# Patient Record
Sex: Female | Born: 1985 | Race: White | Hispanic: No | Marital: Single | Smoking: Current every day smoker
Health system: Southern US, Community
[De-identification: ages and names within clinical notes are randomized; demographics above are authoritative.]

## PROBLEM LIST (undated history)

## (undated) DIAGNOSIS — R569 Unspecified convulsions: Secondary | ICD-10-CM

## (undated) DIAGNOSIS — F1410 Cocaine abuse, uncomplicated: Secondary | ICD-10-CM

## (undated) DIAGNOSIS — I10 Essential (primary) hypertension: Secondary | ICD-10-CM

## (undated) DIAGNOSIS — B182 Chronic viral hepatitis C: Secondary | ICD-10-CM

## (undated) DIAGNOSIS — R011 Cardiac murmur, unspecified: Secondary | ICD-10-CM

## (undated) HISTORY — PX: NO PAST SURGERIES: SHX2092

---
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
## 2003-01-02 ENCOUNTER — Other Ambulatory Visit: Admission: RE | Admit: 2003-01-02 | Discharge: 2003-01-02 | Payer: Self-pay | Admitting: Obstetrics and Gynecology

## 2003-02-21 ENCOUNTER — Emergency Department (HOSPITAL_COMMUNITY): Admission: EM | Admit: 2003-02-21 | Discharge: 2003-02-22 | Payer: Self-pay | Admitting: Emergency Medicine

## 2003-02-21 ENCOUNTER — Encounter: Payer: Self-pay | Admitting: Emergency Medicine

## 2003-02-22 ENCOUNTER — Encounter: Payer: Self-pay | Admitting: Emergency Medicine

## 2003-04-27 ENCOUNTER — Encounter: Payer: Self-pay | Admitting: Emergency Medicine

## 2003-04-27 ENCOUNTER — Emergency Department (HOSPITAL_COMMUNITY): Admission: EM | Admit: 2003-04-27 | Discharge: 2003-04-27 | Payer: Self-pay | Admitting: Emergency Medicine

## 2003-08-14 ENCOUNTER — Other Ambulatory Visit: Admission: RE | Admit: 2003-08-14 | Discharge: 2003-08-14 | Payer: Self-pay | Admitting: Obstetrics and Gynecology

## 2003-10-16 ENCOUNTER — Inpatient Hospital Stay (HOSPITAL_COMMUNITY): Admission: AD | Admit: 2003-10-16 | Discharge: 2003-10-16 | Payer: Self-pay | Admitting: Obstetrics and Gynecology

## 2003-11-25 ENCOUNTER — Inpatient Hospital Stay (HOSPITAL_COMMUNITY): Admission: AD | Admit: 2003-11-25 | Discharge: 2003-11-25 | Payer: Self-pay | Admitting: Obstetrics and Gynecology

## 2003-12-29 ENCOUNTER — Inpatient Hospital Stay (HOSPITAL_COMMUNITY): Admission: AD | Admit: 2003-12-29 | Discharge: 2003-12-29 | Payer: Self-pay | Admitting: Obstetrics and Gynecology

## 2004-01-07 ENCOUNTER — Ambulatory Visit (HOSPITAL_COMMUNITY): Admission: RE | Admit: 2004-01-07 | Discharge: 2004-01-07 | Payer: Self-pay | Admitting: Obstetrics and Gynecology

## 2004-01-22 ENCOUNTER — Inpatient Hospital Stay (HOSPITAL_COMMUNITY): Admission: AD | Admit: 2004-01-22 | Discharge: 2004-01-22 | Payer: Self-pay | Admitting: Obstetrics and Gynecology

## 2004-02-04 ENCOUNTER — Inpatient Hospital Stay (HOSPITAL_COMMUNITY): Admission: AD | Admit: 2004-02-04 | Discharge: 2004-02-04 | Payer: Self-pay | Admitting: Obstetrics and Gynecology

## 2004-02-14 ENCOUNTER — Inpatient Hospital Stay (HOSPITAL_COMMUNITY): Admission: AD | Admit: 2004-02-14 | Discharge: 2004-02-14 | Payer: Self-pay | Admitting: Obstetrics and Gynecology

## 2004-02-17 ENCOUNTER — Inpatient Hospital Stay (HOSPITAL_COMMUNITY): Admission: AD | Admit: 2004-02-17 | Discharge: 2004-02-20 | Payer: Self-pay | Admitting: Obstetrics and Gynecology

## 2004-03-20 ENCOUNTER — Other Ambulatory Visit: Admission: RE | Admit: 2004-03-20 | Discharge: 2004-03-20 | Payer: Self-pay | Admitting: Obstetrics and Gynecology

## 2004-07-01 ENCOUNTER — Emergency Department (HOSPITAL_COMMUNITY): Admission: EM | Admit: 2004-07-01 | Discharge: 2004-07-01 | Payer: Self-pay | Admitting: Emergency Medicine

## 2004-07-04 ENCOUNTER — Emergency Department (HOSPITAL_COMMUNITY): Admission: EM | Admit: 2004-07-04 | Discharge: 2004-07-04 | Payer: Self-pay | Admitting: Emergency Medicine

## 2004-07-07 ENCOUNTER — Encounter: Payer: Self-pay | Admitting: Emergency Medicine

## 2004-07-07 ENCOUNTER — Emergency Department (HOSPITAL_COMMUNITY): Admission: EM | Admit: 2004-07-07 | Discharge: 2004-07-07 | Payer: Self-pay | Admitting: Emergency Medicine

## 2004-09-06 ENCOUNTER — Emergency Department (HOSPITAL_COMMUNITY): Admission: EM | Admit: 2004-09-06 | Discharge: 2004-09-06 | Payer: Self-pay | Admitting: Emergency Medicine

## 2004-09-08 ENCOUNTER — Emergency Department (HOSPITAL_COMMUNITY): Admission: EM | Admit: 2004-09-08 | Discharge: 2004-09-08 | Payer: Self-pay | Admitting: Emergency Medicine

## 2004-09-11 ENCOUNTER — Emergency Department (HOSPITAL_COMMUNITY): Admission: EM | Admit: 2004-09-11 | Discharge: 2004-09-11 | Payer: Self-pay | Admitting: Family Medicine

## 2005-01-05 ENCOUNTER — Emergency Department (HOSPITAL_COMMUNITY): Admission: EM | Admit: 2005-01-05 | Discharge: 2005-01-05 | Payer: Self-pay | Admitting: Emergency Medicine

## 2005-01-05 ENCOUNTER — Other Ambulatory Visit: Admission: RE | Admit: 2005-01-05 | Discharge: 2005-01-05 | Payer: Self-pay | Admitting: Obstetrics and Gynecology

## 2005-02-22 ENCOUNTER — Observation Stay (HOSPITAL_COMMUNITY): Admission: AD | Admit: 2005-02-22 | Discharge: 2005-02-23 | Payer: Self-pay | Admitting: Obstetrics and Gynecology

## 2005-04-02 ENCOUNTER — Ambulatory Visit (HOSPITAL_COMMUNITY): Admission: RE | Admit: 2005-04-02 | Discharge: 2005-04-02 | Payer: Self-pay | Admitting: Obstetrics and Gynecology

## 2005-06-20 IMAGING — US US ABDOMEN COMPLETE
1 series · 14 of 25 positions shown · non-contrast
Comparison: none

CLINICAL DATA: 18-year-old with right lower quadrant pain for one month.  Patient is 11 weeks pregnant.
 ABDOMEN ULTRASOUND:
TECHNIQUE: Complete abdominal ultrasound examination was performed including evaluation of the liver, gallbladder, bile ducts, pancreas, kidneys, spleen, IVC, and abdominal aorta.

[Series 1: us abdomen complete · 0.39mm/px · 14 of 58 slices shown]
[im 1/58]
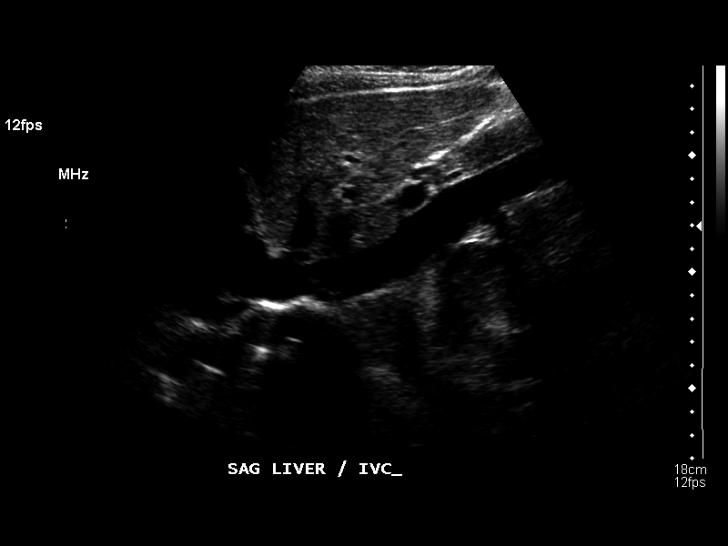
[im 5/58]
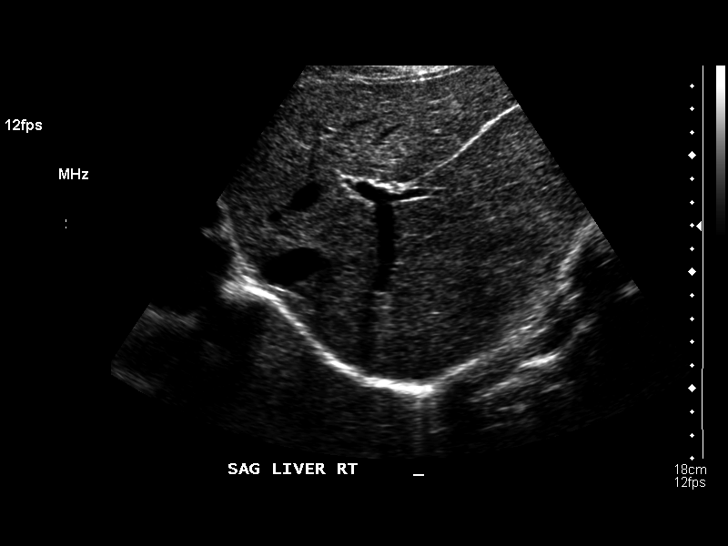
[im 10/58]
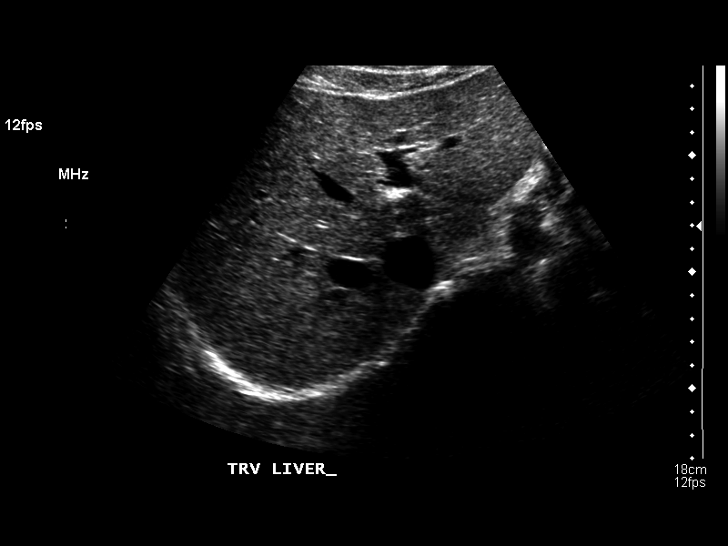
[im 15/58]
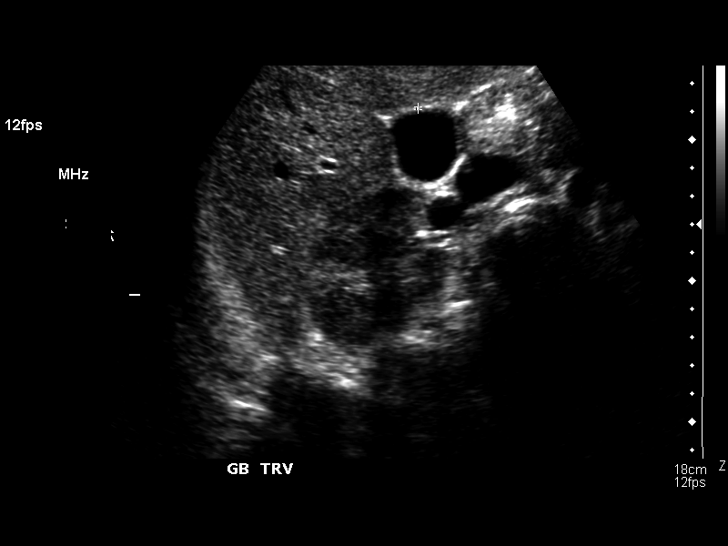
[im 20/58]
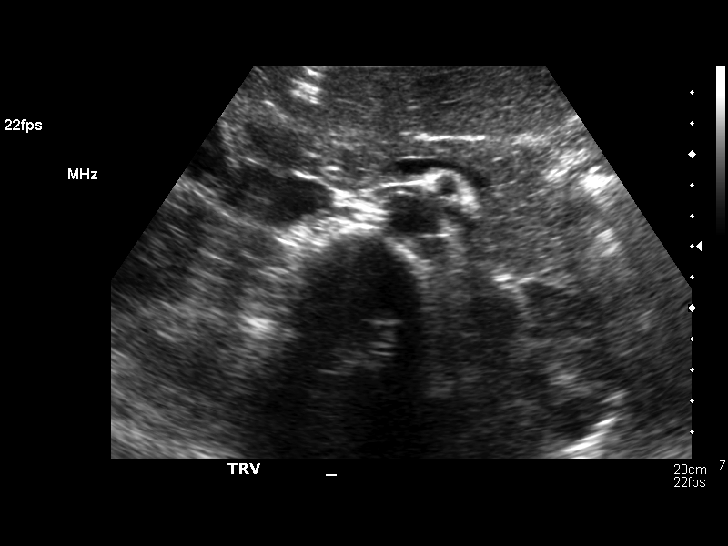
[im 22/58]
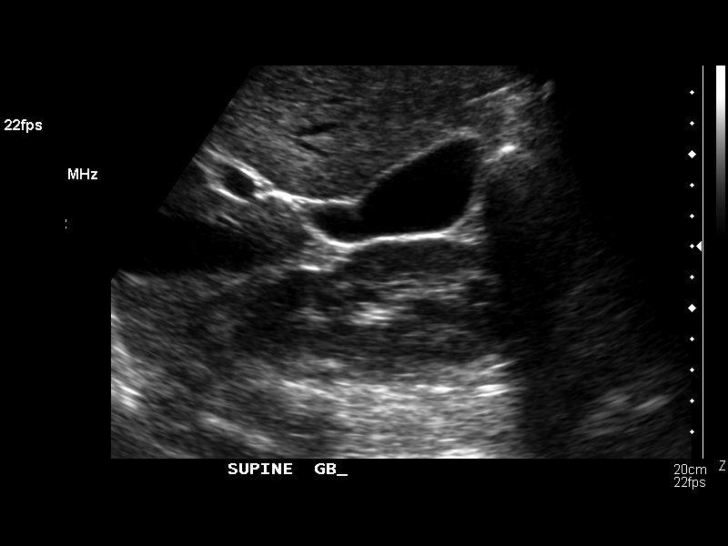
[im 27/58]
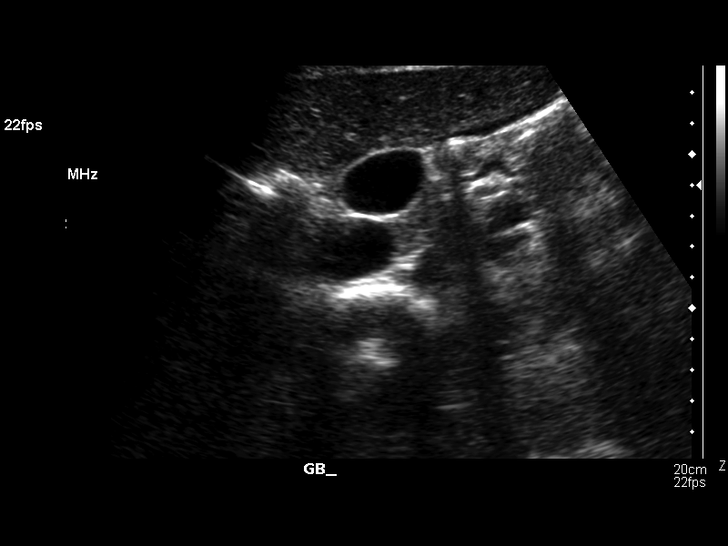
[im 31/58]
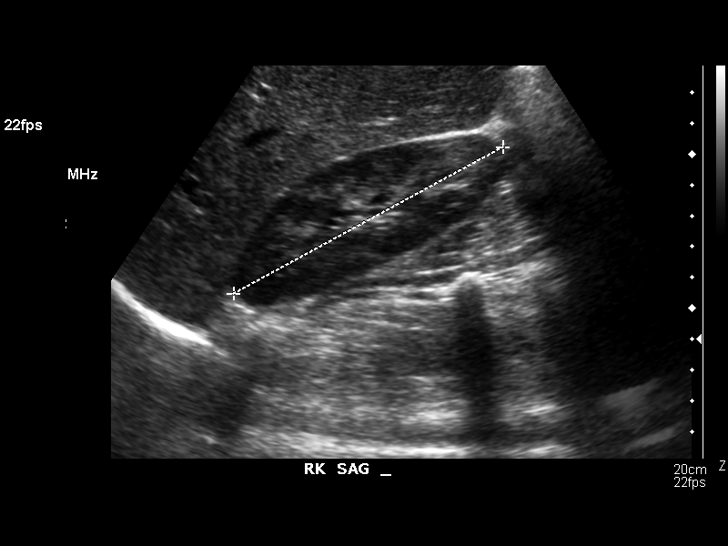
[im 36/58]
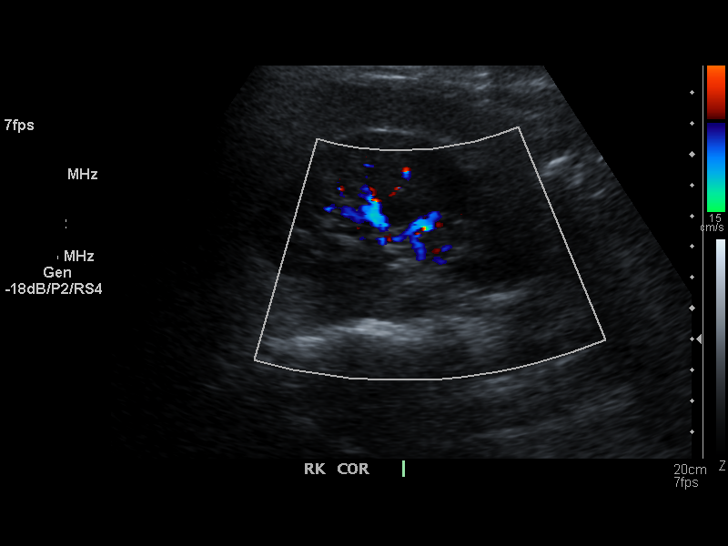
[im 39/58]
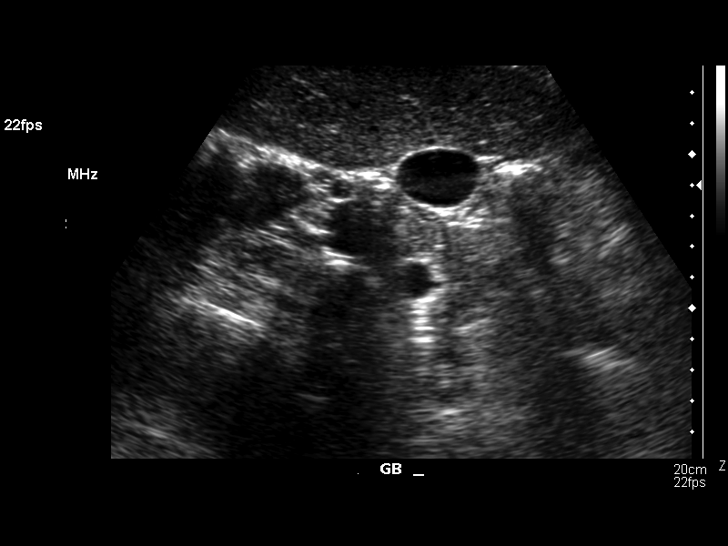
[im 43/58]
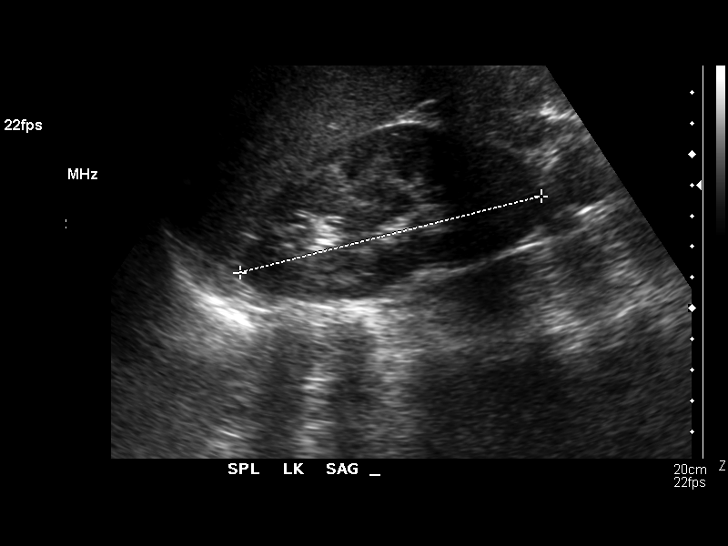
[im 48/58]
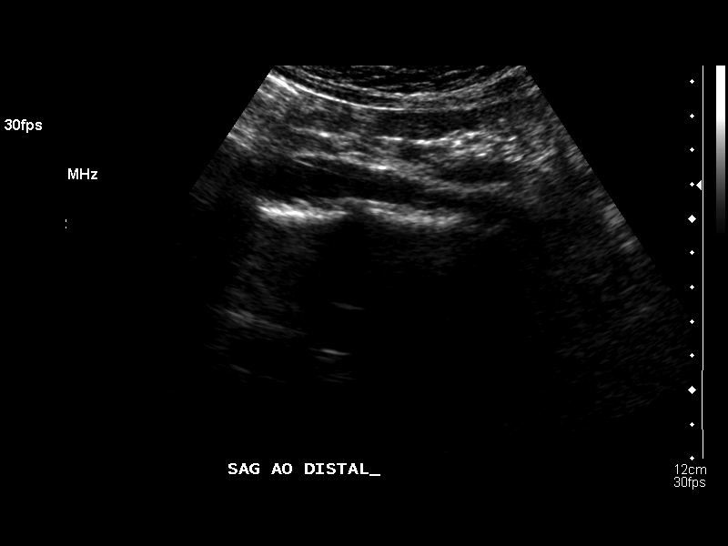
[im 53/58]
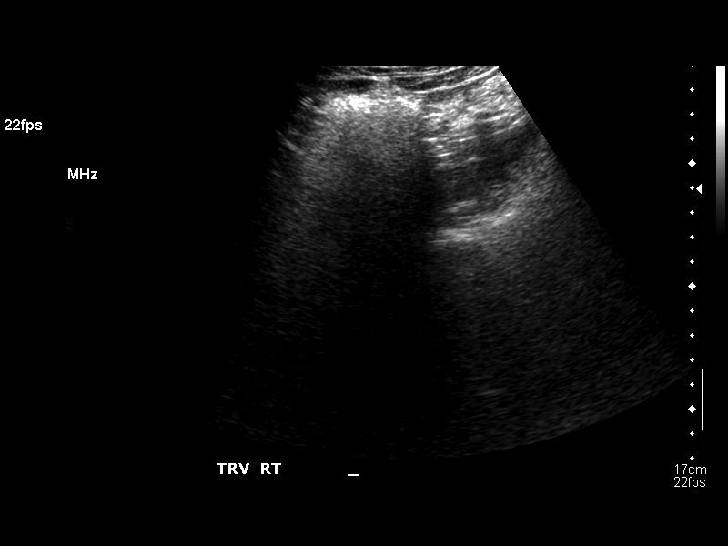
[im 58/58]
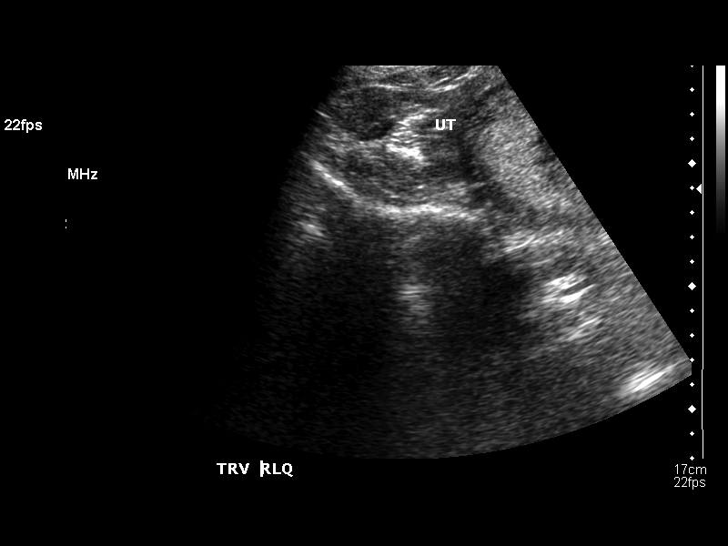

[14 of 25 positions shown; findings below may reference images not displayed]

FINDINGS: There is no evidence of gallstones or biliary ductal dilatation.  The liver is within normal limits in echogenicity, and no focal liver lesions are seen.  The visualized portions of the IVC and pancreas are unremarkable.
 There is no evidence of splenomegaly.  The kidneys are unremarkable, and there is no evidence of hydronephrosis.  The abdominal aorta is non-dilated.
 Transabdominal evaluation of the right lower quadrant is unremarkable.  The gravid uterus was not evaluated.
IMPRESSION: Negative abdominal ultrasound.

## 2005-07-16 ENCOUNTER — Other Ambulatory Visit: Admission: RE | Admit: 2005-07-16 | Discharge: 2005-07-16 | Payer: Self-pay | Admitting: Obstetrics and Gynecology

## 2005-08-17 ENCOUNTER — Inpatient Hospital Stay (HOSPITAL_COMMUNITY): Admission: AD | Admit: 2005-08-17 | Discharge: 2005-08-17 | Payer: Self-pay | Admitting: Obstetrics and Gynecology

## 2005-10-02 ENCOUNTER — Inpatient Hospital Stay (HOSPITAL_COMMUNITY): Admission: AD | Admit: 2005-10-02 | Discharge: 2005-10-02 | Payer: Self-pay | Admitting: Obstetrics and Gynecology

## 2005-10-05 ENCOUNTER — Inpatient Hospital Stay (HOSPITAL_COMMUNITY): Admission: AD | Admit: 2005-10-05 | Discharge: 2005-10-06 | Payer: Self-pay | Admitting: Obstetrics and Gynecology

## 2005-11-26 ENCOUNTER — Other Ambulatory Visit: Admission: RE | Admit: 2005-11-26 | Discharge: 2005-11-26 | Payer: Self-pay | Admitting: Obstetrics and Gynecology

## 2006-06-17 ENCOUNTER — Emergency Department (HOSPITAL_COMMUNITY): Admission: EM | Admit: 2006-06-17 | Discharge: 2006-06-17 | Payer: Self-pay | Admitting: Emergency Medicine

## 2006-09-27 ENCOUNTER — Emergency Department (HOSPITAL_COMMUNITY): Admission: EM | Admit: 2006-09-27 | Discharge: 2006-09-27 | Payer: Self-pay | Admitting: Family Medicine

## 2006-10-08 ENCOUNTER — Emergency Department (HOSPITAL_COMMUNITY): Admission: EM | Admit: 2006-10-08 | Discharge: 2006-10-08 | Payer: Self-pay | Admitting: Family Medicine

## 2006-12-04 ENCOUNTER — Emergency Department (HOSPITAL_COMMUNITY): Admission: EM | Admit: 2006-12-04 | Discharge: 2006-12-04 | Payer: Self-pay | Admitting: Emergency Medicine

## 2006-12-28 ENCOUNTER — Inpatient Hospital Stay (HOSPITAL_COMMUNITY): Admission: AD | Admit: 2006-12-28 | Discharge: 2006-12-28 | Payer: Self-pay | Admitting: Obstetrics & Gynecology

## 2006-12-28 ENCOUNTER — Ambulatory Visit: Payer: Self-pay | Admitting: Family Medicine

## 2007-02-06 ENCOUNTER — Emergency Department (HOSPITAL_COMMUNITY): Admission: EM | Admit: 2007-02-06 | Discharge: 2007-02-06 | Payer: Self-pay | Admitting: Family Medicine

## 2007-03-12 ENCOUNTER — Emergency Department (HOSPITAL_COMMUNITY): Admission: EM | Admit: 2007-03-12 | Discharge: 2007-03-12 | Payer: Self-pay | Admitting: Emergency Medicine

## 2007-05-08 ENCOUNTER — Emergency Department (HOSPITAL_COMMUNITY): Admission: EM | Admit: 2007-05-08 | Discharge: 2007-05-08 | Payer: Self-pay | Admitting: Emergency Medicine

## 2007-05-30 ENCOUNTER — Emergency Department (HOSPITAL_COMMUNITY): Admission: EM | Admit: 2007-05-30 | Discharge: 2007-05-31 | Payer: Self-pay | Admitting: *Deleted

## 2007-06-06 ENCOUNTER — Emergency Department (HOSPITAL_COMMUNITY): Admission: EM | Admit: 2007-06-06 | Discharge: 2007-06-06 | Payer: Self-pay | Admitting: Emergency Medicine

## 2007-07-05 ENCOUNTER — Emergency Department (HOSPITAL_COMMUNITY): Admission: EM | Admit: 2007-07-05 | Discharge: 2007-07-05 | Payer: Self-pay | Admitting: Family Medicine

## 2007-08-10 ENCOUNTER — Emergency Department (HOSPITAL_COMMUNITY): Admission: EM | Admit: 2007-08-10 | Discharge: 2007-08-10 | Payer: Self-pay | Admitting: Emergency Medicine

## 2007-09-19 ENCOUNTER — Inpatient Hospital Stay (HOSPITAL_COMMUNITY): Admission: AD | Admit: 2007-09-19 | Discharge: 2007-09-19 | Payer: Self-pay | Admitting: Obstetrics and Gynecology

## 2007-09-22 IMAGING — CR DG WRIST 2V*R*
1 series · 1 of 1 positions shown · non-contrast
Comparison: none

CLINICAL DATA: Right wrist pain for a week.  
 RIGHT WRIST ? 2 VIEW:

[view not recorded]
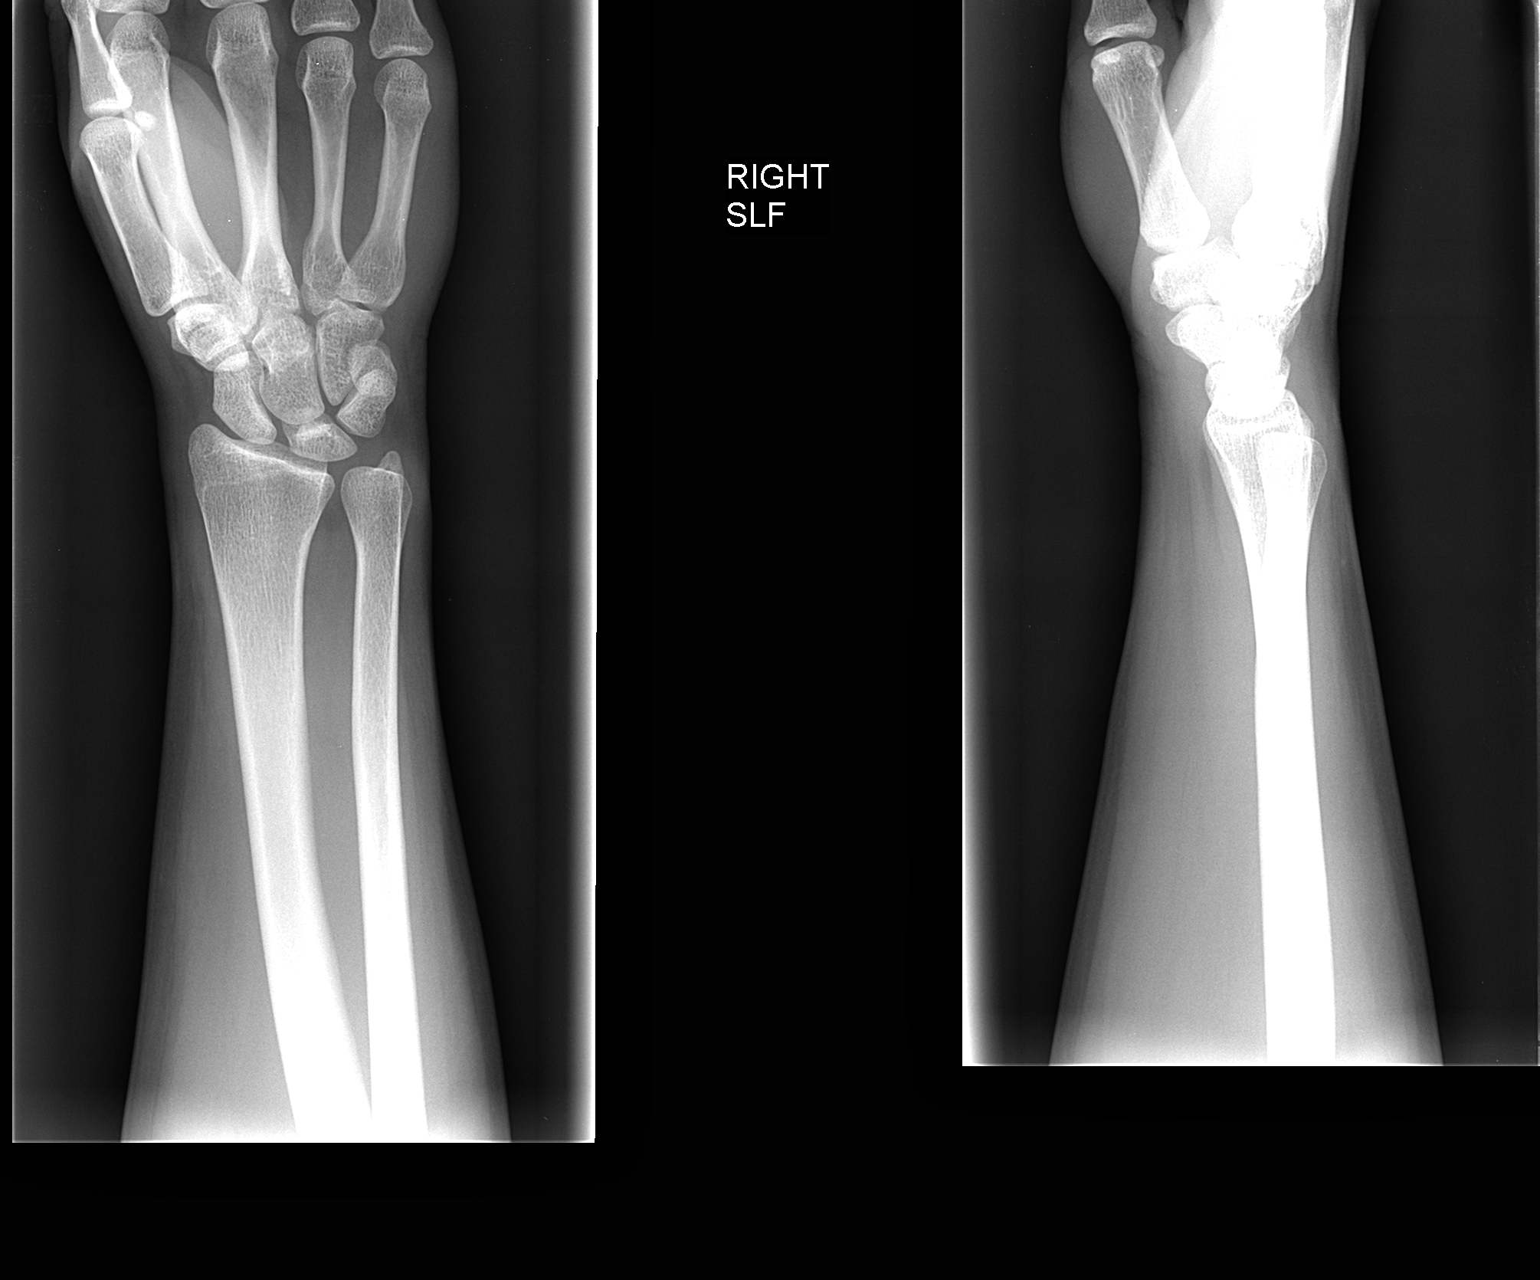

[1 of 1 positions shown; findings below may reference images not displayed]

FINDINGS: Two views of the right wrist show no acute abnormality.  The radiocarpal joint space appears normal and the carpal bones are in normal position.
IMPRESSION: Negative.

## 2007-12-19 ENCOUNTER — Ambulatory Visit (HOSPITAL_COMMUNITY): Admission: RE | Admit: 2007-12-19 | Discharge: 2007-12-19 | Payer: Self-pay | Admitting: Obstetrics and Gynecology

## 2008-02-05 ENCOUNTER — Encounter: Payer: Self-pay | Admitting: Obstetrics and Gynecology

## 2008-02-05 ENCOUNTER — Inpatient Hospital Stay (HOSPITAL_COMMUNITY): Admission: AD | Admit: 2008-02-05 | Discharge: 2008-02-05 | Payer: Self-pay | Admitting: Obstetrics and Gynecology

## 2008-03-03 ENCOUNTER — Inpatient Hospital Stay (HOSPITAL_COMMUNITY): Admission: AD | Admit: 2008-03-03 | Discharge: 2008-03-03 | Payer: Self-pay | Admitting: Obstetrics and Gynecology

## 2008-04-24 IMAGING — US US OB DETAIL+14 WK
1 series · 14 of 28 positions shown · non-contrast
Comparison: none

OBSTETRICAL ULTRASOUND:
 This ultrasound was performed in The [HOSPITAL], and the [facility] report will be stored to [REDACTED] PACS.

[Series 1: us ob detail+14 wk · 14 of 85 slices shown]
[im 4/85]
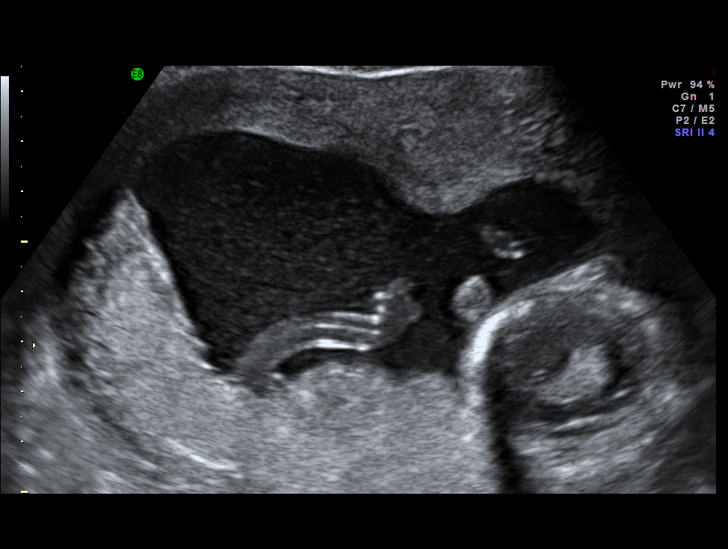
[im 10/85]
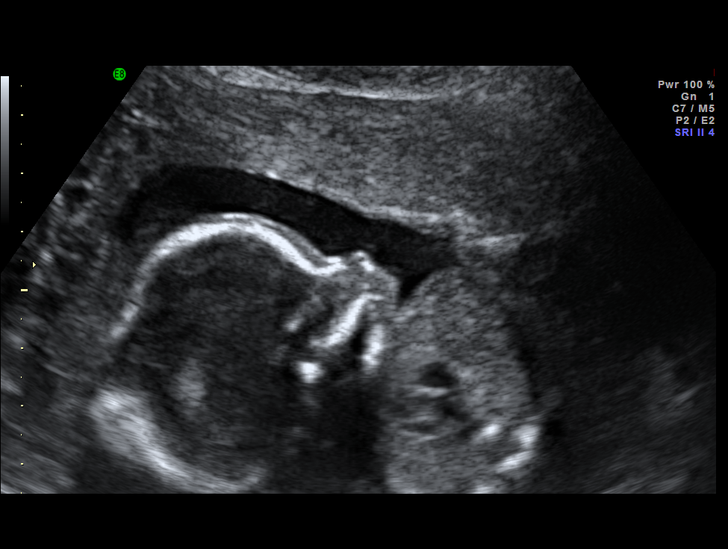
[im 16/85]
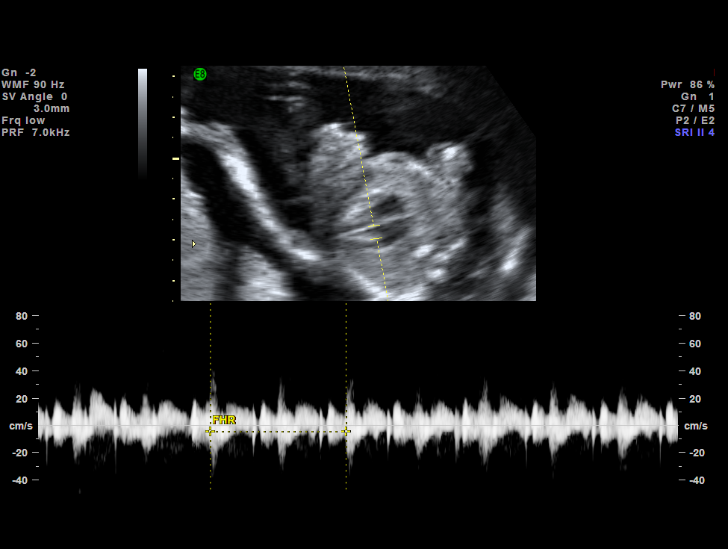
[im 22/85]
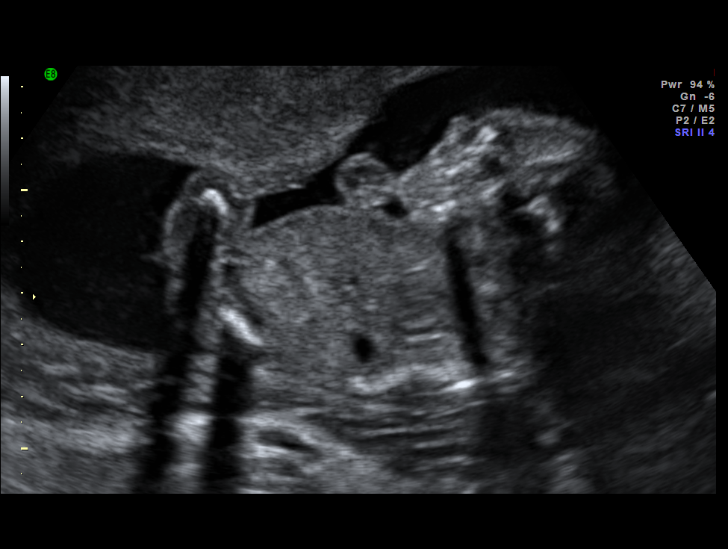
[im 29/85]
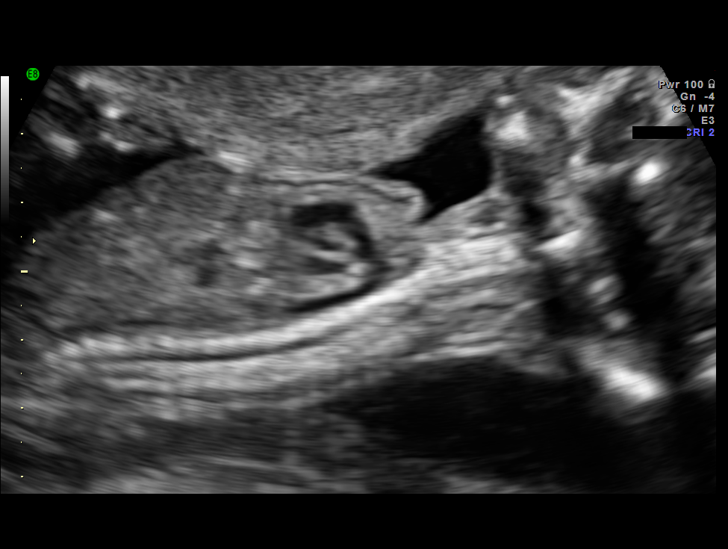
[im 35/85]
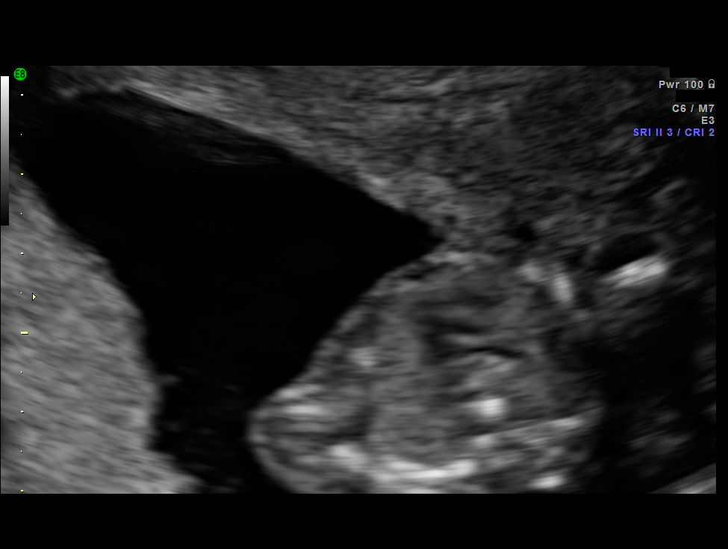
[im 41/85]
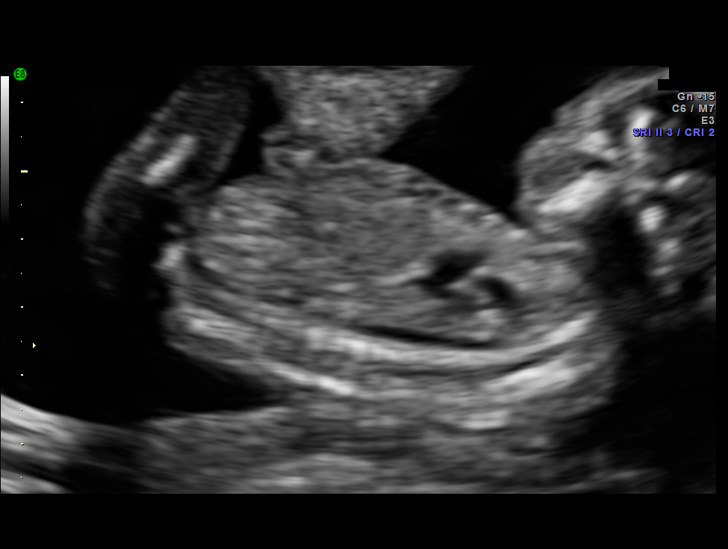
[im 47/85]
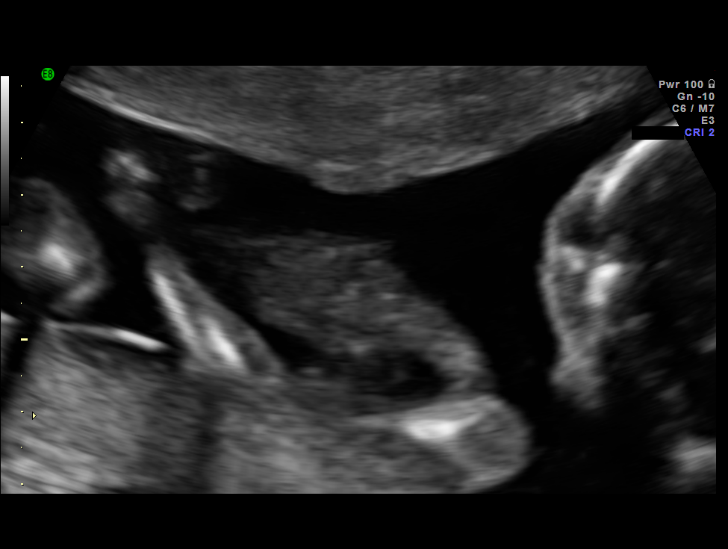
[im 53/85]
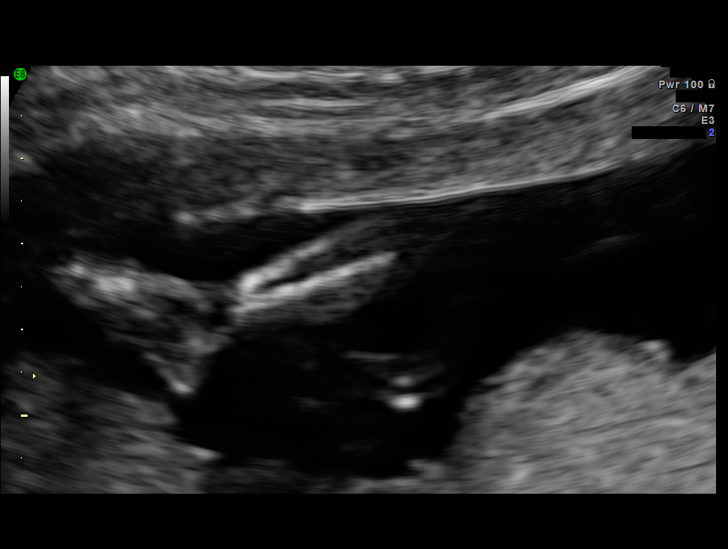
[im 60/85]
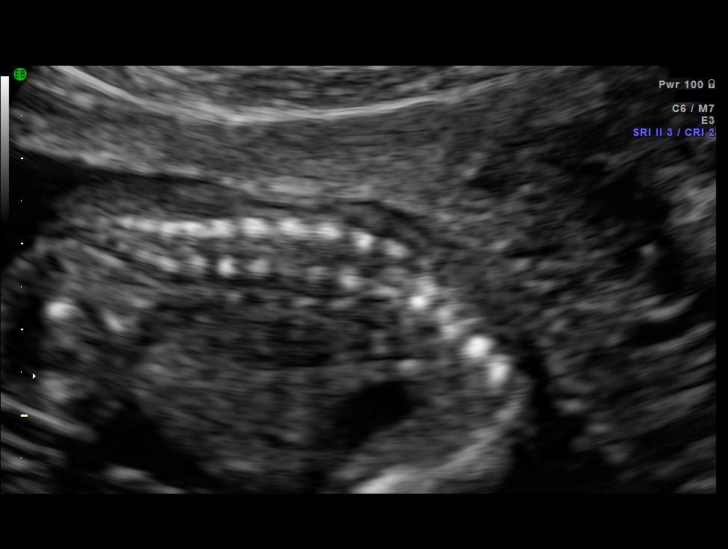
[im 66/85]
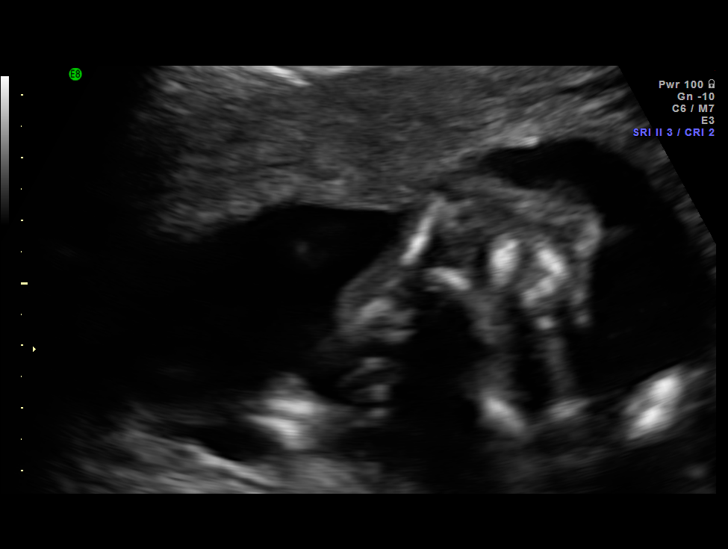
[im 72/85]
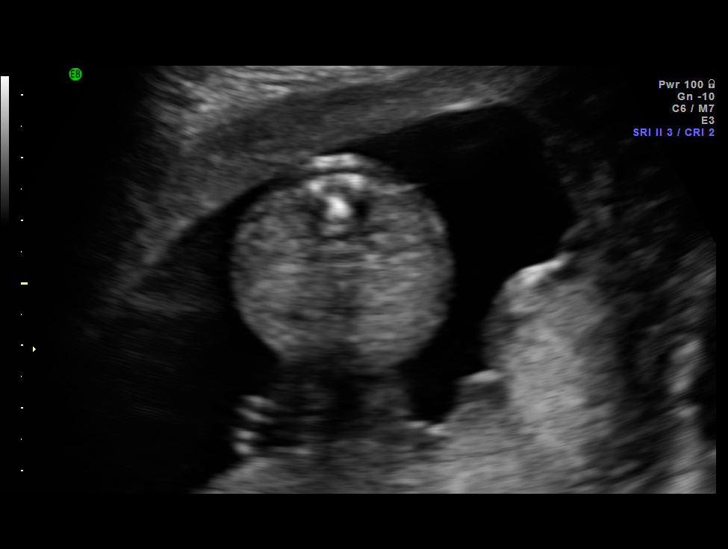
[im 78/85]
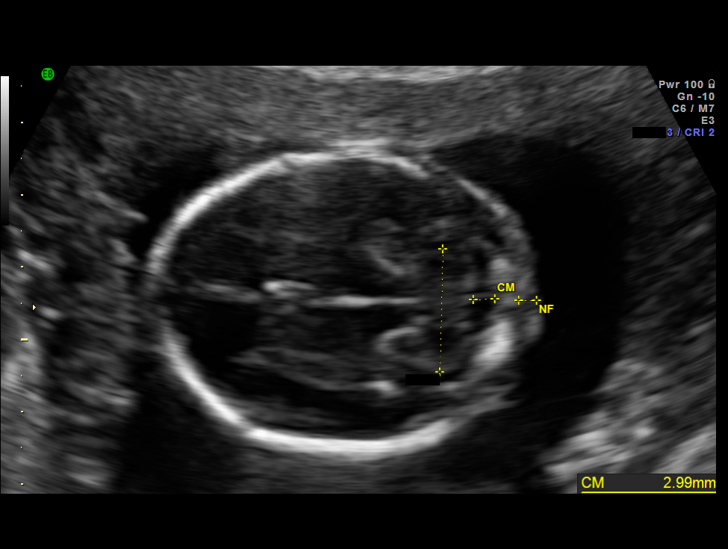
[im 85/85]
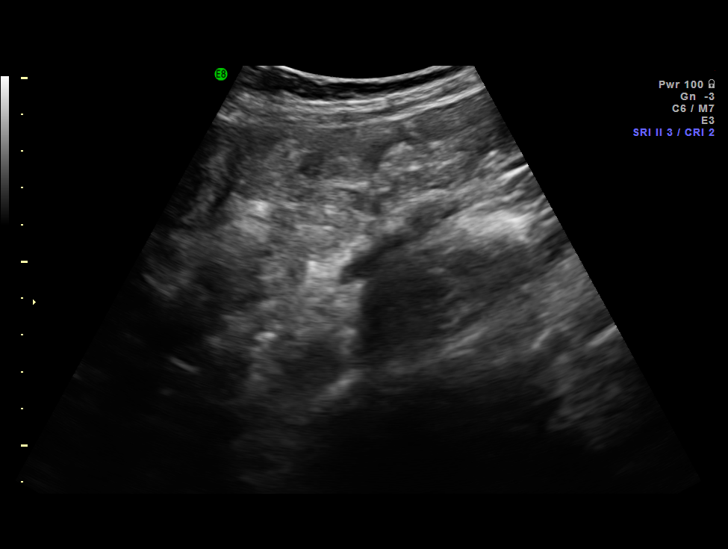

[14 of 28 positions shown; findings below may reference images not displayed]

IMPRESSION: [facility] has also been faxed to the ordering physician.

## 2008-06-09 ENCOUNTER — Inpatient Hospital Stay (HOSPITAL_COMMUNITY): Admission: AD | Admit: 2008-06-09 | Discharge: 2008-06-09 | Payer: Self-pay | Admitting: Obstetrics and Gynecology

## 2008-06-29 ENCOUNTER — Inpatient Hospital Stay (HOSPITAL_COMMUNITY): Admission: AD | Admit: 2008-06-29 | Discharge: 2008-07-02 | Payer: Self-pay | Admitting: Obstetrics and Gynecology

## 2009-12-11 ENCOUNTER — Emergency Department (HOSPITAL_BASED_OUTPATIENT_CLINIC_OR_DEPARTMENT_OTHER): Admission: EM | Admit: 2009-12-11 | Discharge: 2009-12-11 | Payer: Self-pay | Admitting: Emergency Medicine

## 2010-11-15 NOTE — L&D Delivery Note (Signed)
Delivery Note At 2:40 AM a viable female was delivered via  (Presentation:LOA ;  ).  APGAR: , ; weight 5-5.   Placenta status: spont, .  Cord:3 vc  with the following complications:none .    Anesthesia:  epidural Episiotomy: none Lacerations: none Suture Repair:none Est. Blood Loss350 (mL):   Mom to postpartum.  Baby to nursery-stable.  [name] 08/09/2011, 2:50 AM

## 2011-02-03 ENCOUNTER — Inpatient Hospital Stay (HOSPITAL_COMMUNITY)
Admission: AD | Admit: 2011-02-03 | Discharge: 2011-02-03 | Disposition: A | Discharge status: Home / Self Care | Payer: Self-pay | Source: Ambulatory Visit | Attending: Obstetrics and Gynecology | Admitting: Obstetrics and Gynecology

## 2011-02-03 DIAGNOSIS — O210 Mild hyperemesis gravidarum: Secondary | ICD-10-CM

## 2011-02-03 LAB — URINALYSIS, ROUTINE W REFLEX MICROSCOPIC
Glucose, UA: NEGATIVE mg/dL
Protein, ur: NEGATIVE mg/dL
pH: 6.5 (ref 5.0–8.0)

## 2011-03-24 ENCOUNTER — Observation Stay: Payer: Self-pay

## 2011-03-30 NOTE — Discharge Summary (Signed)
[name], [name] NO.:  [id number]   MEDICAL RECORD NO.:  [id number]          PATIENT TYPE:  INP   LOCATION:  9127                          FACILITY:  [facility]   PHYSICIAN:  Malachi Pro. Ambrose Mantle, M.D. DATE OF BIRTH:  Aug 15, 1986   DATE OF ADMISSION:  06/30/2008  DATE OF DISCHARGE:  07/02/2008                               DISCHARGE SUMMARY   A 25 year old white single female para 2-0-3-2, gravida 27, EDC July 08, 2008, admitted in early labor.  Blood group and type A positive,  negative antibody, nonreactive serology, rubella immune, hepatitis B  surface antigen negative, HIV negative, GC and chlamydia negative,  cystic fibrosis negative, and group B strep negative.  Vaginal  ultrasound on December 08, 2007, crown-rump length measured 9 weeks and 4  days, [facility] July 08, 2008, with increased nuchal translucency.  Ultrasound at [facility] on December 19, 2007, showed a  normal nuchal translucency.  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  She has been on Valtrex since 36 weeks with  no outbreaks.  She began contracting on June 29, 2008, and came here  for evaluation.  She was admitted with the cervix 3 cm and made no  progress.  She received Pitocin and an epidural.  She was 4 cm at 6:28  a.m. and then has spontaneous rupture of membranes and progress rapidly  to full diltation.   Allergies to AMOXICILLIN; causes diarrhea.   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   OPERATIONS:  None.   Alcohol, tobacco, and drugs none.   FAMILY HISTORY:  Mother with COPD, diabetes, and epilepsy.  Father with  high blood pressure, depression, and alcoholism.  Maternal grandmother  with high blood pressure, heart disease, uterine cancer, and thyroid  problem.   OBSTETRIC HISTORY:  In 2003 and 2004, the patient has spontaneous  abortions.  In April 2005, she had a 5-pound 13-ounce female with 36+  weeks after preterm  premature rupture of membranes.  In November 2006,  she delivered a 6-pound 8-ounce female at 65 weeks with transposition of  the great vessels.  In 2008, she had a spontaneous abortion.   Physical exam on admission revealed vital signs to include a blood  pressure of 143/98; otherwise they were normal.  Heart and lungs were  normal.  The abdomen was soft and nontender.  Fetal heart tone were  normal.  The cervix was 10 cm.  Vertex at a +1 station.   The patient did have hypokalemia.  She had normal PIH labs other than  the low potassium.  She pushed well and delivered spontaneously OA with  an intact perineum by Dr. Ambrose Mantle, living female infant 6 pounds 5 ounces  with Apgars of 9 at 1 and 9 at 5 minutes.  Placenta was intact.  Uterus  was normal.  Blood loss about 400 mL.  There were no lacerations.  Postpartum, the patient did quite well and was ready for discharge on  the second postpartum day.   Initial hemoglobin was 10.6, hematocrit 32.0,  white count 17,800, and  platelet count 374,000.  Followup hemoglobin 8.7, hematocrit 26.1.  Potassium was 2.8 on admission, and 3.2 at discharge.  Urinalysis was  negative.  No proteinuria was found.  SGOT and PT were 19 and 10.  LDH  was 187.  Uric acid 3.9.  RPR was nonreactive.   FINAL DIAGNOSES:  1. Intrauterine pregnancy at 39 weeks, delivered occipitoanterior.  2. Hypokalemia.   OPERATION:  Spontaneous delivery occipitoanterior.   FINAL CONDITION:  Improved.   INSTRUCTIONS:  Include a regular discharge instruction booklet.  The  patient is advised to return to the office in 6 weeks for followup  examination.  Percocet 5/325 30 tablets 1 every 4-6 hours as needed for  pain and Motrin 600 mg 30 tablets 1 every 6 hours as needed for pain.  The patient is also given a prescription for K-Tab 1 by mouth 3 times a  day for 2 weeks.  She is to return to the office in 6 weeks.      Malachi Pro. Ambrose Mantle, M.D.  Electronically Signed      TFH/MEDQ  D:  07/02/2008  T:  07/02/2008  Job:  [id number]

## 2011-04-02 LAB — ABO/RH: RH Type: POSITIVE

## 2011-04-02 LAB — GC/CHLAMYDIA PROBE AMP, GENITAL
Chlamydia: NEGATIVE
Gonorrhea: NEGATIVE

## 2011-04-02 LAB — HIV ANTIBODY (ROUTINE TESTING W REFLEX): HIV: NONREACTIVE

## 2011-04-02 LAB — HEPATITIS B SURFACE ANTIGEN: Hepatitis B Surface Ag: NEGATIVE
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
## 2011-04-02 NOTE — Discharge Summary (Signed)
NAME:  [name], [name] NO.:  [id number]   MEDICAL RECORD NO.:  [id number]          PATIENT TYPE:  INP   LOCATION:  9114                          [facility]   PHYSICIAN:  Malachi Pro. Ambrose Mantle, M.D. DATE OF BIRTH:  [birth year]   DATE OF ADMISSION:  10/05/2005  DATE OF DISCHARGE:  10/06/2005                                 DISCHARGE SUMMARY   A 25 year old white single female para 0-1-2-1, gravida 4, EDC October 19, 2005 by 7-week ultrasound admitted in early labor.  Blood group and type A+.  Negative antibody.  Nonreactive serology.  Rubella immune.  Hepatitis B  surface antigen negative.  HIV negative.  GC and Chlamydia negative.  Pap  smear low-grade SIL.  One-hour Glucola 98.  Group B Strep negative.  Vaginal  ultrasound on March 03, 2005 crown rump length 0.99 cm, 7 weeks 0 days, [facility]  October 19, 2005.  Patient was treated with Flexeril at 11 weeks.  She also  received Phenergan, Zyrtec, and Zantac in early pregnancy.  On September 16, 2005 [facility] laboratories were normal.  Blood pressure has remained high.  She  was seen in the office on the day of admission.  Cervix was 1-2 cm.  Later  on the day of admission she returned with possible rupture of membranes.  Crist Fat was negative but the cervix was 3-4 cm.   PAST MEDICAL HISTORY:  Allergy to AMOXICILLIN, caused diarrhea.   ILLNESSES:  1.  HPV.  2.  CIN I-II.  3.  History of seizures not confirmed by neurologist report.  4.  High blood pressure.   OPERATIONS:  None.   FAMILY HISTORY:  Mother with diabetes, neurologic problem.  Maternal  grandparents with heart disease.  Father with high blood pressure and  depression.   OBSTETRIC HISTORY:  In 2003 and 2004 spontaneous abortions.  In April of  2005 a 5 pound 13 ounce female at 36+ weeks with preterm premature rupture  of the membranes and high blood pressure.   PHYSICAL EXAMINATION:  VITAL SIGNS:  On admission her blood pressure was  140/103, but later was  141/85.  Temperature was 98.3, pulse 110,  respirations 18.  HEART:  Normal sinus tachycardia, no murmurs.  LUNGS:  Clear to auscultation.  ABDOMEN:  Soft.  Fundal height 37 cm.  Fetal heart tones normal.  PELVIC:  Cervix 4 cm, 70%, vertex at a -2.  Artificial rupture of the  membranes produced clear fluid.   Patient received an epidural, then made no progress so Pitocin was begun.  At 9:32 p.m. the Pitocin was at 6 milliunits a minute.  Contractions were  every two minutes.  Epidural was very effective.  Cervix was 5+ cm, 100%.  I  was in the hospital signing charts in the record room when I heard an  overhead page for me.  I came immediately to the delivery room but the baby  was already delivered by the nurse.  My beeper was on vibrate but I did not  feel a vibration.  I delivered the placenta, repaired a first degree  laceration, and palpated  the uterus and rectum.  There were no  abnormalities.  Cord was preserved for banking.  Blood loss about 400 mL.  Infant was female 6 pounds 8 ounces.  Apgars were given by the nurses at 9 at  one and 9 at five minutes.  Shortly after arriving in the nursery, however,  the baby's O2 saturation was quite low and it was transferred to the NICU  where it was intubated, kept on 100% oxygen initially.  Echocardiogram  showed transposition of the great vessels.  The baby has been transferred to  [facility].  The patient wants to be discharged.  There are two items  that are of significance as far as her health goes.  Her blood pressure is  159/99.  However, she has a history of high blood pressure and she has  normal PIH laboratories.  Her hemoglobin 10.8 on admission, hematocrit 33.3,  white count 15,400, platelet 453,000.  Follow-up hematocrits were 28.5 and  24.5.  Potassium on admission was 2.3.  After receiving some IV potassium  she is at 2.8 on the morning of discharge.  Her SGOT and PT were 20 and 11.  Total bilirubin 0.6, creatinine 0.5.   RPR nonreactive.   FINAL DIAGNOSES:  1.  Intrauterine pregnancy at 38 weeks delivered vertex.  2.  Anemia.  3.  High blood pressure.  4.  Hypopotassemia.   OPERATION:  1.  Spontaneous delivery vertex.  2.  Repair of first degree laceration.   The patient is discharged on potassium supplementation.  She is advised to  take iron to help build her blood count back to normal.  She is to report  either to [facility] or here if she develops severe headache, severe  epigastric pain, or any problems that could be related to high blood  pressure.  The patient is to increase her potassium supplementation from two  tablets a day to three tablets a day and return to the office in five days  for follow-up examination.      Malachi Pro. Ambrose Mantle, M.D.  Electronically Signed     TFH/MEDQ  D:  10/06/2005  T:  10/06/2005  Job:  [id number]

## 2011-04-02 NOTE — H&P (Signed)
NAMEGUYLA, BLESS NO.:  [id number]   MEDICAL RECORD NO.:  [id number]          PATIENT TYPE:  INP   LOCATION:  9319                          [facility]ACILITY[facility]  [facility]   PHYSICIAN:  Lorre Munroe., M.D.DATE OF BIRTH:  05/28/1986   DATE OF ADMISSION:  02/23/2005  DATE OF DISCHARGE:                                HISTORY & PHYSICAL   CHIEF COMPLAINT:  Abdominal pain.   HISTORY OF PRESENT ILLNESS:  This is a generally healthy 25 year old white  female who is about six weeks pregnant and had acute onset of upper and mid  abdominal pain approximately 24 hours ago.  During the day yesterday, she  had progressive anorexia, nausea and increasing pain.  The pain subsequently  settled in the right lower quadrant of the abdomen, has remained there and  has remained severe.  She has not had diarrhea.  She has not had a definite  fever.  She said she did not break out in a sweat, but she felt that was due  to the pain.  She has confirmed intrauterine pregnancy by ultrasound and  beta HCG.  She has never had a similar pain or GI upset in the past.  No one  else that she knows has similar illness.  White count was 15,500 with a  marked left shift.  There is no abnormality of urinalysis noted.  Amylase  and lipase normal.  Dr. Ambrose Mantle saw the patient and said he felt there was no  obvious GYN or obstetric cause for her pain, asked me to see her thinking  she might have appendicitis.   PAST MEDICAL HISTORY:  1.  Generally good health.  2.  History of seizures, exact cause never determined.  3.  No other serious chronic ailments.   MEDICATIONS:  She is not on chronic medications.   SOCIAL HISTORY:  She smokes about a half a pack of cigarettes a day.  She  has not been drinking alcohol lately.  There is a history of pyelonephritis  in the past.  She has never been operated on.  She is allergic to  penicillin.  That medicine causes hives.  No chronic medications at this  time.   FAMILY HISTORY:  Unremarkable.   CHILDHOOD ILLNESSES:  Unremarkable.   REVIEW OF SYSTEMS:  Negative for any cardiac or pulmonary symptoms.  Negative for GI symptoms other than at this time.   PHYSICAL EXAMINATION:  VITAL SIGNS:  Temperature and vital signs  unremarkable as recorded by nursing staff.  GENERAL APPEARANCE:  No acute distress.  HEENT:  Unremarkable.  No icterus.  No thyromegaly.  No neck mass.  CHEST:  Clear to auscultation.  HEART:  Regular rate and rhythm.  No murmurs, rubs or gallops.  ABDOMEN:  Nondistended, soft in all four quadrants.  Tender in the right  lower quadrant with subjective but rebound tenderness, but no guarding or  spasm of the muscles.  No mass.  No hernia.  Bowel sounds are normal, quite  active.  PELVIC:  Not done.  EXTREMITIES:  Normal.  No edema.  Good pulses.  SKIN:  No lesions are noted.  LYMPH NODES:  None enlarged in the groin or the neck.   IMPRESSION:  Possible acute appendicitis.  I do not think she is quite  tender enough to be certain that it is appendicitis.   PLAN:  Because of very early pregnancy, I would like to avoid a CT scan.  I  would also like to avoid operating on her, but if the tenderness and pain  persist, she will have laparoscopy in a few hours.  I will observe her in  the hospital, keeping her n.p.o. and get CBC in a few hours.  Unless there  is improvement in the white count and the symptoms on exam, will proceed to  laparoscopy and appendectomy.      WB/MEDQ  D:  02/23/2005  T:  02/23/2005  Job:  [id number]   cc:   Malachi Pro. Ambrose Mantle, M.D.  [address]  [address]  [place]  [place] [address]  Fax: [phone]

## 2011-08-08 ENCOUNTER — Encounter (HOSPITAL_COMMUNITY): Payer: Self-pay | Admitting: *Deleted

## 2011-08-08 ENCOUNTER — Inpatient Hospital Stay (HOSPITAL_COMMUNITY)
Admission: AD | Admit: 2011-08-08 | Discharge: 2011-08-11 | DRG: 775 | Disposition: A | Discharge status: Home / Self Care | Payer: Medicaid Other | Source: Ambulatory Visit | Attending: Obstetrics & Gynecology | Admitting: Obstetrics & Gynecology

## 2011-08-08 HISTORY — DX: Essential (primary) hypertension: I10

## 2011-08-08 LAB — RAPID URINE DRUG SCREEN, HOSP PERFORMED
Amphetamines: NOT DETECTED
Opiates: NOT DETECTED

## 2011-08-08 LAB — CBC
Platelets: 299 10*3/uL (ref 150–400)
RBC: 3.71 MIL/uL — ABNORMAL LOW (ref 3.87–5.11)
WBC: 12.2 10*3/uL — ABNORMAL HIGH (ref 4.0–10.5)

## 2011-08-08 LAB — URINALYSIS, ROUTINE W REFLEX MICROSCOPIC
Glucose, UA: NEGATIVE mg/dL
Hgb urine dipstick: NEGATIVE
Ketones, ur: 15 mg/dL — AB
Protein, ur: 100 mg/dL — AB
Specific Gravity, Urine: >1.03 — ABNORMAL HIGH (ref 1.005–1.030)
pH: 6 (ref 5.0–8.0)

## 2011-08-08 LAB — URINE MICROSCOPIC-ADD ON

## 2011-08-08 MED ORDER — FLUTICASONE PROPIONATE 50 MCG/ACT NA SUSP
2.0000 | Freq: Every day | NASAL | Status: DC
  Administered 2011-08-09: 2 via NASAL
  Filled 2011-08-08 (×4): qty 320000

## 2011-08-08 MED ORDER — ONDANSETRON HCL 4 MG/2ML IJ SOLN: 4.0000 mg | Freq: Four times a day (QID) | INTRAMUSCULAR | Status: DC | PRN

## 2011-08-08 MED ORDER — LIDOCAINE HCL (PF) 1 % IJ SOLN
30.0000 mL | INTRAMUSCULAR | Status: DC | PRN
  Filled 2011-08-08: qty 30

## 2011-08-08 MED ORDER — IBUPROFEN 600 MG PO TABS: 600.0000 mg | ORAL_TABLET | Freq: Four times a day (QID) | ORAL | Status: DC | PRN

## 2011-08-08 MED ORDER — LACTATED RINGERS IV SOLN
INTRAVENOUS | Status: DC
  Administered 2011-08-08 – 2011-08-09 (×2): via INTRAVENOUS

## 2011-08-08 MED ORDER — LACTATED RINGERS IV SOLN: 500.0000 mL | INTRAVENOUS | Status: DC | PRN

## 2011-08-08 MED ORDER — FLEET ENEMA 7-19 GM/118ML RE ENEM: 1.0000 | ENEMA | RECTAL | Status: DC | PRN

## 2011-08-08 MED ORDER — NALBUPHINE SYRINGE 5 MG/0.5 ML: 10.0000 mg | INJECTION | INTRAMUSCULAR | Status: DC | PRN

## 2011-08-08 MED ORDER — OXYTOCIN 20 UNITS IN LACTATED RINGERS INFUSION - SIMPLE
125.0000 mL/h | Freq: Once | INTRAVENOUS | Status: DC
  Filled 2011-08-08: qty 1000

## 2011-08-08 MED ORDER — OXYCODONE-ACETAMINOPHEN 5-325 MG PO TABS: 2.0000 | ORAL_TABLET | ORAL | Status: DC | PRN

## 2011-08-08 MED ORDER — ATENOLOL 25 MG PO TABS
25.0000 mg | ORAL_TABLET | Freq: Every day | ORAL | Status: DC
  Administered 2011-08-09 – 2011-08-10 (×2): 25 mg via ORAL
  Filled 2011-08-08 (×4): qty 1

## 2011-08-08 MED ORDER — TERBUTALINE SULFATE 1 MG/ML IJ SOLN: 0.2500 mg | Freq: Once | INTRAMUSCULAR | Status: AC | PRN

## 2011-08-08 MED ORDER — OXYTOCIN BOLUS FROM INFUSION
500.0000 mL | Freq: Once | INTRAVENOUS | Status: DC
  Filled 2011-08-08: qty 500

## 2011-08-08 MED ORDER — CITRIC ACID-SODIUM CITRATE 334-500 MG/5ML PO SOLN: 30.0000 mL | ORAL | Status: DC | PRN

## 2011-08-08 MED ORDER — ACETAMINOPHEN 325 MG PO TABS: 650.0000 mg | ORAL_TABLET | ORAL | Status: DC | PRN

## 2011-08-08 MED ORDER — PRENATAL PLUS 27-1 MG PO TABS
1.0000 | ORAL_TABLET | Freq: Every day | ORAL | Status: DC
  Administered 2011-08-09 – 2011-08-10 (×2): 1 via ORAL
  Filled 2011-08-08 (×2): qty 1

## 2011-08-08 MED ORDER — OXYTOCIN 20 UNITS IN LACTATED RINGERS INFUSION - SIMPLE
1.0000 m[IU]/min | INTRAVENOUS | Status: DC
  Administered 2011-08-08: 2 m[IU]/min via INTRAVENOUS

## 2011-08-08 NOTE — ED Provider Notes (Signed)
History   In with limited PNC, no care since 33 wks. States care at [facility] co. C/O abd pain and questionable leaking of fluid.  Chief Complaint  Patient presents with  . Contractions   HPI  OB History    Grav Para Term Preterm Abortions TAB SAB Ect Mult Living   6 3 3  2  2   3       Past Medical History  Diagnosis Date  . Hypertension     History reviewed. No pertinent past surgical history.  No family history on file.  History  Substance Use Topics  . Smoking status: Former Smoker -- 0.2 packs/day    Types: Cigarettes    Quit date: 07/08/2011  . Smokeless tobacco: Not on file  . Alcohol Use: No    Allergies:  Allergies  Allergen Reactions  . Penicillins Cross Reactors Hives, Nausea And Vomiting and Swelling    Prescriptions prior to admission  Medication Sig Dispense Refill  . atenolol (TENORMIN) 25 MG tablet Take 25 mg by mouth daily.        . fluticasone (FLONASE) 50 MCG/ACT nasal spray Place 2 sprays into the nose daily.        Marland Kitchen HYDROcodone-acetaminophen (NORCO) 5-325 MG per tablet Take 1 tablet by mouth every 6 (six) hours as needed. For back pain       . prenatal vitamin w/FE, FA (PRENATAL 1 + 1) 27-1 MG TABS Take 1 tablet by mouth daily.          Review of Systems  Constitutional: Negative.   HENT: Positive for congestion.   Eyes: Negative.   Respiratory: Positive for cough.   Cardiovascular: Negative.   Gastrointestinal: Negative.   Genitourinary: Negative.   Musculoskeletal: Negative.   Skin: Negative.   Neurological: Negative.   Endo/Heme/Allergies: Negative.   Psychiatric/Behavioral: Negative.    Physical Exam   Blood pressure 134/87, pulse 123, temperature 98.1 F (36.7 C), resp. rate 22, last menstrual period 11/09/2010, SpO2 97.00%.  Physical Exam  Constitutional: She is oriented to person, place, and time. She appears well-developed and well-nourished.  Cardiovascular: Normal rate, regular rhythm and normal heart sounds.     Respiratory: Effort normal and breath sounds normal.  GI: Soft. Bowel sounds are normal.  Genitourinary: Vagina normal and uterus normal.  Musculoskeletal: Normal range of motion.  Neurological: She is alert and oriented to person, place, and time. She has normal reflexes.  Skin: Skin is warm and dry.  Psychiatric: She has a normal mood and affect. Her behavior is normal. Judgment and thought content normal.    MAU Course  Procedures  MDM    Assessment and Plan  Will observe and if cervical change noted will admit in labor  [name] 08/08/2011, 8:30 PM

## 2011-08-08 NOTE — Progress Notes (Signed)
CONNY SITU is a 25 y.o. [phone] at [redacted]w[redacted]d by ultrasound admitted for active labor  Subjective:   Objective: BP 139/84  Pulse 95  Temp(Src) 98.1 F (36.7 C) (Oral)  Resp 20  Ht 5' (1.524 m)  Wt 76.204 kg (168 lb)  BMI 32.81 kg/m2  SpO2 97%  LMP 11/09/2010      FHT:  FHR: 140 bpm, variability: moderate,  accelerations:  Present,  decelerations:  Absent UC:   regular, every 3-4 minutes SVE:   Dilation: 3.5 Effacement (%): 70 Station: -2 Exam by:: D.Jodean Valade,CNM  Labs: Lab Results  Component Value Date   WBC 12.2* 08/08/2011   HGB 11.0* 08/08/2011   HCT 32.8* 08/08/2011   MCV 88.4 08/08/2011   PLT 299 08/08/2011    Assessment / Plan: Augmentation of labor, progressing well  Labor: progressing on pit. Preeclampsia:  no signs or symptoms of toxicity Fetal Wellbeing:  Category I Pain Control:  Labor support without medications I/D:  n/a Anticipated MOD:  NSVD  [name] 08/08/2011, 11:37 PM

## 2011-08-08 NOTE — Progress Notes (Signed)
Inadequate uc's will start pit aug.

## 2011-08-08 NOTE — Progress Notes (Signed)
SVE no change in cervix, vtx presentation.

## 2011-08-08 NOTE — H&P (Signed)
[name] is a 25 y.o. female presenting for labor. Maternal Medical History:  Reason for admission: Reason for admission: contractions.  Contractions: Onset was 3-5 hours ago.   Frequency: regular.   Perceived severity is strong.    Fetal activity: Perceived fetal activity is normal.   Last perceived fetal movement was within the past hour.      OB History    Grav Para Term Preterm Abortions TAB SAB Ect Mult Living   6 3 3  2  2   3      Past Medical History  Diagnosis Date  . Hypertension    History reviewed. No pertinent past surgical history. Family History: family history is not on file. Social History:  reports that she quit smoking about 4 weeks ago. Her smoking use included Cigarettes. She smoked .25 packs per day. She does not have any smokeless tobacco history on file. She reports that she does not drink alcohol or use illicit drugs.  Review of Systems  Constitutional: Negative.   HENT: Positive for congestion.   Eyes: Negative.   Respiratory: Positive for cough.   Cardiovascular: Negative.   Gastrointestinal: Negative.   Genitourinary: Negative.   Musculoskeletal: Negative.   Skin: Negative.   Neurological: Negative.   Endo/Heme/Allergies: Negative.   Psychiatric/Behavioral: Negative.     Dilation: 2.5 Effacement (%): Thick Station: Ballotable Exam by:: D Sumner Boesch Blood pressure 134/87, pulse 123, temperature 98.1 F (36.7 C), resp. rate 22, last menstrual period 11/09/2010, SpO2 97.00%. Maternal Exam:  Uterine Assessment: Contraction strength is mild.  Contraction frequency is regular.   Abdomen: Patient reports no abdominal tenderness. Fetal presentation: vertex  Introitus: Normal vulva.   Physical Exam  Constitutional: She is oriented to person, place, and time. She appears well-developed and well-nourished.  HENT:  Head: Normocephalic.  Eyes: Pupils are equal, round, and reactive to light.  Neck: Normal range of motion.  Cardiovascular:  Normal rate, regular rhythm, normal heart sounds and intact distal pulses.   Respiratory: Effort normal and breath sounds normal.  GI: Soft. Bowel sounds are normal.  Genitourinary: Vagina normal and uterus normal.  Musculoskeletal: Normal range of motion.  Neurological: She is alert and oriented to person, place, and time. She has normal reflexes.  Skin: Skin is warm and dry.  Psychiatric: She has a normal mood and affect. Her behavior is normal. Judgment and thought content normal.    Prenatal labs: ABO, Rh:   Antibody:   Rubella:   RPR:    HBsAg:    HIV:    GBS:     Assessment/Plan: admit   [name] 08/08/2011, 8:43 PM

## 2011-08-08 NOTE — Progress Notes (Signed)
Pt wheeled directly to room 6. Wheelchair linens saturated with clear fluid. Pt reports LOF @ 18:30 and UC's q 5 min.

## 2011-08-08 NOTE — Progress Notes (Signed)
SVE 3-4/70/-1. Change noted will admit.

## 2011-08-09 ENCOUNTER — Inpatient Hospital Stay (HOSPITAL_COMMUNITY): Payer: Medicaid Other | Admitting: Anesthesiology

## 2011-08-09 ENCOUNTER — Encounter (HOSPITAL_COMMUNITY): Payer: Self-pay | Admitting: Anesthesiology

## 2011-08-09 ENCOUNTER — Encounter (HOSPITAL_COMMUNITY): Payer: Self-pay | Admitting: *Deleted

## 2011-08-09 LAB — RPR: RPR Ser Ql: NONREACTIVE

## 2011-08-09 MED ORDER — DIPHENHYDRAMINE HCL 50 MG/ML IJ SOLN
12.5000 mg | INTRAMUSCULAR | Status: DC | PRN
Start: 2011-08-09 — End: 2011-08-11

## 2011-08-09 MED ORDER — PHENYLEPHRINE 40 MCG/ML (10ML) SYRINGE FOR IV PUSH (FOR BLOOD PRESSURE SUPPORT)
80.0000 ug | PREFILLED_SYRINGE | INTRAVENOUS | Status: DC | PRN
  Filled 2011-08-09: qty 5

## 2011-08-09 MED ORDER — ONDANSETRON HCL 4 MG PO TABS: 4.0000 mg | ORAL_TABLET | ORAL | Status: DC | PRN

## 2011-08-09 MED ORDER — PHENYLEPHRINE 40 MCG/ML (10ML) SYRINGE FOR IV PUSH (FOR BLOOD PRESSURE SUPPORT)
80.0000 ug | PREFILLED_SYRINGE | INTRAVENOUS | Status: DC | PRN
  Filled 2011-08-09 (×2): qty 5

## 2011-08-09 MED ORDER — SENNOSIDES-DOCUSATE SODIUM 8.6-50 MG PO TABS
2.0000 | ORAL_TABLET | Freq: Every day | ORAL | Status: DC
  Administered 2011-08-09: 2 via ORAL

## 2011-08-09 MED ORDER — ONDANSETRON HCL 4 MG/2ML IJ SOLN: 4.0000 mg | INTRAMUSCULAR | Status: DC | PRN

## 2011-08-09 MED ORDER — WITCH HAZEL-GLYCERIN EX PADS: 1.0000 "application " | MEDICATED_PAD | CUTANEOUS | Status: DC | PRN

## 2011-08-09 MED ORDER — FENTANYL 2.5 MCG/ML BUPIVACAINE 1/10 % EPIDURAL INFUSION (WH - ANES)
14.0000 mL/h | INTRAMUSCULAR | Status: DC
  Administered 2011-08-09: 14 mL/h via EPIDURAL
  Filled 2011-08-09: qty 60

## 2011-08-09 MED ORDER — EPHEDRINE 5 MG/ML INJ
10.0000 mg | INTRAVENOUS | Status: DC | PRN
  Filled 2011-08-09: qty 4

## 2011-08-09 MED ORDER — DIBUCAINE 1 % RE OINT: 1.0000 "application " | TOPICAL_OINTMENT | RECTAL | Status: DC | PRN

## 2011-08-09 MED ORDER — FLUTICASONE PROPIONATE 50 MCG/ACT NA SUSP
2.0000 | Freq: Every day | NASAL | Status: DC
  Administered 2011-08-09 – 2011-08-10 (×2): 2 via NASAL
  Filled 2011-08-09 (×4): qty 320000

## 2011-08-09 MED ORDER — LIDOCAINE HCL 1.5 % IJ SOLN
INTRAMUSCULAR | Status: DC | PRN
  Administered 2011-08-09: 2 mL via EPIDURAL
  Administered 2011-08-09: 5 mL via EPIDURAL

## 2011-08-09 MED ORDER — ZOLPIDEM TARTRATE 5 MG PO TABS: 5.0000 mg | ORAL_TABLET | Freq: Every evening | ORAL | Status: DC | PRN

## 2011-08-09 MED ORDER — TETANUS-DIPHTH-ACELL PERTUSSIS 5-2.5-18.5 LF-MCG/0.5 IM SUSP
0.5000 mL | Freq: Once | INTRAMUSCULAR | Status: AC
  Administered 2011-08-10: 0.5 mL via INTRAMUSCULAR
  Filled 2011-08-09: qty 0.5

## 2011-08-09 MED ORDER — DIPHENHYDRAMINE HCL 25 MG PO CAPS: 25.0000 mg | ORAL_CAPSULE | Freq: Four times a day (QID) | ORAL | Status: DC | PRN

## 2011-08-09 MED ORDER — OXYCODONE-ACETAMINOPHEN 5-325 MG PO TABS
1.0000 | ORAL_TABLET | ORAL | Status: DC | PRN
  Administered 2011-08-09: 1 via ORAL
  Administered 2011-08-09 (×2): 2 via ORAL
  Administered 2011-08-09: 1 via ORAL
  Administered 2011-08-09: 2 via ORAL
  Administered 2011-08-10: 1 via ORAL
  Administered 2011-08-10 (×2): 2 via ORAL
  Administered 2011-08-10: 1 via ORAL
  Administered 2011-08-10 – 2011-08-11 (×6): 2 via ORAL
  Filled 2011-08-09 (×2): qty 2
  Filled 2011-08-09 (×2): qty 1
  Filled 2011-08-09: qty 2
  Filled 2011-08-09: qty 1
  Filled 2011-08-09 (×9): qty 2

## 2011-08-09 MED ORDER — SIMETHICONE 80 MG PO CHEW: 80.0000 mg | CHEWABLE_TABLET | ORAL | Status: DC | PRN

## 2011-08-09 MED ORDER — IBUPROFEN 600 MG PO TABS
600.0000 mg | ORAL_TABLET | Freq: Four times a day (QID) | ORAL | Status: DC
  Administered 2011-08-09 – 2011-08-11 (×10): 600 mg via ORAL
  Filled 2011-08-09 (×10): qty 1

## 2011-08-09 MED ORDER — LANOLIN HYDROUS EX OINT: TOPICAL_OINTMENT | CUTANEOUS | Status: DC | PRN

## 2011-08-09 MED ORDER — EPHEDRINE 5 MG/ML INJ
10.0000 mg | INTRAVENOUS | Status: DC | PRN
  Filled 2011-08-09 (×2): qty 4

## 2011-08-09 MED ORDER — BENZOCAINE-MENTHOL 20-0.5 % EX AERO: 1.0000 "application " | INHALATION_SPRAY | CUTANEOUS | Status: DC | PRN

## 2011-08-09 MED ORDER — LACTATED RINGERS IV SOLN: 500.0000 mL | Freq: Once | INTRAVENOUS | Status: DC

## 2011-08-09 NOTE — Anesthesia Preprocedure Evaluation (Addendum)
Anesthesia Evaluation  Name, MR# and DOB Patient awake  General Assessment Comment  Reviewed: Allergy & Precautions, H&P , NPO status , Patient's Chart, lab work & pertinent test results, reviewed documented beta blocker date and time   History of Anesthesia Complications Negative for: history of anesthetic complications  Airway Mallampati: I TM Distance: >3 FB Neck ROM: full  Mouth opening: Limited Mouth Opening  Dental  (+) Poor Dentition   Pulmonary  clear to auscultation  breath sounds clear to auscultation none    Cardiovascular hypertension, On Home Beta Blockers regular Normal    Neuro/Psych    (+) PSYCHIATRIC DISORDERS (anxiety - on valium (revealed after confronted with UDS results)),  opiate use for chronic leg pain   GI/Hepatic/Renal negative GI ROS  negative Liver ROS  negative Renal ROS        Endo/Other  Negative Endocrine ROS (+)      Abdominal   Musculoskeletal   Hematology negative hematology ROS (+)   Peds  Reproductive/Obstetrics (+) Pregnancy    Anesthesia Other Findings Former smoker - quit 1 mo ago UDS + for marijuana and benzodiazepines on admission - not upfront regarding substance use [facility]           Anesthesia Physical Anesthesia Plan  ASA: II  Anesthesia Plan: Epidural   Post-op Pain Management:    Induction:   Airway Management Planned:   Additional Equipment:   Intra-op Plan:   Post-operative Plan:   Informed Consent: I have reviewed the patients History and Physical, chart, labs and discussed the procedure including the risks, benefits and alternatives for the proposed anesthesia with the patient or authorized representative who has indicated his/her understanding and acceptance.     Plan Discussed with:   Anesthesia Plan Comments:         Anesthesia Quick Evaluation

## 2011-08-09 NOTE — Progress Notes (Signed)
UR chart review completed.  

## 2011-08-09 NOTE — Anesthesia Postprocedure Evaluation (Signed)
  Anesthesia Post-op Note  Patient: [name]  Procedure(s) Performed: * No procedures listed *  Patient Location: PACU and Mother/Baby  Anesthesia Type: Epidural  Level of Consciousness: awake, alert , oriented and patient cooperative  Airway and Oxygen Therapy: Patient Spontanous Breathing  Post-op Pain: none  Post-op Assessment: Post-op Vital signs reviewed, Patient's Cardiovascular Status Stable, Respiratory Function Stable, Patent Airway and No signs of Nausea or vomiting  Post-op Vital Signs: Reviewed and stable  Complications: No apparent anesthesia complications2
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
## 2011-08-09 NOTE — Anesthesia Procedure Notes (Signed)
Epidural Patient location during procedure: OB Start time: 08/09/2011 1:24 AM Reason for block: procedure for pain  Staffing Performed by: anesthesiologist   Preanesthetic Checklist Completed: patient identified, site marked, surgical consent, pre-op evaluation, timeout performed, IV checked, risks and benefits discussed and monitors and equipment checked  Epidural Patient position: sitting Prep: site prepped and draped and DuraPrep Patient monitoring: continuous pulse ox and blood pressure Approach: midline Injection technique: LOR air  Needle:  Needle type: Tuohy  Needle gauge: 17 G Needle length: 9 cm Needle insertion depth: 5 cm cm Catheter type: closed end flexible Catheter size: 19 Gauge Catheter at skin depth: 10 cm Test dose: negative  Assessment Events: blood not aspirated, injection not painful, no injection resistance, negative IV test and no paresthesia

## 2011-08-10 LAB — URINE CULTURE: Culture: NO GROWTH

## 2011-08-10 LAB — HERPES SIMPLEX VIRUS CULTURE: Culture: DETECTED

## 2011-08-10 LAB — URINALYSIS, ROUTINE W REFLEX MICROSCOPIC
Glucose, UA: NEGATIVE
Ketones, ur: NEGATIVE
Protein, ur: NEGATIVE
Urobilinogen, UA: 0.2

## 2011-08-10 LAB — CBC
MCHC: 35.6
MCV: 92.7
RBC: 3.05 — ABNORMAL LOW
RDW: 13.9

## 2011-08-10 MED ORDER — IBUPROFEN 600 MG PO TABS: 600.0000 mg | ORAL_TABLET | Freq: Four times a day (QID) | ORAL | Status: AC

## 2011-08-10 NOTE — Discharge Summary (Addendum)
Obstetric Discharge Summary Reason for Admission: onset of labor Prenatal Procedures: none Intrapartum Procedures: spontaneous vaginal delivery Postpartum Procedures: none Complications-Operative and Postpartum: none Hemoglobin  Date Value Range Status  08/08/2011 11.0* 12.0-15.0 (g/dL) Final     HCT  Date Value Range Status  08/08/2011 32.8* 36.0-46.0 (%) Final    Discharge Diagnoses: Term Pregnancy-delivered  Discharge Information: Date: 08/10/2011 Activity: pelvic rest Diet: routine Medications: Ibuprofen Condition: stable Instructions: refer to practice specific booklet Discharge to: home Follow-up Information    Follow up with [facility] in 6 weeks.   Contact information:   [address] [place] [address]          Newborn Data: Live born female  Birth Weight: 5 lb 5.4 oz (2420 g) APGAR: 9, 9  Home with mother.  [name] 08/10/2011, 7:59 AM  Discharge canceled as baby will not be discharged today. BOOTH, ERIN 08/10/2011, 10:40 AM

## 2011-08-10 NOTE — Progress Notes (Signed)
Post Partum Day 1  Subjective: no complaints, up ad lib, voiding, tolerating PO and + flatus  Objective: Blood pressure 131/87, pulse 80, temperature 97.9 F (36.6 C), temperature source Oral, resp. rate 20, height 5' (1.524 m), weight 76.204 kg (168 lb), last menstrual period 11/09/2010, SpO2 98.00%, unknown if currently breastfeeding.  Physical Exam:  General: alert, cooperative and no distress Lochia: appropriate Uterine Fundus: firm DVT Evaluation: No cords or calf tenderness. No significant calf/ankle edema.   Basename 08/08/11 2120  HGB 11.0*  HCT 32.8*    Assessment/Plan: Discharge home, Circumcision prior to discharge and Contraception nexplanon or IUD   LOS: 2 days   [name] 08/10/2011, 7:54 AM

## 2011-08-10 NOTE — Progress Notes (Signed)
PSYCHOSOCIAL ASSESSMENT ~ MATERNAL/CHILD Name: [name]                                                                                           Age: 27 day   Referral Date: 08/10/11   Reason/Source: Hx of drug use-MOB positive on admission for Benzos and MJ, other children with family?/CN   I. FAMILY/HOME ENVIRONMENT A. Child's Legal Guardian _x__Parent(s) ___Grandparent ___Foster parent ___DSS_________________ Name: [name]                                        DOB: 1986/01/02                     Age: 61  Address: [address] [place], [place] [address]  Name: [name]                                            DOB: //                     Age:   Address: same  B. Other Household Members/Support Persons Name: [name] (8)                   Relationship: sister              DOB ___/___/___                   Name: [name] (6)                       Relationship: brother            DOB ___/___/___                   Name: [name] (3)                         Relationship: sister              DOB ___/___/___                   Name:                                         Relationship:                        DOB ___/___/___  C. Other Support: Good support system   II. PSYCHOSOCIAL DATA A. Information Source  _x_Patient Interview  __Family Interview           _x_Other: chart  B. Event organiser _x_Employment: FOB works at [employer] __Medicaid    [place]:                 __Private Insurance:                   __Self Pay  __Food Stamps   _x_WIC __[employer]     __Public Housing     __Section 8    _x_Maternity Care Coordination/Child Service Coordination/Early Intervention-Nancy Craige Cotta [phone] __School:                                                                         Grade:  __Other:   Priscille Kluver and Environment Information Cultural Issues Impacting Care: MGM  died in 29-Jan-2011 III. STRENGTHS _x__Supportive family/friends _x__Adequate Resources _x__Compliance with medical plan _x__Home prepared for Child (including basic supplies) _x__Understanding of illness      ___Other: IV. RISK FACTORS AND CURRENT PROBLEMS         ____No Problems Noted                                                                                                                                                                                                                                                Pt              Family          Substance Abuse-Hx of MJ                                                    _x__              ___             Mental Illness-Anxiety and Depression  _x__              ___  Family/Relationship Issues                                      ___               ___             Abuse/Neglect/Domestic Violence                                         ___         ___  Financial Resources                                        ___              ___             Transportation                                                                        ___               ___  DSS Involvement                                                                   ___              ___  Adjustment to Illness                                                               ___              ___  Knowledge/Cognitive Deficit                                                   ___              ___             Compliance with Treatment                                                 ___  ___  Basic Needs (food, housing, etc.)                                          ___              ___             Housing Concerns                                       ___              ___ Other: Recent Grief/Loss of MGM 31-Jan-2023            V. SOCIAL WORK ASSESSMENT SW met with MOB to complete assessment.  MOB was extremely friendly and talkative.  She immediately  informed SW that her mother died in 01-31-23 of this year and that has clearly been on her mind constantly.  She states baby has brought her new hope and new purpose.  Bonding is evident.  She states she and her husband have 3 other children at home, who are currently being cared for by an aunt and grandmother while MOB is in the hospital and FOB is at work, but otherwise live with them in their home.  She states a good support system.  SW asked her about her positive drug screen for benzos and MJ on admission.  She states that she was prescribed Valium after her mother died and was instructed to take one at bedtime.  She states she did not take them this often, rather only as needed.  SW is understanding of this given her situation.  She states she only smoked MJ on occasion and does not see it as a problem or something that she plans to continue.  SW explained hospital drug screen policy and she was understanding.  Baby's UDS is negative.  SW asked if there were any other medications or drugs used during pregnancy.  She states she had morphine shots a couple weeks ago at [facility].  SW asked for an explanation of why, but is unclear.  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  MOB states no questions or needs.  SW received call from [facility] Department stating that she was unsure if MOB had a place to live and whether or not her children lived with her.  SW called [employer] CPS to see if she has an open case or if she has lost custody of her children.  They have no record of her.  SW called MOB back to confirm her address and ask if SW could make sure Ms. Craige Cotta has the correct address.  She confirmed it and agreed.  SW called Ms. Craige Cotta back and told her the address.  She  is a case Production designer, theatre/television/film who will be following up in the home post partum.    VI. SOCIAL WORK PLAN  ___No Further Intervention Required/No Barriers to Discharge   ___Psychosocial Support and Ongoing Assessment of Needs   ___Patient/Family Education:   ___Child Protective Services Report  County___________ Date___/____/____   ___Information/Referral to Community Resources_________________________   _x__Other: SW will monitor Meconium drug screen

## 2011-08-11 MED ORDER — AMLODIPINE BESYLATE 10 MG PO TABS: 10.0000 mg | ORAL_TABLET | Freq: Every day | ORAL | Status: DC

## 2011-08-11 MED ORDER — AMLODIPINE BESYLATE 10 MG PO TABS
10.0000 mg | ORAL_TABLET | Freq: Every day | ORAL | Status: DC
  Administered 2011-08-11: 10 mg via ORAL
  Filled 2011-08-11 (×2): qty 1

## 2011-08-11 NOTE — Progress Notes (Signed)
Post Partum Day #2 Subjective: Feels well; ready to go home. Starting to breastfeed; plans to get Depo at [facility] visit and possibly BTL after that. SW note read: concerns re pt having a home to go to.  Objective: Blood pressure 130/76, pulse 74, temperature 98.1 F (36.7 C), temperature source Oral, resp. rate 18, height 5' (1.524 m), weight 76.204 kg (168 lb), last menstrual period 11/09/2010, SpO2 98.00%, unknown if currently breastfeeding.  Physical Exam:  General: alert and cooperative Lochia: appropriate Uterine Fundus: firm DVT Evaluation: No evidence of DVT seen on physical exam.   Basename 08/08/11 2120  HGB 11.0*  HCT 32.8*    Assessment/Plan: Discharge home  Rx  Motrin and Norvasc 10mg  Has f/u appt sched 10/24 or prn SW to see prior to d/c     LOS: 3 days   SHAW, Cala Bradford 08/11/2011, 7:59 AM

## 2011-08-11 NOTE — Discharge Summary (Signed)
Obstetric Discharge Summary Reason for Admission: onset of labor Prenatal Procedures: none Intrapartum Procedures: spontaneous vaginal delivery Postpartum Procedures: none Complications-Operative and Postpartum: none Hemoglobin  Date Value Range Status  08/08/2011 11.0* 12.0-15.0 (g/dL) Final     HCT  Date Value Range Status  08/08/2011 32.8* 36.0-46.0 (%) Final    Discharge Diagnoses: Term Pregnancy-delivered  Discharge Information: Date: 08/11/2011 Activity: pelvic rest Diet: routine Medications: Ibuprofen and Norvasc Condition: stable Instructions: refer to practice specific booklet Discharge to: home Follow-up Information    Follow up with [facility] in 6 weeks.   Contact information:   [address] [place] [address]          Newborn Data: Live born female  Birth Weight: 5 lb 5.4 oz (2420 g) APGAR: 9, 9  Home with mother.  [name] 08/11/2011, 8:10 AM

## 2011-08-12 NOTE — Discharge Summary (Signed)
Attestation of Attending Supervision of Resident: Evaluation and management procedures were performed by the [facility] Resident under my supervision.  I have reviewed the resident's note, chart reviewed and agree with management and plan.  Michiah Mudry A 08/12/2011 11:29 AM

## 2011-08-24 LAB — HCG, QUANTITATIVE, PREGNANCY: hCG, Beta Chain, Quant, S: 872 — ABNORMAL HIGH

## 2011-08-24 LAB — URINALYSIS, ROUTINE W REFLEX MICROSCOPIC
Hgb urine dipstick: NEGATIVE
Nitrite: NEGATIVE
Specific Gravity, Urine: 1.01
Urobilinogen, UA: 0.2
pH: 7.5

## 2011-08-24 LAB — CBC
Hemoglobin: 11.8 — ABNORMAL LOW
MCHC: 34.8
MCV: 91.3
RBC: 3.71 — ABNORMAL LOW
RDW: 14.9 — ABNORMAL HIGH

## 2011-08-24 LAB — WET PREP, GENITAL
Trich, Wet Prep: NONE SEEN
Yeast Wet Prep HPF POC: NONE SEEN

## 2011-09-08 ENCOUNTER — Ambulatory Visit: Payer: Medicaid Other | Admitting: Obstetrics and Gynecology

## 2012-01-20 ENCOUNTER — Emergency Department (HOSPITAL_COMMUNITY)
Admission: EM | Admit: 2012-01-20 | Discharge: 2012-01-20 | Disposition: A | Discharge status: Home / Self Care | Payer: Self-pay | Attending: Emergency Medicine | Admitting: Emergency Medicine

## 2012-01-20 ENCOUNTER — Encounter (HOSPITAL_COMMUNITY): Payer: Self-pay | Admitting: *Deleted

## 2012-01-20 ENCOUNTER — Emergency Department (HOSPITAL_COMMUNITY): Payer: Self-pay

## 2012-01-20 DIAGNOSIS — R000 Tachycardia, unspecified: Secondary | ICD-10-CM | POA: Insufficient documentation

## 2012-01-20 DIAGNOSIS — M795 Residual foreign body in soft tissue: Secondary | ICD-10-CM | POA: Insufficient documentation

## 2012-01-20 DIAGNOSIS — M79609 Pain in unspecified limb: Secondary | ICD-10-CM | POA: Insufficient documentation

## 2012-01-20 HISTORY — DX: Cardiac murmur, unspecified: R01.1

## 2012-01-20 MED ORDER — SULFAMETHOXAZOLE-TRIMETHOPRIM 800-160 MG PO TABS: 1.0000 | ORAL_TABLET | Freq: Two times a day (BID) | ORAL | Status: DC

## 2012-01-20 MED ORDER — HYDROCODONE-ACETAMINOPHEN 5-325 MG PO TABS: 2.0000 | ORAL_TABLET | ORAL | Status: DC | PRN
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
## 2012-01-20 NOTE — Op Note (Signed)
*   No surgery found *  6:13 PM  PATIENT:  [name]  26 y.o. female  PRE-OPERATIVE DIAGNOSIS:  Open wound, ?fb in L forearm  POST-OPERATIVE DIAGNOSIS:  same  PROCEDURE:  Exploration of wound L antecubital area, venotomy, repair of vein, closure of wound 2cm SURGEON:  Marsi Turvey  ANESTHESIA:   local  SPECIMEN:  No Specimen  FINDINGS:  No fb visualized, intra -op ultrasound used, no fb seen    PATIENT DISPOSITION:  d/c home

## 2012-01-20 NOTE — Discharge Instructions (Signed)
Return sooner to [facility] or the emergency department if you develop fever, new redness to the left arm, pneumocystis or numbness in left arm, chest pain, shortness of breath, or other concerns.

## 2012-01-21 ENCOUNTER — Encounter (HOSPITAL_COMMUNITY): Payer: Self-pay

## 2012-01-21 ENCOUNTER — Emergency Department (HOSPITAL_COMMUNITY): Payer: Self-pay

## 2012-01-21 ENCOUNTER — Emergency Department (HOSPITAL_COMMUNITY)
Admission: EM | Admit: 2012-01-21 | Discharge: 2012-01-21 | Disposition: A | Discharge status: Home / Self Care | Payer: Self-pay | Attending: Emergency Medicine | Admitting: Emergency Medicine

## 2012-01-21 DIAGNOSIS — M25529 Pain in unspecified elbow: Secondary | ICD-10-CM | POA: Insufficient documentation

## 2012-01-21 DIAGNOSIS — IMO0002 Reserved for concepts with insufficient information to code with codable children: Secondary | ICD-10-CM | POA: Insufficient documentation

## 2012-01-21 DIAGNOSIS — M795 Residual foreign body in soft tissue: Secondary | ICD-10-CM

## 2012-01-21 DIAGNOSIS — G8918 Other acute postprocedural pain: Secondary | ICD-10-CM

## 2012-01-21 DIAGNOSIS — X789XXA Intentional self-harm by unspecified sharp object, initial encounter: Secondary | ICD-10-CM | POA: Insufficient documentation

## 2012-01-21 DIAGNOSIS — F17200 Nicotine dependence, unspecified, uncomplicated: Secondary | ICD-10-CM | POA: Insufficient documentation

## 2012-01-21 DIAGNOSIS — I10 Essential (primary) hypertension: Secondary | ICD-10-CM | POA: Insufficient documentation

## 2012-01-21 LAB — CBC
HCT: 37.6 % (ref 36.0–46.0)
Hemoglobin: 12.3 g/dL (ref 12.0–15.0)
MCH: 27.3 pg (ref 26.0–34.0)
MCHC: 32.7 g/dL (ref 30.0–36.0)
MCV: 83.4 fL (ref 78.0–100.0)

## 2012-01-21 LAB — DIFFERENTIAL
Eosinophils Absolute: 0.1 10*3/uL (ref 0.0–0.7)
Eosinophils Relative: 2 % (ref 0–5)
Lymphocytes Relative: 23 % (ref 12–46)
Lymphs Abs: 1.3 10*3/uL (ref 0.7–4.0)
Monocytes Absolute: 0.4 10*3/uL (ref 0.1–1.0)
Monocytes Relative: 7 % (ref 3–12)

## 2012-01-21 MED ORDER — ONDANSETRON HCL 4 MG/2ML IJ SOLN
4.0000 mg | Freq: Once | INTRAMUSCULAR | Status: AC
  Administered 2012-01-21: 4 mg via INTRAVENOUS
  Filled 2012-01-21: qty 2

## 2012-01-21 MED ORDER — OXYCODONE-ACETAMINOPHEN 5-325 MG PO TABS
2.0000 | ORAL_TABLET | Freq: Once | ORAL | Status: AC
  Administered 2012-01-21: 2 via ORAL
  Filled 2012-01-21: qty 2

## 2012-01-21 MED ORDER — HYDROMORPHONE HCL PF 1 MG/ML IJ SOLN
1.0000 mg | Freq: Once | INTRAMUSCULAR | Status: AC
  Administered 2012-01-21: 1 mg via INTRAVENOUS
  Filled 2012-01-21: qty 1

## 2012-01-21 MED ORDER — SODIUM CHLORIDE 0.9 % IV BOLUS (SEPSIS)
1000.0000 mL | Freq: Once | INTRAVENOUS | Status: AC
  Administered 2012-01-21: 1000 mL via INTRAVENOUS

## 2012-01-21 MED ORDER — OXYCODONE-ACETAMINOPHEN 7.5-325 MG PO TABS: 1.0000 | ORAL_TABLET | ORAL | Status: DC | PRN

## 2012-01-21 NOTE — ED Notes (Signed)
Patient transported to X-ray 

## 2012-01-21 NOTE — Discharge Instructions (Signed)
  No signs of infection on x-ray or your blood could be found.  I am going to change her pain medication to something stronger along with some nausea medicine.  Please return if you develop a fever, chills or worsening of your pain.     Foreign Body When the skin is cut or punctured and some object is left in the tissue under the skin, that object is called a "foreign body". A foreign body could be a wood splinter, a thorn, a sliver of metal, a shard of glass, a cactus needle or the tip of a pencil. In most instances, your caregiver will recommend that the foreign body be removed. If it is not removed, infection, abscess formation, an allergic reaction, chronic pain and disability can occur over time. Sometimes, foreign bodies (particularly very small ones) can be difficult to locate. Your caregiver may recommend x-rays or ultrasound imaging to help find them. If removal is not successful, there may be a need to see a surgeon who might suggest further exploration in the operating room. Occasionally, tiny bits of metallic foreign material (such as shrapnel) are not removed, if it is felt that there would be no harm in leaving them untouched. HOME CARE INSTRUCTIONS  Rest the injured area and keep it elevated until all the pain and swelling are gone.   You will need a tetanus vaccination if you have not had one in the last 5 years.   Return to this facility, see your caregiver or follow-up as instructed in 2 days.  SEEK IMMEDIATE MEDICAL CARE IF:   You develop increasing redness or swelling of the skin near the wound.   You develop drainage of pus from the wound.   You have persistent pain or loss of motion.   You have red streaks extending above or below the wound location.  MAKE SURE YOU:   Understand these instructions.   Will watch your condition.   Will get help right away if you are not doing well or get worse.  Document Released: 11/01/2005 Document Revised: 10/21/2011 Document  Reviewed: 10/03/2009 [facility] Patient Information 2012 [place], [place].

## 2012-01-21 NOTE — ED Notes (Signed)
Pt given discharge instructions and left arm sling and rx, verb understanding, amb indep to discharge window.

## 2012-01-21 NOTE — ED Provider Notes (Signed)
Medical screening examination/treatment/procedure(s) were performed by non-physician practitioner and as supervising physician I was immediately available for consultation/collaboration.    Nelia Shi, MD 01/21/12 [phone]
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
## 2012-01-26 ENCOUNTER — Emergency Department (HOSPITAL_COMMUNITY)
Admission: EM | Admit: 2012-01-26 | Discharge: 2012-01-26 | Disposition: A | Discharge status: Home / Self Care | Payer: Self-pay | Attending: Emergency Medicine | Admitting: Emergency Medicine

## 2012-01-26 ENCOUNTER — Encounter (HOSPITAL_COMMUNITY): Payer: Self-pay | Admitting: *Deleted

## 2012-01-26 ENCOUNTER — Emergency Department (HOSPITAL_COMMUNITY): Payer: Self-pay

## 2012-01-26 DIAGNOSIS — S301XXA Contusion of abdominal wall, initial encounter: Secondary | ICD-10-CM | POA: Insufficient documentation

## 2012-01-26 DIAGNOSIS — R1012 Left upper quadrant pain: Secondary | ICD-10-CM | POA: Insufficient documentation

## 2012-01-26 DIAGNOSIS — M549 Dorsalgia, unspecified: Secondary | ICD-10-CM | POA: Insufficient documentation

## 2012-01-26 DIAGNOSIS — I10 Essential (primary) hypertension: Secondary | ICD-10-CM | POA: Insufficient documentation

## 2012-01-26 LAB — POCT I-STAT, CHEM 8
BUN: 13 mg/dL (ref 6–23)
Calcium, Ion: 1.18 mmol/L (ref 1.12–1.32)
Creatinine, Ser: 0.8 mg/dL (ref 0.50–1.10)
Glucose, Bld: 97 mg/dL (ref 70–99)
Hemoglobin: 15 g/dL (ref 12.0–15.0)
TCO2: 23 mmol/L (ref 0–100)

## 2012-01-26 MED ORDER — IOHEXOL 300 MG/ML  SOLN
80.0000 mL | Freq: Once | INTRAMUSCULAR | Status: AC | PRN
  Administered 2012-01-26: 80 mL via INTRAVENOUS

## 2012-01-26 MED ORDER — MORPHINE SULFATE 4 MG/ML IJ SOLN
4.0000 mg | Freq: Once | INTRAMUSCULAR | Status: AC
  Administered 2012-01-26: 4 mg via INTRAVENOUS
  Filled 2012-01-26: qty 1

## 2012-01-26 MED ORDER — HYDROCODONE-ACETAMINOPHEN 5-325 MG PO TABS
1.0000 | ORAL_TABLET | Freq: Once | ORAL | Status: AC
  Administered 2012-01-26: 1 via ORAL
  Filled 2012-01-26: qty 1

## 2012-01-26 MED ORDER — IBUPROFEN 800 MG PO TABS: 800.0000 mg | ORAL_TABLET | Freq: Three times a day (TID) | ORAL | Status: AC

## 2012-01-26 MED ORDER — HYDROCODONE-ACETAMINOPHEN 5-325 MG PO TABS: 1.0000 | ORAL_TABLET | ORAL | Status: DC | PRN

## 2012-01-26 NOTE — ED Notes (Signed)
Patient transported to CT 

## 2012-01-26 NOTE — Discharge Instructions (Signed)
Take vicodin as prescribed for severe pain.   Do not drive within four hours of taking this medication (may cause drowsiness or confusion).  Take ibuprofen w/ food up to three times a day, as well.  Apply an ice pack to the painful area. Follow up with your family doctor if pain has not started to improve in 5-7 days.  Call [facility] [phone]) if you do not have a primary care doctor and would like assistance with finding one.    You may return to the ER if symptoms worsen or you have any other concerns.
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
## 2012-01-28 NOTE — ED Provider Notes (Signed)
Medical screening examination/treatment/procedure(s) were performed by non-physician practitioner and as supervising physician I was immediately available for consultation/collaboration.    Shelda Jakes, MD 01/28/12 2127

## 2012-01-31 ENCOUNTER — Emergency Department (HOSPITAL_COMMUNITY): Payer: Self-pay

## 2012-01-31 ENCOUNTER — Emergency Department (HOSPITAL_COMMUNITY)
Admission: EM | Admit: 2012-01-31 | Discharge: 2012-01-31 | Disposition: A | Discharge status: Home / Self Care | Payer: Self-pay | Attending: Emergency Medicine | Admitting: Emergency Medicine

## 2012-01-31 ENCOUNTER — Encounter (HOSPITAL_COMMUNITY): Payer: Self-pay

## 2012-01-31 DIAGNOSIS — S301XXA Contusion of abdominal wall, initial encounter: Secondary | ICD-10-CM | POA: Insufficient documentation

## 2012-01-31 DIAGNOSIS — R109 Unspecified abdominal pain: Secondary | ICD-10-CM | POA: Insufficient documentation

## 2012-01-31 DIAGNOSIS — R10812 Left upper quadrant abdominal tenderness: Secondary | ICD-10-CM | POA: Insufficient documentation

## 2012-01-31 LAB — CBC
HCT: 37.1 % (ref 36.0–46.0)
MCH: 28.1 pg (ref 26.0–34.0)
MCV: 83.4 fL (ref 78.0–100.0)
Platelets: 365 10*3/uL (ref 150–400)
RDW: 17.7 % — ABNORMAL HIGH (ref 11.5–15.5)

## 2012-01-31 LAB — URINALYSIS, ROUTINE W REFLEX MICROSCOPIC
Bilirubin Urine: NEGATIVE
Glucose, UA: NEGATIVE mg/dL
Hgb urine dipstick: NEGATIVE
Protein, ur: NEGATIVE mg/dL
Urobilinogen, UA: 0.2 mg/dL (ref 0.0–1.0)

## 2012-01-31 LAB — POCT I-STAT, CHEM 8
BUN: 10 mg/dL (ref 6–23)
Creatinine, Ser: 0.6 mg/dL (ref 0.50–1.10)
Glucose, Bld: 88 mg/dL (ref 70–99)
Hemoglobin: 13.6 g/dL (ref 12.0–15.0)
Potassium: 4.2 mEq/L (ref 3.5–5.1)
Sodium: 141 mEq/L (ref 135–145)

## 2012-01-31 MED ORDER — OXYCODONE-ACETAMINOPHEN 5-325 MG PO TABS: 1.0000 | ORAL_TABLET | ORAL | Status: AC | PRN

## 2012-01-31 MED ORDER — HYDROMORPHONE HCL PF 1 MG/ML IJ SOLN
1.0000 mg | Freq: Once | INTRAMUSCULAR | Status: AC
  Administered 2012-01-31: 1 mg via INTRAVENOUS
  Filled 2012-01-31: qty 1

## 2012-01-31 MED ORDER — IOHEXOL 300 MG/ML  SOLN
80.0000 mL | Freq: Once | INTRAMUSCULAR | Status: AC | PRN
  Administered 2012-01-31: 80 mL via INTRAVENOUS

## 2012-01-31 MED ORDER — ONDANSETRON HCL 4 MG/2ML IJ SOLN
4.0000 mg | Freq: Once | INTRAMUSCULAR | Status: AC
  Administered 2012-01-31: 4 mg via INTRAVENOUS
  Filled 2012-01-31: qty 2
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
## 2012-01-31 NOTE — ED Notes (Signed)
Patient resting comfortably on stretcher

## 2012-01-31 NOTE — ED Notes (Signed)
Patient states pain increasing LLQ and LUQ 7-8/10 achy dull. Resting comfortably on stretcher,

## 2012-01-31 NOTE — ED Notes (Signed)
Patient states pain improved 3/10 LLQ/LUQ resting comfortably

## 2012-01-31 NOTE — Discharge Instructions (Signed)
Your CT scan, blood work, urine analysis are all normal. There is no signs of intra abdominal organ damage. Take pain medications as prescribed. Avoid strenuous physical activity. Follow up with your doctor in the office.   Blunt Abdominal Trauma A blunt injury to the abdomen can cause pain. The pain is most likely from bruising and stretching of your muscles. This pain is often made worse with movement. Most often these injuries are not serious and get better within 1 week with rest and mild pain medicine. However, internal organs (liver, spleen, kidneys) can be injured with blunt trauma. If you do not get better or if you get worse, further examination may be needed. Continue with your regular daily activities, but avoid any strenuous activities until your pain is improved. If your stomach is upset, stick to a clear liquid diet and slowly advance to solid food.  SEEK IMMEDIATE MEDICAL CARE IF:   You develop increasing pain, nausea, or repeated vomiting.   You develop chest pain or breathing difficulty.   You develop blood in the urine, vomit, or stool.   You develop weakness, fainting, fever, or other serious complaints.  Document Released: 12/09/2004 Document Revised: 10/21/2011 Document Reviewed: 03/27/2009 [facility] Patient Information 2012 [place], [place].

## 2012-02-01 NOTE — ED Provider Notes (Signed)
Medical screening examination/treatment/procedure(s) were performed by non-physician practitioner and as supervising physician I was immediately available for consultation/collaboration.  Juliet Rude. Rubin Payor, MD 02/01/12 [phone]

## 2013-07-22 ENCOUNTER — Encounter (HOSPITAL_BASED_OUTPATIENT_CLINIC_OR_DEPARTMENT_OTHER): Payer: Self-pay

## 2013-07-22 ENCOUNTER — Emergency Department (HOSPITAL_BASED_OUTPATIENT_CLINIC_OR_DEPARTMENT_OTHER)
Admission: EM | Admit: 2013-07-22 | Discharge: 2013-07-22 | Discharge status: Against Medical Advice | Payer: Medicaid Other | Attending: Emergency Medicine | Admitting: Emergency Medicine

## 2013-07-22 DIAGNOSIS — Y92410 Unspecified street and highway as the place of occurrence of the external cause: Secondary | ICD-10-CM | POA: Insufficient documentation

## 2013-07-22 DIAGNOSIS — S3981XA Other specified injuries of abdomen, initial encounter: Secondary | ICD-10-CM | POA: Insufficient documentation

## 2013-07-22 DIAGNOSIS — Y939 Activity, unspecified: Secondary | ICD-10-CM | POA: Insufficient documentation

## 2013-07-22 DIAGNOSIS — S298XXA Other specified injuries of thorax, initial encounter: Secondary | ICD-10-CM | POA: Insufficient documentation

## 2013-07-22 DIAGNOSIS — I10 Essential (primary) hypertension: Secondary | ICD-10-CM | POA: Insufficient documentation

## 2013-07-22 DIAGNOSIS — R011 Cardiac murmur, unspecified: Secondary | ICD-10-CM | POA: Insufficient documentation

## 2013-07-22 DIAGNOSIS — R112 Nausea with vomiting, unspecified: Secondary | ICD-10-CM | POA: Insufficient documentation
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
## 2013-07-23 ENCOUNTER — Emergency Department (HOSPITAL_BASED_OUTPATIENT_CLINIC_OR_DEPARTMENT_OTHER): Payer: Medicaid Other

## 2013-07-23 ENCOUNTER — Emergency Department (HOSPITAL_BASED_OUTPATIENT_CLINIC_OR_DEPARTMENT_OTHER)
Admission: EM | Admit: 2013-07-23 | Discharge: 2013-07-23 | Disposition: A | Discharge status: Home / Self Care | Payer: Medicaid Other | Attending: Emergency Medicine | Admitting: Emergency Medicine

## 2013-07-23 ENCOUNTER — Encounter (HOSPITAL_BASED_OUTPATIENT_CLINIC_OR_DEPARTMENT_OTHER): Payer: Self-pay | Admitting: *Deleted

## 2013-07-23 DIAGNOSIS — Z3202 Encounter for pregnancy test, result negative: Secondary | ICD-10-CM | POA: Insufficient documentation

## 2013-07-23 DIAGNOSIS — S298XXA Other specified injuries of thorax, initial encounter: Secondary | ICD-10-CM | POA: Insufficient documentation

## 2013-07-23 DIAGNOSIS — R519 Headache, unspecified: Secondary | ICD-10-CM

## 2013-07-23 DIAGNOSIS — S0990XA Unspecified injury of head, initial encounter: Secondary | ICD-10-CM | POA: Insufficient documentation

## 2013-07-23 DIAGNOSIS — Z880 Allergy status to penicillin: Secondary | ICD-10-CM | POA: Insufficient documentation

## 2013-07-23 DIAGNOSIS — Y9389 Activity, other specified: Secondary | ICD-10-CM | POA: Insufficient documentation

## 2013-07-23 DIAGNOSIS — S3981XA Other specified injuries of abdomen, initial encounter: Secondary | ICD-10-CM | POA: Insufficient documentation

## 2013-07-23 DIAGNOSIS — Y92410 Unspecified street and highway as the place of occurrence of the external cause: Secondary | ICD-10-CM | POA: Insufficient documentation

## 2013-07-23 DIAGNOSIS — R011 Cardiac murmur, unspecified: Secondary | ICD-10-CM | POA: Insufficient documentation

## 2013-07-23 DIAGNOSIS — I10 Essential (primary) hypertension: Secondary | ICD-10-CM | POA: Insufficient documentation

## 2013-07-23 DIAGNOSIS — Z8669 Personal history of other diseases of the nervous system and sense organs: Secondary | ICD-10-CM | POA: Insufficient documentation

## 2013-07-23 DIAGNOSIS — F17200 Nicotine dependence, unspecified, uncomplicated: Secondary | ICD-10-CM | POA: Insufficient documentation

## 2013-07-23 DIAGNOSIS — S299XXA Unspecified injury of thorax, initial encounter: Secondary | ICD-10-CM

## 2013-07-23 HISTORY — DX: Unspecified convulsions: R56.9

## 2013-07-23 LAB — CBC WITH DIFFERENTIAL/PLATELET
Basophils Relative: 1 % (ref 0–1)
Eosinophils Absolute: 0.1 10*3/uL (ref 0.0–0.7)
Eosinophils Relative: 2 % (ref 0–5)
Lymphs Abs: 1.3 10*3/uL (ref 0.7–4.0)
MCH: 29.8 pg (ref 26.0–34.0)
MCHC: 34.3 g/dL (ref 30.0–36.0)
MCV: 87 fL (ref 78.0–100.0)
Neutrophils Relative %: 61 % (ref 43–77)
Platelets: 280 10*3/uL (ref 150–400)
RBC: 4.86 MIL/uL (ref 3.87–5.11)

## 2013-07-23 LAB — COMPREHENSIVE METABOLIC PANEL
ALT: 108 U/L — ABNORMAL HIGH (ref 0–35)
Albumin: 3.6 g/dL (ref 3.5–5.2)
BUN: 10 mg/dL (ref 6–23)
Calcium: 9.8 mg/dL (ref 8.4–10.5)
GFR calc Af Amer: >90 mL/min (ref >90)
Glucose, Bld: 106 mg/dL — ABNORMAL HIGH (ref 70–99)
Potassium: 4.3 mEq/L (ref 3.5–5.1)
Sodium: 139 mEq/L (ref 135–145)
Total Protein: 7.5 g/dL (ref 6.0–8.3)

## 2013-07-23 LAB — URINALYSIS, ROUTINE W REFLEX MICROSCOPIC
Ketones, ur: NEGATIVE mg/dL
Nitrite: NEGATIVE
Protein, ur: NEGATIVE mg/dL

## 2013-07-23 LAB — APTT: aPTT: 33 seconds (ref 24–37)

## 2013-07-23 LAB — PROTIME-INR: Prothrombin Time: 13.1 seconds (ref 11.6–15.2)

## 2013-07-23 LAB — PREGNANCY, URINE: Preg Test, Ur: NEGATIVE

## 2013-07-23 MED ORDER — SODIUM CHLORIDE 0.9 % IV BOLUS (SEPSIS)
1000.0000 mL | Freq: Once | INTRAVENOUS | Status: AC
  Administered 2013-07-23: 1000 mL via INTRAVENOUS

## 2013-07-23 MED ORDER — FENTANYL CITRATE 0.05 MG/ML IJ SOLN
50.0000 ug | Freq: Once | INTRAMUSCULAR | Status: AC
  Administered 2013-07-23: 50 ug via INTRAVENOUS
  Filled 2013-07-23: qty 2

## 2013-07-23 MED ORDER — CYCLOBENZAPRINE HCL 10 MG PO TABS: 10.0000 mg | ORAL_TABLET | Freq: Three times a day (TID) | ORAL | Status: DC | PRN

## 2013-07-23 MED ORDER — IBUPROFEN 800 MG PO TABS: 800.0000 mg | ORAL_TABLET | Freq: Three times a day (TID) | ORAL | Status: DC

## 2013-07-23 MED ORDER — IOHEXOL 300 MG/ML  SOLN
100.0000 mL | Freq: Once | INTRAMUSCULAR | Status: AC | PRN
  Administered 2013-07-23: 100 mL via INTRAVENOUS
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
## 2013-07-24 LAB — URINE CULTURE: Colony Count: 40000

## 2013-10-21 ENCOUNTER — Encounter (HOSPITAL_BASED_OUTPATIENT_CLINIC_OR_DEPARTMENT_OTHER): Payer: Self-pay | Admitting: Emergency Medicine

## 2013-10-21 ENCOUNTER — Emergency Department (HOSPITAL_BASED_OUTPATIENT_CLINIC_OR_DEPARTMENT_OTHER): Payer: Self-pay

## 2013-10-21 ENCOUNTER — Emergency Department (HOSPITAL_BASED_OUTPATIENT_CLINIC_OR_DEPARTMENT_OTHER)
Admission: EM | Admit: 2013-10-21 | Discharge: 2013-10-21 | Disposition: A | Discharge status: Home / Self Care | Payer: Self-pay | Attending: Emergency Medicine | Admitting: Emergency Medicine

## 2013-10-21 DIAGNOSIS — R011 Cardiac murmur, unspecified: Secondary | ICD-10-CM | POA: Insufficient documentation

## 2013-10-21 DIAGNOSIS — Z880 Allergy status to penicillin: Secondary | ICD-10-CM | POA: Insufficient documentation

## 2013-10-21 DIAGNOSIS — F17200 Nicotine dependence, unspecified, uncomplicated: Secondary | ICD-10-CM | POA: Insufficient documentation

## 2013-10-21 DIAGNOSIS — R630 Anorexia: Secondary | ICD-10-CM | POA: Insufficient documentation

## 2013-10-21 DIAGNOSIS — N898 Other specified noninflammatory disorders of vagina: Secondary | ICD-10-CM | POA: Insufficient documentation

## 2013-10-21 DIAGNOSIS — Z3202 Encounter for pregnancy test, result negative: Secondary | ICD-10-CM | POA: Insufficient documentation

## 2013-10-21 DIAGNOSIS — R1031 Right lower quadrant pain: Secondary | ICD-10-CM | POA: Insufficient documentation

## 2013-10-21 DIAGNOSIS — R6883 Chills (without fever): Secondary | ICD-10-CM | POA: Insufficient documentation

## 2013-10-21 DIAGNOSIS — R109 Unspecified abdominal pain: Secondary | ICD-10-CM

## 2013-10-21 DIAGNOSIS — I10 Essential (primary) hypertension: Secondary | ICD-10-CM | POA: Insufficient documentation

## 2013-10-21 DIAGNOSIS — Z8669 Personal history of other diseases of the nervous system and sense organs: Secondary | ICD-10-CM | POA: Insufficient documentation

## 2013-10-21 LAB — CBC WITH DIFFERENTIAL/PLATELET
Basophils Absolute: 0 10*3/uL (ref 0.0–0.1)
HCT: 39.1 % (ref 36.0–46.0)
Lymphocytes Relative: 18 % (ref 12–46)
Monocytes Absolute: 0.7 10*3/uL (ref 0.1–1.0)
Neutro Abs: 5.6 10*3/uL (ref 1.7–7.7)
Platelets: 274 10*3/uL (ref 150–400)
RDW: 13.2 % (ref 11.5–15.5)
WBC: 7.9 10*3/uL (ref 4.0–10.5)

## 2013-10-21 LAB — WET PREP, GENITAL: Yeast Wet Prep HPF POC: NONE SEEN

## 2013-10-21 LAB — COMPREHENSIVE METABOLIC PANEL
ALT: 56 U/L — ABNORMAL HIGH (ref 0–35)
AST: 53 U/L — ABNORMAL HIGH (ref 0–37)
Alkaline Phosphatase: 66 U/L (ref 39–117)
CO2: 27 mEq/L (ref 19–32)
Chloride: 103 mEq/L (ref 96–112)
GFR calc non Af Amer: >90 mL/min (ref >90)
Sodium: 140 mEq/L (ref 135–145)
Total Bilirubin: 0.5 mg/dL (ref 0.3–1.2)

## 2013-10-21 LAB — URINALYSIS, ROUTINE W REFLEX MICROSCOPIC
Ketones, ur: NEGATIVE mg/dL
Nitrite: NEGATIVE
Protein, ur: NEGATIVE mg/dL
Urobilinogen, UA: 2 mg/dL — ABNORMAL HIGH (ref 0.0–1.0)

## 2013-10-21 LAB — URINE MICROSCOPIC-ADD ON

## 2013-10-21 MED ORDER — METRONIDAZOLE 500 MG PO TABS: 500.0000 mg | ORAL_TABLET | Freq: Two times a day (BID) | ORAL | Status: DC

## 2013-10-21 MED ORDER — HYDROMORPHONE HCL PF 1 MG/ML IJ SOLN
1.0000 mg | Freq: Once | INTRAMUSCULAR | Status: AC
  Administered 2013-10-21: 1 mg via INTRAVENOUS
  Filled 2013-10-21: qty 1

## 2013-10-21 MED ORDER — IOHEXOL 300 MG/ML  SOLN
100.0000 mL | Freq: Once | INTRAMUSCULAR | Status: AC | PRN
  Administered 2013-10-21: 100 mL via INTRAVENOUS

## 2013-10-21 MED ORDER — ONDANSETRON HCL 4 MG/2ML IJ SOLN
4.0000 mg | Freq: Once | INTRAMUSCULAR | Status: AC
  Administered 2013-10-21: 4 mg via INTRAVENOUS
  Filled 2013-10-21: qty 2

## 2013-10-21 MED ORDER — SODIUM CHLORIDE 0.9 % IV SOLN
INTRAVENOUS | Status: DC
  Administered 2013-10-21: 14:00:00 via INTRAVENOUS

## 2013-10-21 MED ORDER — MAGNESIUM CITRATE PO SOLN: 1.0000 | Freq: Once | ORAL | Status: DC

## 2013-10-21 MED ORDER — IOHEXOL 300 MG/ML  SOLN
50.0000 mL | Freq: Once | INTRAMUSCULAR | Status: AC | PRN
  Administered 2013-10-21: 50 mL via ORAL

## 2013-10-21 NOTE — ED Provider Notes (Signed)
CSN: [id number]     Arrival date & time 10/21/13  1056 History   First MD Initiated Contact with Patient 10/21/13 1206     Chief Complaint  Patient presents with  . Abdominal Pain   (Consider location/radiation/quality/duration/timing/severity/associated sxs/prior Treatment) Patient is a 27 y.o. female presenting with abdominal pain. The history is provided by the patient.  Abdominal Pain Pain location:  RLQ Pain quality: aching and dull   Pain radiates to:  Does not radiate Onset quality:  Sudden Duration:  24 hours Timing:  Constant Progression:  Worsening Chronicity:  New Context: not alcohol use, not diet changes, not previous surgeries, not recent illness, not sick contacts and not suspicious food intake   Relieved by:  Nothing Worsened by:  Movement Ineffective treatments:  NSAIDs Associated symptoms: anorexia and chills   Associated symptoms: no chest pain, no constipation, no cough, no diarrhea, no dysuria, no fever, no hematemesis, no hematuria, no nausea, no shortness of breath, no sore throat, no vaginal bleeding, no vaginal discharge and no vomiting   Risk factors: no alcohol abuse, no aspirin use, not elderly, has not had multiple surgeries, not obese and not pregnant    [name] is a 27 y.o. female who presents to the ED with abdominal pain. IUD for birth control. Pain started yesterday. Had sex after pain started and did have some pain. Denies nausea, vomiting, fever, chills or any other problems.   Past Medical History  Diagnosis Date  . Hypertension   . Heart murmur   . Seizures    Past Surgical History  Procedure Laterality Date  . No past surgeries     Family History  Problem Relation Age of Onset  . Cancer Mother   . Diabetes Mother   . Hypertension Father    History  Substance Use Topics  . Smoking status: Current Every Day Smoker -- 0.50 packs/day    Types: Cigarettes    Last Attempt to Quit: 07/08/2011  . Smokeless tobacco: Not on file   . Alcohol Use: No   OB History   Grav Para Term Preterm Abortions TAB SAB Ect Mult Living   6 4 4  2  2   4      Review of Systems  Constitutional: Positive for chills. Negative for fever.  HENT: Negative.  Negative for sore throat.   Eyes: Negative for pain, redness and visual disturbance.  Respiratory: Negative for cough and shortness of breath.   Cardiovascular: Negative for chest pain.  Gastrointestinal: Positive for abdominal pain and anorexia. Negative for nausea, vomiting, diarrhea, constipation and hematemesis.  Genitourinary: Negative for dysuria, hematuria, vaginal bleeding and vaginal discharge.  Musculoskeletal: Negative for back pain and myalgias.  Skin: Negative for rash.  Allergic/Immunologic: Negative for immunocompromised state.  Psychiatric/Behavioral: Negative for confusion. The patient is not nervous/anxious.     Allergies  Amoxicillin and Penicillins cross reactors  Home Medications  No current outpatient prescriptions on file. BP 133/74  Pulse 98  Temp(Src) 99.6 F (37.6 C) (Oral)  Resp 20  Ht 5' (1.524 m)  Wt 110 lb (49.896 kg)  BMI 21.48 kg/m2  SpO2 99%  LMP 10/13/2013 Physical Exam  Nursing note and vitals reviewed. Constitutional: She is oriented to person, place, and time. She appears well-developed and well-nourished.  HENT:  Head: Normocephalic and atraumatic.  Right Ear: External ear normal.  Left Ear: External ear normal.  Eyes: Conjunctivae and EOM are normal.  Neck: Neck supple.  Cardiovascular: Normal rate and regular rhythm.  Pulmonary/Chest: Effort normal and breath sounds normal.  Abdominal: Soft. Bowel sounds are normal. There is tenderness in the right lower quadrant. There is rebound and guarding. There is no rigidity and no CVA tenderness.  Genitourinary:  External genitalia without lesions. Malodorous discharge vaginal vault. No CMT, mild right adnexal tenderness. Uterus without palpable enlargement.   Musculoskeletal:  Normal range of motion.  Neurological: She is alert and oriented to person, place, and time. No cranial nerve deficit.  Skin: Skin is warm and dry.  Psychiatric: She has a normal mood and affect. Her behavior is normal.   Results for orders placed during the hospital encounter of 10/21/13 (from the past 24 hour(s))  URINALYSIS, ROUTINE W REFLEX MICROSCOPIC     Status: Abnormal   Collection Time    10/21/13 11:12 AM      Result Value Range   Color, Urine AMBER (*) YELLOW   APPearance CLOUDY (*) CLEAR   Specific Gravity, Urine 1.028  1.005 - 1.030   pH 6.0  5.0 - 8.0   Glucose, UA NEGATIVE  NEGATIVE mg/dL   Hgb urine dipstick NEGATIVE  NEGATIVE   Bilirubin Urine SMALL (*) NEGATIVE   Ketones, ur NEGATIVE  NEGATIVE mg/dL   Protein, ur NEGATIVE  NEGATIVE mg/dL   Urobilinogen, UA 2.0 (*) 0.0 - 1.0 mg/dL   Nitrite NEGATIVE  NEGATIVE   Leukocytes, UA TRACE (*) NEGATIVE  PREGNANCY, URINE     Status: None   Collection Time    10/21/13 11:12 AM      Result Value Range   Preg Test, Ur NEGATIVE  NEGATIVE  URINE MICROSCOPIC-ADD ON     Status: Abnormal   Collection Time    10/21/13 11:12 AM      Result Value Range   Squamous Epithelial / LPF FEW (*) RARE   WBC, UA 3-6  <3 WBC/hpf   Bacteria, UA MANY (*) RARE   Urine-Other MUCOUS PRESENT    CBC WITH DIFFERENTIAL     Status: None   Collection Time    10/21/13  1:05 PM      Result Value Range   WBC 7.9  4.0 - 10.5 K/uL   RBC 4.34  3.87 - 5.11 MIL/uL   Hemoglobin 13.0  12.0 - 15.0 g/dL   HCT 84.6  96.2 - 95.2 %   MCV 90.1  78.0 - 100.0 fL   MCH 30.0  26.0 - 34.0 pg   MCHC 33.2  30.0 - 36.0 g/dL   RDW 84.1  32.4 - 40.1 %   Platelets 274  150 - 400 K/uL   Neutrophils Relative % 71  43 - 77 %   Neutro Abs 5.6  1.7 - 7.7 K/uL   Lymphocytes Relative 18  12 - 46 %   Lymphs Abs 1.4  0.7 - 4.0 K/uL   Monocytes Relative 9  3 - 12 %   Monocytes Absolute 0.7  0.1 - 1.0 K/uL   Eosinophils Relative 2  0 - 5 %   Eosinophils Absolute 0.1  0.0 -  0.7 K/uL   Basophils Relative 0  0 - 1 %   Basophils Absolute 0.0  0.0 - 0.1 K/uL  COMPREHENSIVE METABOLIC PANEL     Status: Abnormal   Collection Time    10/21/13  1:05 PM      Result Value Range   Sodium 140  135 - 145 mEq/L   Potassium 4.0  3.5 - 5.1 mEq/L   Chloride 103  96 -  112 mEq/L   CO2 27  19 - 32 mEq/L   Glucose, Bld 91  70 - 99 mg/dL   BUN 11  6 - 23 mg/dL   Creatinine, Ser 1.61  0.50 - 1.10 mg/dL   Calcium 9.2  8.4 - 09.6 mg/dL   Total Protein 7.5  6.0 - 8.3 g/dL   Albumin 3.6  3.5 - 5.2 g/dL   AST 53 (*) 0 - 37 U/L   ALT 56 (*) 0 - 35 U/L   Alkaline Phosphatase 66  39 - 117 U/L   Total Bilirubin 0.5  0.3 - 1.2 mg/dL   GFR calc non Af Amer >90  >90 mL/min   GFR calc Af Amer >90  >90 mL/min  WET PREP, GENITAL     Status: Abnormal   Collection Time    10/21/13  3:01 PM      Result Value Range   Yeast Wet Prep HPF POC NONE SEEN  NONE SEEN   Trich, Wet Prep NONE SEEN  NONE SEEN   Clue Cells Wet Prep HPF POC MODERATE (*) NONE SEEN   WBC, Wet Prep HPF POC TOO NUMEROUS TO COUNT (*) NONE SEEN    ED Course: Discussed with Dr. Judd Lien  Procedures (including critical care time) Labs Review Ct Abdomen Pelvis W Contrast  10/21/2013   CLINICAL DATA:  Right lower quadrant pain for several days  EXAM: CT ABDOMEN AND PELVIS WITH CONTRAST  TECHNIQUE: Multidetector CT imaging of the abdomen and pelvis was performed using the standard protocol following bolus administration of intravenous contrast.  CONTRAST:  OMNIPAQUE IOHEXOL 300 MG/ML SOLN, 50mL OMNIPAQUE IOHEXOL 300 MG/ML SOLN  COMPARISON:  10/17/2013  FINDINGS: Lung bases are unremarkable. Sagittal images of the spine are unremarkable. Liver, spleen, pancreas and adrenals are unremarkable. Gallbladder is contracted without evidence of calcified gallstones. Abdominal aorta is unremarkable.  Abundant colonic stool noted in right colon and transverse colon. Abundant stool noted within cecum. There is abundant stool in sigmoid colon  and distal left colon.  Kidneys are symmetrical is in size and enhancement. No hydronephrosis or hydroureter.  Liver, spleen, pancreas and adrenals are unremarkable. There is no pericecal inflammation. The appendix is not identified.  No small bowel obstruction.  No ascites or free air.  No adenopathy.  Retroflexed uterus is noted. Mild congested left adnexal vessels. No pelvic free fluid. Urinary bladder is unremarkable.  No adnexal masses noted.  No destructive bony lesions are noted.  IMPRESSION: 1. Abundant colonic stool. No pericecal inflammation. The appendix is not identified. 2. No hydronephrosis or hydroureter. 3. Retroflexed uterus.  Mild congested left adnexal vessels.  Radiation exposure alert: Since 2010 this patient underwent 8 CT scan examination. To prevent further radiation exposure in the future non radiation studies such as MRI or ultrasound should be performed.   Electronically Signed   By: Natasha Mead M.D.   On: 10/21/2013 14:53    Dr. Judd Lien in to examine the patient. He does not feel that she has an acute abdomen. Pain most likely due to stool in the colon. Will treat with mag citrate and mira lax.  MDM: consult with Dr. Erin Fulling GYN on call. Since patient does not have ovarian cyst on CT is doubtful that she has torsion.    27 y.o. female with abdominal pain x 24 hours. I have reviewed this patient's vital signs, nurses notes, appropriate labs and imaging.  I have discussed findings with the patient and need for follow up. She  voices understanding. Patient stable for discharge without any further screening at this time. She will follow up with her PCP for elevated LFT's and pain. She will return here as needed.    Medication List         magnesium citrate Soln  Take 296 mLs (1 Bottle total) by mouth once.     metroNIDAZOLE 500 MG tablet  Commonly known as:  FLAGYL  Take 1 tablet (500 mg total) by mouth 2 (two) times daily.          Janne Napoleon, [place] 10/21/13 [phone]

## 2013-10-23 LAB — URINE CULTURE
Colony Count: NO GROWTH
Culture: NO GROWTH

## 2013-10-23 NOTE — ED Provider Notes (Signed)
Medical screening examination/treatment/procedure(s) were performed by non-physician practitioner and as supervising physician I was immediately available for consultation/collaboration.     Geoffery Lyons, MD 10/23/13 2308

## 2013-10-26 ENCOUNTER — Telehealth (HOSPITAL_COMMUNITY): Payer: Self-pay | Admitting: Emergency Medicine

## 2013-10-27 NOTE — ED Notes (Signed)
Unable to contact patient via phone. Sent letter. °

## 2014-09-16 ENCOUNTER — Encounter (HOSPITAL_BASED_OUTPATIENT_CLINIC_OR_DEPARTMENT_OTHER): Payer: Self-pay | Admitting: Emergency Medicine

## 2014-11-27 ENCOUNTER — Encounter (HOSPITAL_COMMUNITY): Payer: Self-pay | Admitting: Emergency Medicine

## 2014-11-27 ENCOUNTER — Emergency Department (HOSPITAL_COMMUNITY)
Admission: EM | Admit: 2014-11-27 | Discharge: 2014-11-27 | Disposition: A | Discharge status: Rehabilitation Facility | Payer: No Typology Code available for payment source | Attending: Emergency Medicine | Admitting: Emergency Medicine

## 2014-11-27 DIAGNOSIS — Z79899 Other long term (current) drug therapy: Secondary | ICD-10-CM | POA: Insufficient documentation

## 2014-11-27 DIAGNOSIS — Z3202 Encounter for pregnancy test, result negative: Secondary | ICD-10-CM | POA: Insufficient documentation

## 2014-11-27 DIAGNOSIS — F1110 Opioid abuse, uncomplicated: Secondary | ICD-10-CM | POA: Insufficient documentation

## 2014-11-27 DIAGNOSIS — Z720 Tobacco use: Secondary | ICD-10-CM | POA: Insufficient documentation

## 2014-11-27 DIAGNOSIS — F1210 Cannabis abuse, uncomplicated: Secondary | ICD-10-CM | POA: Insufficient documentation

## 2014-11-27 DIAGNOSIS — F1010 Alcohol abuse, uncomplicated: Secondary | ICD-10-CM | POA: Insufficient documentation

## 2014-11-27 DIAGNOSIS — I10 Essential (primary) hypertension: Secondary | ICD-10-CM | POA: Insufficient documentation

## 2014-11-27 DIAGNOSIS — R011 Cardiac murmur, unspecified: Secondary | ICD-10-CM | POA: Insufficient documentation

## 2014-11-27 DIAGNOSIS — F419 Anxiety disorder, unspecified: Secondary | ICD-10-CM | POA: Insufficient documentation

## 2014-11-27 DIAGNOSIS — Z880 Allergy status to penicillin: Secondary | ICD-10-CM | POA: Insufficient documentation

## 2014-11-27 DIAGNOSIS — F1410 Cocaine abuse, uncomplicated: Secondary | ICD-10-CM | POA: Insufficient documentation

## 2014-11-27 DIAGNOSIS — F1910 Other psychoactive substance abuse, uncomplicated: Secondary | ICD-10-CM

## 2014-11-27 LAB — CBC
HCT: 41.4 % (ref 36.0–46.0)
Hemoglobin: 13.9 g/dL (ref 12.0–15.0)
MCH: 29.1 pg (ref 26.0–34.0)
MCHC: 33.6 g/dL (ref 30.0–36.0)
MCV: 86.6 fL (ref 78.0–100.0)
PLATELETS: 260 10*3/uL (ref 150–400)
RBC: 4.78 MIL/uL (ref 3.87–5.11)
RDW: 13.3 % (ref 11.5–15.5)
WBC: 7.6 10*3/uL (ref 4.0–10.5)

## 2014-11-27 LAB — COMPREHENSIVE METABOLIC PANEL
ALK PHOS: 69 U/L (ref 39–117)
ALT: 84 U/L — AB (ref 0–35)
AST: 90 U/L — ABNORMAL HIGH (ref 0–37)
Albumin: 4.2 g/dL (ref 3.5–5.2)
Anion gap: 12 (ref 5–15)
BUN: 11 mg/dL (ref 6–23)
CO2: 26 mmol/L (ref 19–32)
Calcium: 9.2 mg/dL (ref 8.4–10.5)
Chloride: 101 mEq/L (ref 96–112)
Creatinine, Ser: 0.79 mg/dL (ref 0.50–1.10)
GFR calc Af Amer: >90 mL/min (ref >90)
GFR calc non Af Amer: >90 mL/min (ref >90)
Glucose, Bld: 98 mg/dL (ref 70–99)
Potassium: 3.6 mmol/L (ref 3.5–5.1)
Sodium: 139 mmol/L (ref 135–145)
TOTAL PROTEIN: 7 g/dL (ref 6.0–8.3)
Total Bilirubin: 0.5 mg/dL (ref 0.3–1.2)

## 2014-11-27 LAB — RAPID URINE DRUG SCREEN, HOSP PERFORMED
Amphetamines: NOT DETECTED
BENZODIAZEPINES: NOT DETECTED
Barbiturates: NOT DETECTED
Cocaine: POSITIVE — AB
Opiates: POSITIVE — AB
Tetrahydrocannabinol: POSITIVE — AB

## 2014-11-27 LAB — POC URINE PREG, ED: Preg Test, Ur: NEGATIVE

## 2014-11-27 LAB — SALICYLATE LEVEL: Salicylate Lvl: <4 mg/dL (ref 2.8–20.0)

## 2014-11-27 LAB — TROPONIN I

## 2014-11-27 LAB — ETHANOL: Alcohol, Ethyl (B): <5 mg/dL (ref 0–9)

## 2014-11-27 LAB — ACETAMINOPHEN LEVEL

## 2014-11-27 MED ORDER — CITALOPRAM HYDROBROMIDE 40 MG PO TABS: 40.0000 mg | ORAL_TABLET | Freq: Every day | ORAL | Status: DC

## 2014-11-27 MED ORDER — LORAZEPAM 1 MG PO TABS: 0.0000 mg | ORAL_TABLET | Freq: Two times a day (BID) | ORAL | Status: DC

## 2014-11-27 MED ORDER — VITAMIN B-1 100 MG PO TABS: 100.0000 mg | ORAL_TABLET | Freq: Every day | ORAL | Status: DC

## 2014-11-27 MED ORDER — GABAPENTIN 300 MG PO CAPS: ORAL_CAPSULE | ORAL | Status: DC

## 2014-11-27 MED ORDER — LORAZEPAM 1 MG PO TABS: 0.0000 mg | ORAL_TABLET | Freq: Four times a day (QID) | ORAL | Status: DC

## 2014-11-27 MED ORDER — GABAPENTIN 300 MG PO CAPS: 100.0000 mg | ORAL_CAPSULE | Freq: Three times a day (TID) | ORAL | Status: DC

## 2014-11-27 NOTE — ED Notes (Addendum)
Spoke to [facility] in [place]. They have beds available. Request referral be sent when pt medically cleared

## 2014-11-27 NOTE — ED Notes (Signed)
Robert from [facility] has called and they have a bed for patient.

## 2014-11-27 NOTE — ED Notes (Signed)
Called [facility] to check on fax. Per Molly Madurorobert they are reviewing her now, they will call back with a decision

## 2014-11-27 NOTE — ED Notes (Signed)
Faxed referral to [facility] in [place]

## 2014-11-27 NOTE — ED Notes (Signed)
Case manager given pt rx to get filled.

## 2014-11-27 NOTE — ED Provider Notes (Signed)
CSN: [id number]     Arrival date & time 11/27/14  1543 History   First MD Initiated Contact with Patient 11/27/14 1641     Chief Complaint  Patient presents with  . Addiction Problem  . Medical Clearance     (Consider location/radiation/quality/duration/timing/severity/associated sxs/prior Treatment) The history is provided by the patient. No language interpreter was used.  [name] is a 29 year old female with past medical history of hypertension, heart murmur, seizures presenting to emergency department with request for detox from heroin, cocaine, alcohol. Patient reported that she's been using heroin and cocaine for approximately 2 years. Reports she uses her when every day, IV, uses at least 1 g per day. Patient reported that her last dose was this morning. Patient reported that she's been using cocaine every so often, last use 2 days ago approximately 20 g. Patient reports she's been using alcohol every day, reported that she drinks approximate two 24 ounces to 40 ounces per day mainly of beer stated that her last use was this morning-reported that she's been using alcohol for approximately 4-5 years. Stated that she's been having poor sleep as well as decreased appetite. Patient reported that she was admitted one time before regarding an overdose for her when, reported this occurred approximate year and half ago. Stated that her father's alcoholic. Stated that she currently lives with her father. Patient reported that she does have a history of seizures, reported that her last seizure was approximately 3 years ago-denied being on medication. Denied chest pain, shortness of breath, difficulty breathing, vomiting, diarrhea, abdominal pain, melena, hematochezia, depression, suicidal ideation, homicidal ideation, auditory and visual hallucinations, headache, dizziness, blurred vision, sudden loss of vision, diaphoresis. PCP none  Past Medical History  Diagnosis Date  . Hypertension   .  Heart murmur   . Seizures    Past Surgical History  Procedure Laterality Date  . No past surgeries     Family History  Problem Relation Age of Onset  . Cancer Mother   . Diabetes Mother   . Hypertension Father    History  Substance Use Topics  . Smoking status: Current Every Day Smoker -- 0.50 packs/day    Types: Cigarettes    Last Attempt to Quit: 07/08/2011  . Smokeless tobacco: Not on file  . Alcohol Use: Yes   OB History    Gravida Para Term Preterm AB TAB SAB Ectopic Multiple Living   6 4 4  2  2   4      Review of Systems  Constitutional: Negative for fever and chills.  HENT: Negative for trouble swallowing.   Eyes: Negative for visual disturbance.  Respiratory: Negative for chest tightness and shortness of breath.   Cardiovascular: Negative for chest pain.  Gastrointestinal: Negative for nausea, vomiting, abdominal pain and diarrhea.  Musculoskeletal: Negative for back pain, neck pain and neck stiffness.  Neurological: Negative for dizziness, weakness, numbness and headaches.  Psychiatric/Behavioral: Negative for suicidal ideas, hallucinations, behavioral problems, confusion, sleep disturbance, self-injury, dysphoric mood and decreased concentration. The patient is nervous/anxious. The patient is not hyperactive.       Allergies  Amoxicillin and Penicillins cross reactors  Home Medications   Prior to Admission medications   Medication Sig Start Date End Date Taking? Authorizing Provider  gabapentin (NEURONTIN) 600 MG tablet Take 600 mg by mouth 3 (three) times daily.   Yes Historical Provider, MD  citalopram (CELEXA) 40 MG tablet Take 1 tablet (40 mg total) by mouth daily. 11/27/14   Jomarie LongsJoseph  Purnell Shoemaker, MD  citalopram (CELEXA) 40 MG tablet Take 1 tablet (40 mg total) by mouth daily. 11/27/14   Benny Lennert, MD  gabapentin (NEURONTIN) 300 MG capsule Take 1 capsule (300 mg total) by mouth 3 (three) times daily. 11/27/14   Benny Lennert, MD  gabapentin (NEURONTIN)  300 MG capsule 2 pills three times a day 11/27/14   Benny Lennert, MD   BP 131/89 mmHg  Pulse 79  Temp(Src) 98.7 F (37.1 C) (Oral)  Resp 20  SpO2 100% Physical Exam  Constitutional: She is oriented to person, place, and time. She appears well-developed and well-nourished. No distress.  HENT:  Head: Normocephalic and atraumatic.  Mouth/Throat: Oropharynx is clear and moist. No oropharyngeal exudate.  Eyes: Conjunctivae and EOM are normal. Pupils are equal, round, and reactive to light. Right eye exhibits no discharge. Left eye exhibits no discharge.  Neck: Normal range of motion. Neck supple. No tracheal deviation present.  Negative neck stiffness Negative nuchal rigidity Negative cervical lymphadenopathy Negative meningeal signs  Cardiovascular: Normal rate, regular rhythm and normal heart sounds.  Exam reveals no friction rub.   No murmur heard. Pulmonary/Chest: Effort normal and breath sounds normal. No respiratory distress. She has no wheezes. She has no rales.  Patient is able to speak in full sentences without difficulty Negative use of accessory muscles Negative stridor  Abdominal: Soft. Bowel sounds are normal. She exhibits no distension. There is no tenderness. There is no rebound and no guarding.  Negative abdominal distention Bowel sounds normal active in all 4 quadrants Abdomen soft upon palpation   Musculoskeletal: Normal range of motion.  Full ROM to upper and lower extremities without difficulty noted, negative ataxia noted.  Lymphadenopathy:    She has no cervical adenopathy.  Neurological: She is alert and oriented to person, place, and time. No cranial nerve deficit. She exhibits normal muscle tone. Coordination normal.  Cranial nerves III-XII grossly intact Strength 5+/5+ to upper and lower extremities bilaterally with resistance applied, equal distribution noted Equal grip strength bilaterally Negative facial droop Negative slurred speech Negative  erythema Patient follows commands well Patient response to questions appropriately Negative asterixis Negative arm drift Fine motor skills intact  Skin: Skin is warm and dry. No rash noted. She is not diaphoretic. No erythema.  Psychiatric: She has a normal mood and affect. Her behavior is normal. Thought content normal.  Nursing note and vitals reviewed.   ED Course  Procedures (including critical care time)  Results for orders placed or performed during the hospital encounter of 11/27/14  Acetaminophen level  Result Value Ref Range   Acetaminophen (Tylenol), Serum <10.0 (L) 10 - 30 ug/mL  CBC  Result Value Ref Range   WBC 7.6 4.0 - 10.5 K/uL   RBC 4.78 3.87 - 5.11 MIL/uL   Hemoglobin 13.9 12.0 - 15.0 g/dL   HCT 16.1 09.6 - 04.5 %   MCV 86.6 78.0 - 100.0 fL   MCH 29.1 26.0 - 34.0 pg   MCHC 33.6 30.0 - 36.0 g/dL   RDW 40.9 81.1 - 91.4 %   Platelets 260 150 - 400 K/uL  Comprehensive metabolic panel  Result Value Ref Range   Sodium 139 135 - 145 mmol/L   Potassium 3.6 3.5 - 5.1 mmol/L   Chloride 101 96 - 112 mEq/L   CO2 26 19 - 32 mmol/L   Glucose, Bld 98 70 - 99 mg/dL   BUN 11 6 - 23 mg/dL   Creatinine, Ser 7.82 0.50 -  1.10 mg/dL   Calcium 9.2 8.4 - 96.0 mg/dL   Total Protein 7.0 6.0 - 8.3 g/dL   Albumin 4.2 3.5 - 5.2 g/dL   AST 90 (H) 0 - 37 U/L   ALT 84 (H) 0 - 35 U/L   Alkaline Phosphatase 69 39 - 117 U/L   Total Bilirubin 0.5 0.3 - 1.2 mg/dL   GFR calc non Af Amer >90 >90 mL/min   GFR calc Af Amer >90 >90 mL/min   Anion gap 12 5 - 15  Ethanol (ETOH)  Result Value Ref Range   Alcohol, Ethyl (B) <5 0 - 9 mg/dL  Salicylate level  Result Value Ref Range   Salicylate Lvl <4.0 2.8 - 20.0 mg/dL  Urine Drug Screen  Result Value Ref Range   Opiates POSITIVE (A) NONE DETECTED   Cocaine POSITIVE (A) NONE DETECTED   Benzodiazepines NONE DETECTED NONE DETECTED   Amphetamines NONE DETECTED NONE DETECTED   Tetrahydrocannabinol POSITIVE (A) NONE DETECTED   Barbiturates  NONE DETECTED NONE DETECTED  Troponin I  Result Value Ref Range   Troponin I <0.03 <0.031 ng/mL  POC Urine Pregnancy, (if pre-menopausal female) NOT at [facility]  Result Value Ref Range   Preg Test, Ur NEGATIVE NEGATIVE    Labs Review Labs Reviewed  ACETAMINOPHEN LEVEL - Abnormal; Notable for the following:    Acetaminophen (Tylenol), Serum <10.0 (*)    All other components within normal limits  COMPREHENSIVE METABOLIC PANEL - Abnormal; Notable for the following:    AST 90 (*)    ALT 84 (*)    All other components within normal limits  URINE RAPID DRUG SCREEN (HOSP PERFORMED) - Abnormal; Notable for the following:    Opiates POSITIVE (*)    Cocaine POSITIVE (*)    Tetrahydrocannabinol POSITIVE (*)    All other components within normal limits  CBC  ETHANOL  SALICYLATE LEVEL  TROPONIN I  POC URINE PREG, ED    Imaging Review No results found.   EKG Interpretation   Date/Time:  Wednesday November 27 2014 16:50:31 EST Ventricular Rate:  80 PR Interval:  120 QRS Duration: 90 QT Interval:  415 QTC Calculation: 479 R Axis:   87 Text Interpretation:  Sinus rhythm Right atrial enlargement Borderline  prolonged QT interval When compared with ECG of 03/12/2007, No significant  change was found Confirmed by [facility]  MD, DAVID ([id number]) on 11/27/2014  4:58:37 PM      MDM   Final diagnoses:  Alcohol abuse  Polysubstance abuse    Medications - No data to display  Filed Vitals:   11/27/14 1546 11/27/14 1829  BP: 145/95 131/89  Pulse: 95 79  Temp: 98 F (36.7 C) 98.7 F (37.1 C)  TempSrc:  Oral  Resp: 18 20  SpO2: 99% 100%     EKG noted sinus rhythm with right atrial enlargement, borderline prolonged QT interval-no significant change since last tracing. Troponin negative elevation. CBC negative elevated leukocytosis. Hemoglobin 13.9, hematocrit 41.4. CMP unremarkable-AST mildly elevated at 90, ALT mildly elevated at 84. Glucose 98 with negative elevated anion gap-12.0 mg/L.  Ethanol negative elevation. Acetaminophen and salicylate level negative elevation. Urine pregnancy negative. Urine drug screen positive for cocaine, opiates, cannabis. Patient presenting to the ED with request for detox from alcohol, heroin, cocaine. Patient has a bed available at [facility] for medical clearance. Doubt DTs-patient's vitals are stable and mentating well. Doubt withdrawl from alcohol or illicit drug use. Doubt ACS. Patient stable, afebrile. Patient not septic appearing. Patient  stable for transfer. Patient transferred to [facility], [facility], for detox-patient has been accepted.  Raymon Mutton, PA-C 11/27/14 1850  Hilario Quarry, MD 11/27/14 2149

## 2014-11-27 NOTE — ED Notes (Signed)
Pt here for detox from ETOH, cocaine, heroin; pt sts last use was yesterday; pt denies SI/HI

## 2014-11-27 NOTE — ED Notes (Signed)
Patient resting in bed watching television. She has been given snack due to being hungry and unable to wait for dinner to be delivered.

## 2014-11-27 NOTE — ED Notes (Signed)
Called lab and added on the troponin

## 2014-11-27 NOTE — ED Notes (Signed)
PT BELONGINGS RETURNED TO HER. SHE IS DRESSING AND AWAITING [facility] TO TAKE HER TO [facility].

## 2014-11-27 NOTE — ED Notes (Signed)
Patient has arrived on pod c . She has been placed in blue scrubs. Tech at bedside to draw pt labs

## 2014-11-27 NOTE — ED Notes (Signed)
[facility] CALLED TO TRANSPORT PT 

## 2014-11-27 NOTE — ED Notes (Signed)
Pa in to see patient 

## 2014-11-27 NOTE — ED Notes (Signed)
[facility] HERE TO TRANSPORT. [facility] CALLED. SHE HAS ALL OF HER BELONGINGS, MEDS SENT TO [facility] FOR PATIENT

## 2015-01-06 ENCOUNTER — Inpatient Hospital Stay (HOSPITAL_COMMUNITY): Payer: Self-pay

## 2015-01-06 ENCOUNTER — Encounter (HOSPITAL_COMMUNITY): Payer: Self-pay

## 2015-01-06 ENCOUNTER — Emergency Department (HOSPITAL_COMMUNITY): Payer: Self-pay

## 2015-01-06 ENCOUNTER — Inpatient Hospital Stay (HOSPITAL_COMMUNITY)
Admission: EM | Admit: 2015-01-06 | Discharge: 2015-01-16 | DRG: 580 | Disposition: A | Discharge status: Skilled Nursing Facility | Payer: Self-pay | Attending: Internal Medicine | Admitting: Internal Medicine

## 2015-01-06 DIAGNOSIS — F1190 Opioid use, unspecified, uncomplicated: Secondary | ICD-10-CM | POA: Diagnosis present

## 2015-01-06 DIAGNOSIS — R569 Unspecified convulsions: Secondary | ICD-10-CM

## 2015-01-06 DIAGNOSIS — F1210 Cannabis abuse, uncomplicated: Secondary | ICD-10-CM | POA: Diagnosis present

## 2015-01-06 DIAGNOSIS — R748 Abnormal levels of other serum enzymes: Secondary | ICD-10-CM | POA: Diagnosis present

## 2015-01-06 DIAGNOSIS — Z79899 Other long term (current) drug therapy: Secondary | ICD-10-CM

## 2015-01-06 DIAGNOSIS — G40909 Epilepsy, unspecified, not intractable, without status epilepticus: Secondary | ICD-10-CM

## 2015-01-06 DIAGNOSIS — R609 Edema, unspecified: Secondary | ICD-10-CM

## 2015-01-06 DIAGNOSIS — M7989 Other specified soft tissue disorders: Secondary | ICD-10-CM

## 2015-01-06 DIAGNOSIS — M79609 Pain in unspecified limb: Secondary | ICD-10-CM

## 2015-01-06 DIAGNOSIS — L0390 Cellulitis, unspecified: Secondary | ICD-10-CM

## 2015-01-06 DIAGNOSIS — G4700 Insomnia, unspecified: Secondary | ICD-10-CM | POA: Diagnosis not present

## 2015-01-06 DIAGNOSIS — I10 Essential (primary) hypertension: Secondary | ICD-10-CM | POA: Diagnosis present

## 2015-01-06 DIAGNOSIS — F1990 Other psychoactive substance use, unspecified, uncomplicated: Secondary | ICD-10-CM | POA: Diagnosis present

## 2015-01-06 DIAGNOSIS — B1920 Unspecified viral hepatitis C without hepatic coma: Secondary | ICD-10-CM | POA: Diagnosis present

## 2015-01-06 DIAGNOSIS — L03119 Cellulitis of unspecified part of limb: Secondary | ICD-10-CM | POA: Diagnosis present

## 2015-01-06 DIAGNOSIS — R7881 Bacteremia: Secondary | ICD-10-CM | POA: Diagnosis present

## 2015-01-06 DIAGNOSIS — R768 Other specified abnormal immunological findings in serum: Secondary | ICD-10-CM

## 2015-01-06 DIAGNOSIS — L02413 Cutaneous abscess of right upper limb: Principal | ICD-10-CM | POA: Diagnosis present

## 2015-01-06 DIAGNOSIS — L03113 Cellulitis of right upper limb: Secondary | ICD-10-CM | POA: Diagnosis present

## 2015-01-06 DIAGNOSIS — B950 Streptococcus, group A, as the cause of diseases classified elsewhere: Secondary | ICD-10-CM | POA: Diagnosis present

## 2015-01-06 DIAGNOSIS — E876 Hypokalemia: Secondary | ICD-10-CM | POA: Diagnosis present

## 2015-01-06 DIAGNOSIS — R011 Cardiac murmur, unspecified: Secondary | ICD-10-CM | POA: Diagnosis present

## 2015-01-06 DIAGNOSIS — I82611 Acute embolism and thrombosis of superficial veins of right upper extremity: Secondary | ICD-10-CM | POA: Diagnosis present

## 2015-01-06 DIAGNOSIS — F1010 Alcohol abuse, uncomplicated: Secondary | ICD-10-CM | POA: Diagnosis present

## 2015-01-06 DIAGNOSIS — F1110 Opioid abuse, uncomplicated: Secondary | ICD-10-CM | POA: Diagnosis present

## 2015-01-06 DIAGNOSIS — F17210 Nicotine dependence, cigarettes, uncomplicated: Secondary | ICD-10-CM | POA: Diagnosis present

## 2015-01-06 DIAGNOSIS — B029 Zoster without complications: Secondary | ICD-10-CM | POA: Diagnosis not present

## 2015-01-06 DIAGNOSIS — Z8679 Personal history of other diseases of the circulatory system: Secondary | ICD-10-CM

## 2015-01-06 DIAGNOSIS — F419 Anxiety disorder, unspecified: Secondary | ICD-10-CM | POA: Diagnosis present

## 2015-01-06 DIAGNOSIS — F1910 Other psychoactive substance abuse, uncomplicated: Secondary | ICD-10-CM

## 2015-01-06 DIAGNOSIS — R7301 Impaired fasting glucose: Secondary | ICD-10-CM | POA: Diagnosis present

## 2015-01-06 DIAGNOSIS — B182 Chronic viral hepatitis C: Secondary | ICD-10-CM | POA: Diagnosis present

## 2015-01-06 DIAGNOSIS — F1410 Cocaine abuse, uncomplicated: Secondary | ICD-10-CM | POA: Diagnosis present

## 2015-01-06 HISTORY — DX: Cocaine abuse, uncomplicated: F14.10

## 2015-01-06 HISTORY — DX: Chronic viral hepatitis C: B18.2

## 2015-01-06 LAB — URINALYSIS, ROUTINE W REFLEX MICROSCOPIC
Bilirubin Urine: NEGATIVE
Glucose, UA: NEGATIVE mg/dL
Hgb urine dipstick: NEGATIVE
Ketones, ur: 15 mg/dL — AB
Leukocytes, UA: NEGATIVE
Nitrite: NEGATIVE
Protein, ur: 30 mg/dL — AB
Specific Gravity, Urine: 1.022 (ref 1.005–1.030)
Urobilinogen, UA: 1 mg/dL (ref 0.0–1.0)
pH: 6 (ref 5.0–8.0)

## 2015-01-06 LAB — URINE MICROSCOPIC-ADD ON

## 2015-01-06 LAB — CBC WITH DIFFERENTIAL/PLATELET
Basophils Absolute: 0 10*3/uL (ref 0.0–0.1)
Basophils Relative: 0 % (ref 0–1)
Eosinophils Absolute: 0 10*3/uL (ref 0.0–0.7)
Eosinophils Relative: 0 % (ref 0–5)
HCT: 40.5 % (ref 36.0–46.0)
Hemoglobin: 13.7 g/dL (ref 12.0–15.0)
Lymphocytes Relative: 5 % — ABNORMAL LOW (ref 12–46)
Lymphs Abs: 0.5 10*3/uL — ABNORMAL LOW (ref 0.7–4.0)
MCH: 28.6 pg (ref 26.0–34.0)
MCHC: 33.8 g/dL (ref 30.0–36.0)
MCV: 84.6 fL (ref 78.0–100.0)
Monocytes Absolute: 1.1 10*3/uL — ABNORMAL HIGH (ref 0.1–1.0)
Monocytes Relative: 10 % (ref 3–12)
Neutro Abs: 9.7 10*3/uL — ABNORMAL HIGH (ref 1.7–7.7)
Neutrophils Relative %: 85 % — ABNORMAL HIGH (ref 43–77)
Platelets: 184 10*3/uL (ref 150–400)
RBC: 4.79 MIL/uL (ref 3.87–5.11)
RDW: 14.9 % (ref 11.5–15.5)
WBC: 11.4 10*3/uL — ABNORMAL HIGH (ref 4.0–10.5)

## 2015-01-06 LAB — BASIC METABOLIC PANEL
ANION GAP: 5 (ref 5–15)
Anion gap: 8 (ref 5–15)
BUN: 9 mg/dL (ref 6–23)
BUN: 7 mg/dL (ref 6–23)
CHLORIDE: 112 mmol/L (ref 96–112)
CO2: 21 mmol/L (ref 19–32)
CO2: 20 mmol/L (ref 19–32)
CREATININE: 0.69 mg/dL (ref 0.50–1.10)
Calcium: 8.4 mg/dL (ref 8.4–10.5)
Calcium: 7.6 mg/dL — ABNORMAL LOW (ref 8.4–10.5)
Chloride: 105 mmol/L (ref 96–112)
Creatinine, Ser: 0.92 mg/dL (ref 0.50–1.10)
GFR calc Af Amer: >90 mL/min (ref >90)
GFR calc Af Amer: >90 mL/min (ref >90)
GFR calc non Af Amer: 83 mL/min — ABNORMAL LOW (ref >90)
Glucose, Bld: 148 mg/dL — ABNORMAL HIGH (ref 70–99)
Glucose, Bld: 104 mg/dL — ABNORMAL HIGH (ref 70–99)
POTASSIUM: 3 mmol/L — AB (ref 3.5–5.1)
Potassium: 3.2 mmol/L — ABNORMAL LOW (ref 3.5–5.1)
SODIUM: 137 mmol/L (ref 135–145)
Sodium: 134 mmol/L — ABNORMAL LOW (ref 135–145)

## 2015-01-06 LAB — MRSA PCR SCREENING: MRSA by PCR: NEGATIVE

## 2015-01-06 LAB — RAPID URINE DRUG SCREEN, HOSP PERFORMED
AMPHETAMINES: NOT DETECTED
Barbiturates: NOT DETECTED
Benzodiazepines: NOT DETECTED
Cocaine: POSITIVE — AB
Opiates: POSITIVE — AB
Tetrahydrocannabinol: NOT DETECTED

## 2015-01-06 LAB — POC URINE PREG, ED: Preg Test, Ur: NEGATIVE

## 2015-01-06 MED ORDER — SODIUM CHLORIDE 0.9 % IV BOLUS (SEPSIS)
1000.0000 mL | Freq: Once | INTRAVENOUS | Status: AC
  Administered 2015-01-06: 1000 mL via INTRAVENOUS

## 2015-01-06 MED ORDER — SODIUM CHLORIDE 0.9 % IJ SOLN: 3.0000 mL | INTRAMUSCULAR | Status: DC | PRN

## 2015-01-06 MED ORDER — MORPHINE SULFATE 2 MG/ML IJ SOLN
1.0000 mg | INTRAMUSCULAR | Status: DC | PRN
  Administered 2015-01-06 – 2015-01-08 (×10): 2 mg via INTRAVENOUS
  Filled 2015-01-06 (×10): qty 1

## 2015-01-06 MED ORDER — DEXTROSE 5 % IV SOLN
2.0000 g | Freq: Two times a day (BID) | INTRAVENOUS | Status: DC
  Filled 2015-01-06: qty 2

## 2015-01-06 MED ORDER — ONDANSETRON HCL 4 MG/2ML IJ SOLN
4.0000 mg | Freq: Four times a day (QID) | INTRAMUSCULAR | Status: DC | PRN
  Administered 2015-01-11: 4 mg via INTRAVENOUS
  Filled 2015-01-06: qty 2

## 2015-01-06 MED ORDER — SODIUM CHLORIDE 0.9 % IV SOLN
INTRAVENOUS | Status: DC
  Administered 2015-01-06 (×2): via INTRAVENOUS

## 2015-01-06 MED ORDER — ACETAMINOPHEN 325 MG PO TABS
650.0000 mg | ORAL_TABLET | Freq: Once | ORAL | Status: AC
  Administered 2015-01-06: 650 mg via ORAL
  Filled 2015-01-06: qty 2

## 2015-01-06 MED ORDER — POTASSIUM CHLORIDE CRYS ER 20 MEQ PO TBCR
40.0000 meq | EXTENDED_RELEASE_TABLET | Freq: Once | ORAL | Status: AC
  Administered 2015-01-07: 40 meq via ORAL
  Filled 2015-01-06: qty 2

## 2015-01-06 MED ORDER — ONDANSETRON HCL 4 MG PO TABS
4.0000 mg | ORAL_TABLET | Freq: Four times a day (QID) | ORAL | Status: DC | PRN
  Administered 2015-01-07: 4 mg via ORAL
  Filled 2015-01-06: qty 1

## 2015-01-06 MED ORDER — SODIUM CHLORIDE 0.9 % IJ SOLN: 3.0000 mL | Freq: Two times a day (BID) | INTRAMUSCULAR | Status: DC

## 2015-01-06 MED ORDER — CEFAZOLIN SODIUM-DEXTROSE 2-3 GM-% IV SOLR: 2.0000 g | Freq: Three times a day (TID) | INTRAVENOUS | Status: DC

## 2015-01-06 MED ORDER — DEXTROSE 5 % IV SOLN
2.0000 g | Freq: Once | INTRAVENOUS | Status: AC
  Administered 2015-01-06: 2 g via INTRAVENOUS
  Filled 2015-01-06: qty 2

## 2015-01-06 MED ORDER — HEPARIN SODIUM (PORCINE) 5000 UNIT/ML IJ SOLN
5000.0000 [IU] | Freq: Three times a day (TID) | INTRAMUSCULAR | Status: DC
  Administered 2015-01-06 – 2015-01-07 (×2): 5000 [IU] via SUBCUTANEOUS
  Filled 2015-01-06 (×5): qty 1

## 2015-01-06 MED ORDER — ACETAMINOPHEN 325 MG PO TABS
650.0000 mg | ORAL_TABLET | Freq: Four times a day (QID) | ORAL | Status: DC | PRN
  Administered 2015-01-06 – 2015-01-07 (×2): 650 mg via ORAL
  Filled 2015-01-06 (×2): qty 2

## 2015-01-06 MED ORDER — VANCOMYCIN HCL IN DEXTROSE 1-5 GM/200ML-% IV SOLN
1000.0000 mg | Freq: Once | INTRAVENOUS | Status: AC
  Administered 2015-01-06: 1000 mg via INTRAVENOUS
  Filled 2015-01-06: qty 200

## 2015-01-06 MED ORDER — VANCOMYCIN HCL 500 MG IV SOLR
500.0000 mg | Freq: Once | INTRAVENOUS | Status: AC
  Administered 2015-01-06: 500 mg via INTRAVENOUS
  Filled 2015-01-06: qty 500

## 2015-01-06 MED ORDER — POTASSIUM CHLORIDE CRYS ER 20 MEQ PO TBCR
40.0000 meq | EXTENDED_RELEASE_TABLET | Freq: Once | ORAL | Status: AC
  Administered 2015-01-06: 40 meq via ORAL
  Filled 2015-01-06: qty 2

## 2015-01-06 MED ORDER — SODIUM CHLORIDE 0.9 % IV SOLN: 250.0000 mL | INTRAVENOUS | Status: DC | PRN

## 2015-01-06 MED ORDER — VANCOMYCIN HCL 10 G IV SOLR
1500.0000 mg | INTRAVENOUS | Status: DC
  Administered 2015-01-07: 1500 mg via INTRAVENOUS
  Filled 2015-01-06: qty 1500

## 2015-01-06 MED ORDER — MORPHINE SULFATE 4 MG/ML IJ SOLN
6.0000 mg | Freq: Once | INTRAMUSCULAR | Status: AC
  Administered 2015-01-06: 6 mg via INTRAVENOUS
  Filled 2015-01-06: qty 2

## 2015-01-06 NOTE — H&P (Signed)
Date: 01/06/2015               Patient Name:  [name] MRN: [id number]  DOB: 07/22/1986 Age / Sex: 29 y.o., female   PCP: Pcp Not In System         Medical Service: Internal Medicine Teaching Service         Attending Physician: Dr. Rocco Serene, MD    First Contact: Dr. Beckie Salts Pager: [phone]  Second Contact: Dr. Johna Roles Pager: [phone]       After Hours (After 5p/  First Contact Pager: [phone]  weekends / holidays): Second Contact Pager: [phone]   Chief Complaint: Cellulitis of antecubital fossa  History of Present Illness: 29yo F w/ h/o seizure d/o, HTN, and IVDU presents with fevers and pain in her R antecubital fossa. She states that the pain started 2 days ago with redness and swelling after injecting heroin. She began having fevers last night with a temp of 103F. She states that the pain and swelling in her arm have progressively worsened. She has been using heroin daily for 1 year, and states that she uses clean needles and does not share them. She states that she gets money from her father and uses that money for her drugs. She did go to rehab for her heroin use in addition to cocaine and EtOH abuse, and has not used the cocaine or EtOH since.   Meds: Current Facility-Administered Medications  Medication Dose Route Frequency Provider Last Rate Last Dose  . ceFEPIme (MAXIPIME) 2 g in dextrose 5 % 50 mL IVPB  2 g Intravenous Q12H Lauren Bajbus, RPH      . potassium chloride SA (K-DUR,KLOR-CON) CR tablet 40 mEq  40 mEq Oral Once Genelle Gather, MD      . Melene Muller ON 01/07/2015] vancomycin (VANCOCIN) 1,500 mg in sodium chloride 0.9 % 500 mL IVPB  1,500 mg Intravenous Q24H Lauren Bajbus, RPH      . vancomycin (VANCOCIN) IVPB 1000 mg/200 mL premix  1,000 mg Intravenous Once Richardean Canal, MD       Current Outpatient Prescriptions  Medication Sig Dispense Refill  . citalopram (CELEXA) 40 MG tablet Take 1 tablet (40 mg total) by mouth daily. 10 tablet 0  . gabapentin  (NEURONTIN) 300 MG capsule Take 1 capsule (300 mg total) by mouth 3 (three) times daily. 30 capsule 0  . citalopram (CELEXA) 40 MG tablet Take 1 tablet (40 mg total) by mouth daily. (Patient not taking: Reported on 01/06/2015) 10 tablet 0  . gabapentin (NEURONTIN) 300 MG capsule 2 pills three times a day (Patient not taking: Reported on 01/06/2015) 60 capsule 0  . [DISCONTINUED] amLODipine (NORVASC) 10 MG tablet Take 1 tablet (10 mg total) by mouth daily. 60 tablet 0  . [DISCONTINUED] fluticasone (FLONASE) 50 MCG/ACT nasal spray Place 2 sprays into the nose daily.        Allergies: Allergies as of 01/06/2015 - Review Complete 01/06/2015  Allergen Reaction Noted  . Amoxicillin Other (See Comments) 08/08/2011  . Penicillins cross reactors Hives, Nausea And Vomiting, and Swelling 08/08/2011   Past Medical History  Diagnosis Date  . Hypertension   . Heart murmur   . Seizures    Past Surgical History  Procedure Laterality Date  . No past surgeries     Family History  Problem Relation Age of Onset  . Cancer Mother   . Diabetes Mother   . Hypertension Father    History   Social  History  . Marital Status: Single    Spouse Name: N/A  . Number of Children: N/A  . Years of Education: N/A   Occupational History  . Not on file.   Social History Main Topics  . Smoking status: Current Every Day Smoker -- 0.50 packs/day    Types: Cigarettes    Last Attempt to Quit: 07/08/2011  . Smokeless tobacco: Not on file  . Alcohol Use: Yes  . Drug Use: Yes    Special: Cocaine, Marijuana, IV     Comment: heroin  . Sexual Activity: Yes    Birth Control/ Protection: IUD   Other Topics Concern  . Not on file   Social History Narrative    Review of Systems: Review of Systems  Constitutional: +fever, chills, and pain all over.  HEENT: Denies neck pain or neck stiffness Respiratory: Denies SOB, DOE.  Cardiovascular: Denies chest pain, palpitations, or leg swelling.  Gastrointestinal:  +nausea, vomiting, abdominal pain. Denies diarrhea, constipation.  Genitourinary: Denies dysuria, urgency, frequency Musculoskeletal: Denies joint swelling or gait problem.  Skin: +redness of R antecubital fossa Neurological: Denies dizziness, seizures, syncope. +tingling in the knees and headaches.  Psychiatric/Behavioral: Denies suicidal ideation, mood changes, confusion, or agitation.   Physical Exam: Blood pressure 121/70, pulse 111, temperature 100.6 F (38.1 C), temperature source Oral, resp. rate 24, height 5' (1.524 m), weight 125 lb (56.7 kg), last menstrual period 12/30/2014, SpO2 97 %. General: Alert & oriented x 3, cooperative on examination, in mild distress. Head: Normocephalic and atraumatic.  Eyes: EOMI, pupils equal, round, and reactive to light, no injection and anicteric.  Mouth: Pharynx pink and moist, no erythema or exudates. Poor dentition. Neck: Supple, full ROM.  Lungs: CTAB, normal respiratory effort, no accessory muscle use, no crackles, and no wheezes. Heart: Tachycardic, regular rhythm, no murmur, no gallop, and no rub.  Abdomen: Soft, non-tender, non-distended, normal bowel sounds, no guarding, no rebound tenderness, no organomegaly.  Msk: Moves all 4 extremities. Extremities: 2+ radial pulses bilaterally. No BLE edema. R antecubital fossa with erythema, induration, and warmth.  Neurologic: Alert & oriented X3, cranial nerves II-XII intact, strength normal in all extremities, sensation intact to light touch.  Skin: RUE as noted above.  Psych: Memory intact for recent and remote, normally interactive, good eye contact.    Lab results: Basic Metabolic Panel:  Recent Labs  16/08/9601/22/16 0732  NA 134*  K 3.2*  CL 105  CO2 21  GLUCOSE 148*  BUN 9  CREATININE 0.92  CALCIUM 8.4   CBC:  Recent Labs  01/06/15 0732  WBC 11.4*  NEUTROABS 9.7*  HGB 13.7  HCT 40.5  MCV 84.6  PLT 184   Urine Drug Screen: Drugs of Abuse     Component Value Date/Time     LABOPIA POSITIVE* 11/27/2014 1551   COCAINSCRNUR POSITIVE* 11/27/2014 1551   LABBENZ NONE DETECTED 11/27/2014 1551   AMPHETMU NONE DETECTED 11/27/2014 1551   THCU POSITIVE* 11/27/2014 1551   LABBARB NONE DETECTED 11/27/2014 1551    Urinalysis: Pending   Imaging results:  Dg Chest 2 View  01/06/2015   CLINICAL DATA:  Right-sided arm pain and swelling. IV drug use on same side  EXAM: CHEST  2 VIEW  COMPARISON:  None.  FINDINGS: Normal mediastinum and cardiac silhouette. Normal pulmonary vasculature. No evidence of effusion, infiltrate, or pneumothorax. No acute bony abnormality.  IMPRESSION: Normal chest radiograph.   Electronically Signed   By: Genevive BiStewart  Edmunds M.D.   On: 01/06/2015 09:35  Other results: EKG: Sinus tachycardia. Artifact presents, but EKG appears unchanged from previous.   Assessment & Plan by Problem: 29yo F w/ h/o seizure d/o, HTN, and IVDU presents with fevers and cellulitis of the R antecubital fossa.  #Cellulitis of antecubital fossa: Pt is IVDU, and with pain and swelling of R AC fossa x2 days after injecting heroin at the site. Arm is erythematous and indurated. Possibly with fluctuance- no u/s performed in ED. Cefepime and Vanc started in the ED to cover for MRSA and gram negatives. She has a reported h/o murmur, which she states she has had her whole life. The EDP noted a 2/6 systolic murmur on exam, but I do not appreciate a murmur on my exam. Obviously, in the setting of her IVDU with the cellulitis and fevers, there is concern for infectious endocarditis, which must be ruled out. Pt does meet sepsis criteria: T 102.1, HR 110, RR 24, in setting of infection. WBC 11.4. - Admit to IMTS to Tele - TTE, will likely need TEE - BCx x2 drawn in ED - Continue Vanc and Cefepime for now, and will discuss with ID - Checking HIV - Morphine 1-2mg  q3h PRN, suspect pain control will be difficult in the setting of heroin use. - NS bolus 2L, then /hr x10hrs  #IVDU  (intravenous drug user): Pt uses heroin daily x1 year. She will be receing Morphine for pain control, so withdraw is unlikely. Pt failed recent rehab attempt, but will consult CSW for assistance with cessation.  #Hypokalemia: K 3.2 on admission.  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 She is unsure of the cause, but no seizures in 3 years, per pt. She is not on medication for her seizures. - Seizure precautions  #Hypertension: Pt not on medication and BP normal, but in the setting of possible sepsis. Will continue to monitor.   #H/O cardiac murmur: W/u as noted above. Checking ECHO.  #DVT PPx: [place] Heparin   Dispo: Disposition is deferred at this time, awaiting improvement of current medical problems. Anticipated discharge in approximately 2-3 day(s).   The patient does not have a current PCP (Pcp Not In System) and may need an [facility] hospital follow-up appointment after discharge.  The patient does not have transportation limitations that hinder transportation to clinic appointments.  Signed: Genelle Gather, MD 01/06/2015, 10:02 AM

## 2015-01-06 NOTE — ED Notes (Signed)
Pt coming from home, arm is red, swollen and painful. Pt reports IV drug use to that extremity. Last use 2 days ago when she noticed the redness. Pt a&o x4.

## 2015-01-06 NOTE — Progress Notes (Signed)
NURSING PROGRESS NOTE  Jamie FrederickKrystal D Mccurley [id number] Admission Data: 01/06/2015 6:30 PM Attending Provider: Rocco SereneLawrence D Klima, MD PCP:Pcp Not In System Code Status: full  Allergies:  Amoxicillin and Penicillins cross reactors Past Medical History:   has a past medical history of Hypertension; Heart murmur; and Seizures. Pt is current IV Heroin drug user. Past Surgical History:   has past surgical history that includes No past surgeries. Social History:   reports that she has been smoking Cigarettes.  She has been smoking about 0.50 packs per day. She does not have any smokeless tobacco history on file. She reports that she uses illicit drugs (Cocaine, Marijuana, IV, and Heroin). She reports that she does not drink alcohol.  [name] is a 29 y.o. female patient admitted from ED:   Last Documented Vital Signs: Blood pressure 118/71, pulse 108, temperature 100 F (37.8 C), temperature source Oral, resp. rate 18, height 5' (1.524 m), weight 55.1 kg (121 lb 7.6 oz), last menstrual period 12/30/2014, SpO2 98 %.  Cardiac Monitoring: Box # 2 in place. Cardiac monitor yields: sinus tachycardia.  IV Fluids:  IV in place, occlusive dsg intact without redness, IV cath hand left, condition patent and no redness normal saline.   Skin: L Arm cellulitis marked/circled in marker in ED prior to floor admission.  Patient/Family orientated to room. Information packet given to patient/family. Admission inpatient armband information verified with patient/family to include name and date of birth and placed on patient arm. Side rails up x 2, fall assessment and education completed with patient/family. Patient/family able to verbalize understanding of risk associated with falls and verbalized understanding to call for assistance before getting out of bed. Call light within reach. Patient/family able to voice and demonstrate understanding of unit orientation instructions.  Will continue to evaluate and treat per  MD orders.   Leane PlattSpencer Teriyah Purington RN, BS, BSN

## 2015-01-06 NOTE — Progress Notes (Signed)
VASCULAR LAB PRELIMINARY  PRELIMINARY  PRELIMINARY  PRELIMINARY  Right upper extremity venous Doppler completed.    Preliminary report:  There is no obvious evidence of DVT or SVT noted in the right upper extremity.  We cannot rule out abscess with vascular ultrasound.  Fernando Stoiber, RVT 01/06/2015, 1:41 PM

## 2015-01-06 NOTE — ED Notes (Signed)
Spencer on 5W updated on delay

## 2015-01-06 NOTE — ED Notes (Signed)
Meal tray ordered 

## 2015-01-06 NOTE — ED Notes (Signed)
Dr.Klima aware of temperature 102.9; no orders at this time

## 2015-01-06 NOTE — ED Provider Notes (Signed)
CSN: [id number]     Arrival date & time 01/06/15  0707 History   First MD Initiated Contact with Patient 01/06/15 [phone]     Chief Complaint  Patient presents with  . Extremity Pain     (Consider location/radiation/quality/duration/timing/severity/associated sxs/prior Treatment) Patient is a 29 y.o. female presenting with extremity pain.  Extremity Pain   Patient presents to the emergency department with swelling and redness to the right arm at the elbow joint.  Patient states that this started 2 days ago.  She is an IV drug user.  The patient denies chest pain, shortness of breath, headache, blurred vision, weakness, dizziness, back pain, neck pain, abdominal pain, nausea, vomiting, diarrhea, rash, or syncope.  The patient states she did not take any medications prior to arrival.  She states movement and palpation makes the pain worse Past Medical History  Diagnosis Date  . Hypertension   . Heart murmur   . Seizures    Past Surgical History  Procedure Laterality Date  . No past surgeries     Family History  Problem Relation Age of Onset  . Cancer Mother   . Diabetes Mother   . Hypertension Father    History  Substance Use Topics  . Smoking status: Current Every Day Smoker -- 0.50 packs/day    Types: Cigarettes    Last Attempt to Quit: 07/08/2011  . Smokeless tobacco: Not on file  . Alcohol Use: Yes   OB History    Gravida Para Term Preterm AB TAB SAB Ectopic Multiple Living   6 4 4  2  2   4      Review of Systems   All other systems negative except as documented in the HPI. All pertinent positives and negatives as reviewed in the HPI. Allergies  Amoxicillin and Penicillins cross reactors  Home Medications   Prior to Admission medications   Medication Sig Start Date End Date Taking? Authorizing Provider  citalopram (CELEXA) 40 MG tablet Take 1 tablet (40 mg total) by mouth daily. 11/27/14   Benny LennertJoseph L Zammit, MD  citalopram (CELEXA) 40 MG tablet Take 1 tablet (40 mg  total) by mouth daily. 11/27/14   Benny LennertJoseph L Zammit, MD  gabapentin (NEURONTIN) 300 MG capsule Take 1 capsule (300 mg total) by mouth 3 (three) times daily. 11/27/14   Benny LennertJoseph L Zammit, MD  gabapentin (NEURONTIN) 300 MG capsule 2 pills three times a day 11/27/14   Benny LennertJoseph L Zammit, MD  gabapentin (NEURONTIN) 600 MG tablet Take 600 mg by mouth 3 (three) times daily.    Historical Provider, MD   BP 121/70 mmHg  Pulse 111  Temp(Src) 102.1 F (38.9 C) (Oral)  Resp 24  Ht 5' (1.524 m)  Wt 125 lb (56.7 kg)  BMI 24.41 kg/m2  SpO2 97%  LMP 12/30/2014 Physical Exam  Constitutional: She is oriented to person, place, and time. She appears well-developed and well-nourished. No distress.  HENT:  Head: Normocephalic and atraumatic.  Mouth/Throat: Oropharynx is clear and moist.  Eyes: Pupils are equal, round, and reactive to light.  Neck: Normal range of motion. Neck supple.  Cardiovascular: Normal rate and regular rhythm.  Exam reveals no gallop and no friction rub.   Murmur heard.  Systolic murmur is present with a grade of 2/6  Pulmonary/Chest: Effort normal and breath sounds normal.  Musculoskeletal: She exhibits no edema.       Arms: Neurological: She is alert and oriented to person, place, and time. She exhibits normal muscle tone. Coordination  normal.  Skin: Skin is warm and dry. No rash noted. There is erythema. No pallor.  Psychiatric: She has a normal mood and affect. Her behavior is normal.  Nursing note and vitals reviewed.   ED Course  Procedures (including critical care time) Labs Review Labs Reviewed  URINE CULTURE  CULTURE, BLOOD (ROUTINE X 2)  CULTURE, BLOOD (ROUTINE X 2)  BASIC METABOLIC PANEL  CBC WITH DIFFERENTIAL/PLATELET  URINALYSIS, ROUTINE W REFLEX MICROSCOPIC    Imaging Review No results found.   EKG Interpretation   Date/Time:  Monday January 06 2015 08:02:18 EST Ventricular Rate:  111 PR Interval:  115 QRS Duration: 88 QT Interval:  322 QTC Calculation:  437 R Axis:   84 Text Interpretation:  Sinus tachycardia LAE, consider biatrial enlargement  RSR' in V1 or V2, probably normal variant Minimal ST depression, inferior  leads Artifact in lead(s) I III aVL tachycardia new since previous  Confirmed by YAO  MD, DAVID ([id number]) on 01/06/2015 8:04:03 AM      MDM   Final diagnoses:  None   The patient will need admission to the hospital for cellulitis due to IV drug use.  She is given vancomycin and cefepime and 2 sets of blood cultures were drawn.  Patient has a known murmur which is of concern, she will need further evaluation for possible endocarditis    0  Carlyle Dolly, PA-C 01/06/15 0851  Richardean Canal, MD 01/06/15 6045  Richardean Canal, MD 01/06/15 1840  Richardean Canal, MD 01/06/15 1843  Richardean Canal, MD 01/06/15 [phone]

## 2015-01-06 NOTE — ED Notes (Addendum)
Echo at bedside

## 2015-01-06 NOTE — Progress Notes (Addendum)
ANTIBIOTIC CONSULT NOTE - INITIAL  Pharmacy Consult for vanc and cefepime Indication: cellulitis  Allergies  Allergen Reactions  . Amoxicillin Other (See Comments)    diarrhea  . Penicillins Cross Reactors Hives, Nausea And Vomiting and Swelling    Patient Measurements: Height: 5' (152.4 cm) Weight: 125 lb (56.7 kg) IBW/kg (Calculated) : 45.5   Vital Signs: Temp: 102.1 F (38.9 C) (02/22 0719) Temp Source: Oral (02/22 0719) BP: 121/70 mmHg (02/22 0719) Pulse Rate: 111 (02/22 0719) Intake/Output from previous day:   Intake/Output from this shift:    Labs: No results for input(s): WBC, HGB, PLT, LABCREA, CREATININE in the last 72 hours. CrCl cannot be calculated (Patient has no serum creatinine result on file.). No results for input(s): VANCOTROUGH, VANCOPEAK, VANCORANDOM, GENTTROUGH, GENTPEAK, GENTRANDOM, TOBRATROUGH, TOBRAPEAK, TOBRARND, AMIKACINPEAK, AMIKACINTROU, AMIKACIN in the last 72 hours.   Microbiology: No results found for this or any previous visit (from the past 720 hour(s)).  Medical History: Past Medical History  Diagnosis Date  . Hypertension   . Heart murmur   . Seizures     Medications:  See med history  Assessment: 29 yo lady to start broad spectrum antibiotics for cellulitis.  Pt reports IV drug use to arm.  BMET is pending  Goal of Therapy:  Vancomycin trough level 10-15 mcg/ml  Plan:  Cefepime 2 gm IV X 1 now. Vancomycin 1gm IV X 1 now. Will f/u BMET results  Seay, Lora Poteet 01/06/2015,8:01 AM    ADDENDUM BMET resulted with SCr of 0.92, giving a CrCl of ~4370mL/min. Noted patient with history of murmur and will need workup for endocarditis.   Blood and urine cultures sent. WBC elevated at 11.4, tmax 102.1  Plan: -cefepime 2g IV q12h -after 1g of vancomycin given, start vancomycin 1500mg  IV q24h -will follow c/s, renal function, and any changes to clinical picture that would affect vanc trough goal (ie bacteremia, abscess,  endocarditis, etc)  Talissa Apple D. Erroll Wilbourne, PharmD, BCPS Clinical Pharmacist Pager: [phone] 01/06/2015 9:47 AM

## 2015-01-06 NOTE — Progress Notes (Signed)
  Echocardiogram 2D Echocardiogram has been performed.  Arvil ChacoFoster, Alantra Popoca 01/06/2015, 5:03 PM

## 2015-01-06 NOTE — ED Notes (Signed)
Admitting MD paged concerning fever

## 2015-01-07 ENCOUNTER — Encounter (HOSPITAL_COMMUNITY): Payer: Self-pay | Admitting: Anesthesiology

## 2015-01-07 ENCOUNTER — Inpatient Hospital Stay (HOSPITAL_COMMUNITY): Payer: Self-pay | Admitting: Anesthesiology

## 2015-01-07 ENCOUNTER — Inpatient Hospital Stay (HOSPITAL_COMMUNITY): Payer: Medicaid Other | Admitting: Anesthesiology

## 2015-01-07 ENCOUNTER — Encounter (HOSPITAL_COMMUNITY)
Admission: EM | Disposition: A | Discharge status: Skilled Nursing Facility | Payer: Self-pay | Source: Home / Self Care | Attending: Internal Medicine

## 2015-01-07 DIAGNOSIS — R7881 Bacteremia: Secondary | ICD-10-CM | POA: Diagnosis present

## 2015-01-07 DIAGNOSIS — B182 Chronic viral hepatitis C: Secondary | ICD-10-CM | POA: Diagnosis present

## 2015-01-07 DIAGNOSIS — L03113 Cellulitis of right upper limb: Secondary | ICD-10-CM

## 2015-01-07 DIAGNOSIS — R6510 Systemic inflammatory response syndrome (SIRS) of non-infectious origin without acute organ dysfunction: Secondary | ICD-10-CM

## 2015-01-07 DIAGNOSIS — L02413 Cutaneous abscess of right upper limb: Secondary | ICD-10-CM | POA: Diagnosis present

## 2015-01-07 DIAGNOSIS — F1110 Opioid abuse, uncomplicated: Secondary | ICD-10-CM | POA: Diagnosis present

## 2015-01-07 HISTORY — PX: I & D EXTREMITY: SHX5045

## 2015-01-07 LAB — PHOSPHORUS: Phosphorus: 1.1 mg/dL — ABNORMAL LOW (ref 2.3–4.6)

## 2015-01-07 LAB — COMPREHENSIVE METABOLIC PANEL
ALT: 42 U/L — ABNORMAL HIGH (ref 0–35)
ANION GAP: 5 (ref 5–15)
AST: 47 U/L — AB (ref 0–37)
Albumin: 2.6 g/dL — ABNORMAL LOW (ref 3.5–5.2)
Alkaline Phosphatase: 58 U/L (ref 39–117)
BUN: 5 mg/dL — ABNORMAL LOW (ref 6–23)
CALCIUM: 8 mg/dL — AB (ref 8.4–10.5)
CHLORIDE: 110 mmol/L (ref 96–112)
CO2: 21 mmol/L (ref 19–32)
CREATININE: 0.6 mg/dL (ref 0.50–1.10)
GFR calc non Af Amer: >90 mL/min (ref >90)
Glucose, Bld: 130 mg/dL — ABNORMAL HIGH (ref 70–99)
Potassium: 3.8 mmol/L (ref 3.5–5.1)
Sodium: 136 mmol/L (ref 135–145)
TOTAL PROTEIN: 5.4 g/dL — AB (ref 6.0–8.3)
Total Bilirubin: 0.7 mg/dL (ref 0.3–1.2)

## 2015-01-07 LAB — HEPATITIS PANEL, ACUTE
HCV Ab: REACTIVE — AB
HEP A IGM: NONREACTIVE
Hep B C IgM: NONREACTIVE
Hepatitis B Surface Ag: NEGATIVE

## 2015-01-07 LAB — HIV ANTIBODY (ROUTINE TESTING W REFLEX): HIV Screen 4th Generation wRfx: NONREACTIVE

## 2015-01-07 LAB — GRAM STAIN

## 2015-01-07 LAB — RAPID URINE DRUG SCREEN, HOSP PERFORMED
Amphetamines: NOT DETECTED
BENZODIAZEPINES: POSITIVE — AB
Barbiturates: NOT DETECTED
Cocaine: NOT DETECTED
Opiates: POSITIVE — AB
Tetrahydrocannabinol: NOT DETECTED

## 2015-01-07 LAB — CBC
HCT: 32.4 % — ABNORMAL LOW (ref 36.0–46.0)
Hemoglobin: 10.9 g/dL — ABNORMAL LOW (ref 12.0–15.0)
MCH: 28.5 pg (ref 26.0–34.0)
MCHC: 33.6 g/dL (ref 30.0–36.0)
MCV: 84.6 fL (ref 78.0–100.0)
PLATELETS: 140 10*3/uL — AB (ref 150–400)
RBC: 3.83 MIL/uL — AB (ref 3.87–5.11)
RDW: 15.1 % (ref 11.5–15.5)
WBC: 6.2 10*3/uL (ref 4.0–10.5)

## 2015-01-07 LAB — URINE CULTURE
Colony Count: NO GROWTH
Culture: NO GROWTH

## 2015-01-07 LAB — MAGNESIUM: Magnesium: 1.6 mg/dL (ref 1.5–2.5)

## 2015-01-07 SURGERY — IRRIGATION AND DEBRIDEMENT EXTREMITY
Anesthesia: General | Laterality: Right

## 2015-01-07 MED ORDER — ALPRAZOLAM 0.5 MG PO TABS
0.5000 mg | ORAL_TABLET | Freq: Four times a day (QID) | ORAL | Status: DC | PRN
Start: 2015-01-07 — End: 2015-01-08
  Administered 2015-01-07 – 2015-01-08 (×2): 0.5 mg via ORAL
  Filled 2015-01-07 (×2): qty 1

## 2015-01-07 MED ORDER — MIDAZOLAM HCL 5 MG/5ML IJ SOLN
INTRAMUSCULAR | Status: DC | PRN
  Administered 2015-01-07 (×2): 1 mg via INTRAVENOUS

## 2015-01-07 MED ORDER — MAGNESIUM SULFATE 2 GM/50ML IV SOLN
2.0000 g | Freq: Once | INTRAVENOUS | Status: AC
  Administered 2015-01-07: 2 g via INTRAVENOUS
  Filled 2015-01-07: qty 50

## 2015-01-07 MED ORDER — DEXAMETHASONE SODIUM PHOSPHATE 4 MG/ML IJ SOLN
INTRAMUSCULAR | Status: DC | PRN
  Administered 2015-01-07: 4 mg via INTRAVENOUS

## 2015-01-07 MED ORDER — SODIUM PHOSPHATE 3 MMOLE/ML IV SOLN
20.0000 mmol | Freq: Once | INTRAVENOUS | Status: AC
  Administered 2015-01-07: 20 mmol via INTRAVENOUS
  Filled 2015-01-07: qty 6.67

## 2015-01-07 MED ORDER — ONDANSETRON HCL 4 MG/2ML IJ SOLN
INTRAMUSCULAR | Status: DC | PRN
  Administered 2015-01-07: 4 mg via INTRAVENOUS

## 2015-01-07 MED ORDER — FENTANYL CITRATE 0.05 MG/ML IJ SOLN
INTRAMUSCULAR | Status: DC | PRN
  Administered 2015-01-07 (×4): 50 ug via INTRAVENOUS
  Administered 2015-01-07: 100 ug via INTRAVENOUS
  Administered 2015-01-07 (×4): 50 ug via INTRAVENOUS

## 2015-01-07 MED ORDER — LACTATED RINGERS IV SOLN
INTRAVENOUS | Status: DC | PRN
  Administered 2015-01-07: 17:00:00 via INTRAVENOUS

## 2015-01-07 MED ORDER — ONDANSETRON HCL 4 MG/2ML IJ SOLN
INTRAMUSCULAR | Status: AC
  Filled 2015-01-07: qty 4

## 2015-01-07 MED ORDER — KETAMINE HCL 10 MG/ML IJ SOLN
INTRAMUSCULAR | Status: DC | PRN
  Administered 2015-01-07: 20 mg via INTRAVENOUS

## 2015-01-07 MED ORDER — ACETAMINOPHEN 10 MG/ML IV SOLN
INTRAVENOUS | Status: DC | PRN
  Administered 2015-01-07: 1000 mg via INTRAVENOUS

## 2015-01-07 MED ORDER — HYDROMORPHONE HCL 1 MG/ML IJ SOLN
INTRAMUSCULAR | Status: AC
  Filled 2015-01-07: qty 2

## 2015-01-07 MED ORDER — SENNA 8.6 MG PO TABS
1.0000 | ORAL_TABLET | Freq: Two times a day (BID) | ORAL | Status: DC
  Administered 2015-01-07 – 2015-01-16 (×17): 8.6 mg via ORAL
  Filled 2015-01-07 (×18): qty 1

## 2015-01-07 MED ORDER — PROMETHAZINE HCL 25 MG/ML IJ SOLN
6.2500 mg | INTRAMUSCULAR | Status: DC | PRN
Start: 2015-01-07 — End: 2015-01-07

## 2015-01-07 MED ORDER — 0.9 % SODIUM CHLORIDE (POUR BTL) OPTIME
TOPICAL | Status: DC | PRN
  Administered 2015-01-07: 1000 mL

## 2015-01-07 MED ORDER — MEPERIDINE HCL 25 MG/ML IJ SOLN: 6.2500 mg | INTRAMUSCULAR | Status: DC | PRN

## 2015-01-07 MED ORDER — SUCCINYLCHOLINE CHLORIDE 20 MG/ML IJ SOLN
INTRAMUSCULAR | Status: DC | PRN
  Administered 2015-01-07: 100 mg via INTRAVENOUS

## 2015-01-07 MED ORDER — FENTANYL CITRATE 0.05 MG/ML IJ SOLN
INTRAMUSCULAR | Status: AC
  Filled 2015-01-07: qty 5

## 2015-01-07 MED ORDER — OXYCODONE HCL 5 MG PO TABS
5.0000 mg | ORAL_TABLET | ORAL | Status: DC | PRN
  Administered 2015-01-07 – 2015-01-15 (×48): 10 mg via ORAL
  Filled 2015-01-07 (×49): qty 2

## 2015-01-07 MED ORDER — CEFTRIAXONE SODIUM IN DEXTROSE 40 MG/ML IV SOLN
2.0000 g | INTRAVENOUS | Status: DC
  Administered 2015-01-07 – 2015-01-08 (×2): 2 g via INTRAVENOUS
  Filled 2015-01-07 (×2): qty 50

## 2015-01-07 MED ORDER — PROPOFOL 10 MG/ML IV BOLUS
INTRAVENOUS | Status: DC | PRN
  Administered 2015-01-07: 200 mg via INTRAVENOUS

## 2015-01-07 MED ORDER — KETAMINE HCL 100 MG/ML IJ SOLN
INTRAMUSCULAR | Status: AC
  Filled 2015-01-07: qty 1

## 2015-01-07 MED ORDER — METHOCARBAMOL 1000 MG/10ML IJ SOLN
500.0000 mg | Freq: Four times a day (QID) | INTRAVENOUS | Status: DC | PRN
  Filled 2015-01-07: qty 5

## 2015-01-07 MED ORDER — SODIUM CHLORIDE 0.9 % IR SOLN
Status: DC | PRN
  Administered 2015-01-07 (×2): 3000 mL

## 2015-01-07 MED ORDER — GABAPENTIN 300 MG PO CAPS
300.0000 mg | ORAL_CAPSULE | Freq: Three times a day (TID) | ORAL | Status: DC
  Administered 2015-01-07 – 2015-01-12 (×14): 300 mg via ORAL
  Filled 2015-01-07 (×18): qty 1

## 2015-01-07 MED ORDER — ACETAMINOPHEN 10 MG/ML IV SOLN
INTRAVENOUS | Status: AC
  Filled 2015-01-07: qty 100

## 2015-01-07 MED ORDER — METHOCARBAMOL 500 MG PO TABS
500.0000 mg | ORAL_TABLET | Freq: Four times a day (QID) | ORAL | Status: DC | PRN
  Administered 2015-01-07 – 2015-01-09 (×4): 500 mg via ORAL
  Filled 2015-01-07 (×6): qty 1

## 2015-01-07 MED ORDER — MIDAZOLAM HCL 2 MG/2ML IJ SOLN
INTRAMUSCULAR | Status: AC
  Filled 2015-01-07: qty 2

## 2015-01-07 MED ORDER — MIDAZOLAM HCL 2 MG/2ML IJ SOLN
0.5000 mg | Freq: Once | INTRAMUSCULAR | Status: AC | PRN
  Administered 2015-01-07: 2 mg via INTRAVENOUS

## 2015-01-07 MED ORDER — LIDOCAINE HCL (CARDIAC) 20 MG/ML IV SOLN
INTRAVENOUS | Status: DC | PRN
  Administered 2015-01-07: 30 mg via INTRAVENOUS

## 2015-01-07 MED ORDER — VANCOMYCIN HCL IN DEXTROSE 750-5 MG/150ML-% IV SOLN
750.0000 mg | Freq: Two times a day (BID) | INTRAVENOUS | Status: DC
  Administered 2015-01-07 – 2015-01-08 (×2): 750 mg via INTRAVENOUS
  Filled 2015-01-07 (×3): qty 150

## 2015-01-07 MED ORDER — OXYCODONE-ACETAMINOPHEN 5-325 MG PO TABS
1.0000 | ORAL_TABLET | ORAL | Status: DC | PRN
  Administered 2015-01-07 (×2): 2 via ORAL
  Filled 2015-01-07 (×2): qty 2

## 2015-01-07 MED ORDER — HYDROMORPHONE HCL 1 MG/ML IJ SOLN
0.2500 mg | INTRAMUSCULAR | Status: DC | PRN
  Administered 2015-01-07 (×4): 0.5 mg via INTRAVENOUS

## 2015-01-07 MED ORDER — METHOCARBAMOL 500 MG PO TABS
ORAL_TABLET | ORAL | Status: AC
  Administered 2015-01-07: 500 mg
  Filled 2015-01-07: qty 1

## 2015-01-07 MED ORDER — DEXAMETHASONE SODIUM PHOSPHATE 4 MG/ML IJ SOLN
INTRAMUSCULAR | Status: AC
  Filled 2015-01-07: qty 1

## 2015-01-07 SURGICAL SUPPLY — 53 items
BANDAGE ELASTIC 3 VELCRO ST LF (GAUZE/BANDAGES/DRESSINGS) ×2 IMPLANT
BANDAGE ELASTIC 4 VELCRO ST LF (GAUZE/BANDAGES/DRESSINGS) ×3 IMPLANT
BNDG CONFORM 2 STRL LF (GAUZE/BANDAGES/DRESSINGS) IMPLANT
BNDG GAUZE ELAST 4 BULKY (GAUZE/BANDAGES/DRESSINGS) ×9 IMPLANT
CORDS BIPOLAR (ELECTRODE) ×3 IMPLANT
CUFF TOURNIQUET SINGLE 18IN (TOURNIQUET CUFF) ×3 IMPLANT
CUFF TOURNIQUET SINGLE 24IN (TOURNIQUET CUFF) IMPLANT
DRAIN PENROSE 1/4X12 LTX STRL (WOUND CARE) ×4 IMPLANT
DRSG ADAPTIC 3X8 NADH LF (GAUZE/BANDAGES/DRESSINGS) ×3 IMPLANT
ELECT REM PT RETURN 9FT ADLT (ELECTROSURGICAL)
ELECTRODE REM PT RTRN 9FT ADLT (ELECTROSURGICAL) IMPLANT
GAUZE SPONGE 4X4 12PLY STRL (GAUZE/BANDAGES/DRESSINGS) ×3 IMPLANT
GAUZE XEROFORM 1X8 LF (GAUZE/BANDAGES/DRESSINGS) ×3 IMPLANT
GAUZE XEROFORM 5X9 LF (GAUZE/BANDAGES/DRESSINGS) ×2 IMPLANT
GLOVE BIO SURGEON STRL SZ 6 (GLOVE) ×4 IMPLANT
GLOVE BIO SURGEON STRL SZ8 (GLOVE) ×2 IMPLANT
GLOVE BIOGEL M STRL SZ7.5 (GLOVE) ×3 IMPLANT
GLOVE BIOGEL PI IND STRL 6.5 (GLOVE) IMPLANT
GLOVE BIOGEL PI INDICATOR 6.5 (GLOVE) ×2
GLOVE SS BIOGEL STRL SZ 8 (GLOVE) ×1 IMPLANT
GLOVE SUPERSENSE BIOGEL SZ 8 (GLOVE) ×2
GOWN STRL REUS W/ TWL LRG LVL3 (GOWN DISPOSABLE) ×3 IMPLANT
GOWN STRL REUS W/ TWL XL LVL3 (GOWN DISPOSABLE) ×3 IMPLANT
GOWN STRL REUS W/TWL LRG LVL3 (GOWN DISPOSABLE) ×9
GOWN STRL REUS W/TWL XL LVL3 (GOWN DISPOSABLE) ×9
HANDPIECE INTERPULSE COAX TIP (DISPOSABLE)
KIT BASIN OR (CUSTOM PROCEDURE TRAY) ×3 IMPLANT
KIT ROOM TURNOVER OR (KITS) ×3 IMPLANT
MANIFOLD NEPTUNE II (INSTRUMENTS) ×3 IMPLANT
NDL HYPO 25GX1X1/2 BEV (NEEDLE) IMPLANT
NEEDLE HYPO 25GX1X1/2 BEV (NEEDLE) IMPLANT
NS IRRIG 1000ML POUR BTL (IV SOLUTION) ×3 IMPLANT
PACK ORTHO EXTREMITY (CUSTOM PROCEDURE TRAY) ×3 IMPLANT
PAD ARMBOARD 7.5X6 YLW CONV (MISCELLANEOUS) ×6 IMPLANT
PAD CAST 4YDX4 CTTN HI CHSV (CAST SUPPLIES) ×1 IMPLANT
PADDING CAST COTTON 4X4 STRL (CAST SUPPLIES) ×3
SET CYSTO W/LG BORE CLAMP LF (SET/KITS/TRAYS/PACK) ×2 IMPLANT
SET HNDPC FAN SPRY TIP SCT (DISPOSABLE) IMPLANT
SPLINT FIBERGLASS 3X35 (CAST SUPPLIES) ×2 IMPLANT
SPONGE LAP 18X18 X RAY DECT (DISPOSABLE) ×3 IMPLANT
SPONGE LAP 4X18 X RAY DECT (DISPOSABLE) ×3 IMPLANT
SUT PROLENE 3 0 PS 2 (SUTURE) ×2 IMPLANT
SUT VICRYL 2 0 18  TIES (SUTURE) ×2
SUT VICRYL 2 0 18 TIES (SUTURE) IMPLANT
SUT VICRYL AB 3 0 TIES (SUTURE) ×2 IMPLANT
SYR CONTROL 10ML LL (SYRINGE) IMPLANT
TOWEL OR 17X24 6PK STRL BLUE (TOWEL DISPOSABLE) ×3 IMPLANT
TOWEL OR 17X26 10 PK STRL BLUE (TOWEL DISPOSABLE) ×3 IMPLANT
TUBE ANAEROBIC SPECIMEN COL (MISCELLANEOUS) IMPLANT
TUBE CONNECTING 12'X1/4 (SUCTIONS) ×1
TUBE CONNECTING 12X1/4 (SUCTIONS) ×2 IMPLANT
WATER STERILE IRR 1000ML POUR (IV SOLUTION) ×3 IMPLANT
YANKAUER SUCT BULB TIP NO VENT (SUCTIONS) ×3 IMPLANT

## 2015-01-07 NOTE — Plan of Care (Signed)
Problem: Phase I Progression Outcomes Goal: OOB as tolerated unless otherwise ordered Outcome: Completed/Met Date Met:  01/07/15 Up with assist to bathroom.

## 2015-01-07 NOTE — Consult Note (Signed)
Reason for Consult: Abscess right arm with ascending cellulitis Referring Physician: Hospitalist  [name] is an 29 y.o. female.  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  She was admitted. Her old sound shows a abscess and ran a cubital fossa. She notes no locking or catching. She notes no history of dystrophy. The patient and I reviewed all issues at length. She denies neck back chest abdominal pain.  She states she uses heroin daily but usually does not inject it.  I reviewed all issues at length at present time she has a obvious abscess with ascending cellulitis  Past Medical History  Diagnosis Date  . Hypertension   . Heart murmur   . Seizures   . Cocaine abuse 01/06/2015    Past Surgical History  Procedure Laterality Date  . No past surgeries      Family History  Problem Relation Age of Onset  . Cancer Mother   . Diabetes Mother   . Hypertension Father     Social History:  reports that she has been smoking Cigarettes.  She has been smoking about 0.50 packs per day. She does not have any smokeless tobacco history on file. She reports that she uses illicit drugs (Cocaine, Marijuana, IV, and Heroin). She reports that she does not drink alcohol.  Allergies:  Allergies  Allergen Reactions  . Amoxicillin Other (See Comments)    diarrhea  . Penicillins Cross Reactors Hives, Nausea And Vomiting and Swelling    Medications: I have reviewed the patient's current medications.  Results for orders placed or performed during the hospital encounter of 01/06/15 (from the past 48 hour(s))  Basic metabolic panel     Status: Abnormal   Collection Time: 01/06/15  7:32 AM  Result Value Ref Range   Sodium 134 (L) 135 - 145 mmol/L   Potassium 3.2 (L) 3.5 - 5.1 mmol/L   Chloride 105 96 - 112 mmol/L   CO2 21 19 - 32 mmol/L   Glucose, Bld 148 (H) 70 - 99 mg/dL   BUN 9 6 - 23 mg/dL   Creatinine, Ser 0.92 0.50 - 1.10 mg/dL   Calcium 8.4 8.4 - 10.5 mg/dL    GFR calc non Af Amer 83 (L) >90 mL/min   GFR calc Af Amer >90 >90 mL/min    Comment: (NOTE) The eGFR has been calculated using the CKD EPI equation. This calculation has not been validated in all clinical situations. eGFR's persistently <90 mL/min signify possible Chronic Kidney Disease.    Anion gap 8 5 - 15  CBC with Differential     Status: Abnormal   Collection Time: 01/06/15  7:32 AM  Result Value Ref Range   WBC 11.4 (H) 4.0 - 10.5 K/uL   RBC 4.79 3.87 - 5.11 MIL/uL   Hemoglobin 13.7 12.0 - 15.0 g/dL   HCT 40.5 36.0 - 46.0 %   MCV 84.6 78.0 - 100.0 fL   MCH 28.6 26.0 - 34.0 pg   MCHC 33.8 30.0 - 36.0 g/dL   RDW 14.9 11.5 - 15.5 %   Platelets 184 150 - 400 K/uL   Neutrophils Relative % 85 (H) 43 - 77 %   Neutro Abs 9.7 (H) 1.7 - 7.7 K/uL   Lymphocytes Relative 5 (L) 12 - 46 %   Lymphs Abs 0.5 (L) 0.7 - 4.0 K/uL   Monocytes Relative 10 3 - 12 %   Monocytes Absolute 1.1 (H) 0.1 - 1.0 K/uL   Eosinophils Relative  0 0 - 5 %   Eosinophils Absolute 0.0 0.0 - 0.7 K/uL   Basophils Relative 0 0 - 1 %   Basophils Absolute 0.0 0.0 - 0.1 K/uL  Culture, blood (routine x 2)     Status: None (Preliminary result)   Collection Time: 01/06/15  7:32 AM  Result Value Ref Range   Specimen Description BLOOD LEFT HAND    Special Requests BOTTLES DRAWN AEROBIC AND ANAEROBIC 5CC    Culture      [facility] Note: Gram Stain Report Called to,Read Back By and Verified With: Amaryllis Dyke 5:25AM 01/07/15 [place] Performed at [facility]    Report Status PENDING   Culture, blood (routine x 2)     Status: None (Preliminary result)   Collection Time: 01/06/15  8:00 AM  Result Value Ref Range   Specimen Description BLOOD LEFT HAND    Special Requests BOTTLES DRAWN AEROBIC AND ANAEROBIC 5CC    Culture      GRAM POSITIVE COCCI IN CHAINS Note: Gram Stain Report Called to,Read Back By and Verified With: TRIMAINE BUTLER 3:15AM 01/07/15 [place] Performed at [facility]    Report Status  PENDING   Urinalysis, Routine w reflex microscopic     Status: Abnormal   Collection Time: 01/06/15  9:29 AM  Result Value Ref Range   Color, Urine YELLOW YELLOW   APPearance HAZY (A) CLEAR   Specific Gravity, Urine 1.022 1.005 - 1.030   pH 6.0 5.0 - 8.0   Glucose, UA NEGATIVE NEGATIVE mg/dL   Hgb urine dipstick NEGATIVE NEGATIVE   Bilirubin Urine NEGATIVE NEGATIVE   Ketones, ur 15 (A) NEGATIVE mg/dL   Protein, ur 30 (A) NEGATIVE mg/dL   Urobilinogen, UA 1.0 0.0 - 1.0 mg/dL   Nitrite NEGATIVE NEGATIVE   Leukocytes, UA NEGATIVE NEGATIVE  Urine rapid drug screen (hosp performed)     Status: Abnormal   Collection Time: 01/06/15  9:29 AM  Result Value Ref Range   Opiates POSITIVE (A) NONE DETECTED   Cocaine POSITIVE (A) NONE DETECTED   Benzodiazepines NONE DETECTED NONE DETECTED   Amphetamines NONE DETECTED NONE DETECTED   Tetrahydrocannabinol NONE DETECTED NONE DETECTED   Barbiturates NONE DETECTED NONE DETECTED    Comment:        DRUG SCREEN FOR MEDICAL PURPOSES ONLY.  IF CONFIRMATION IS NEEDED FOR ANY PURPOSE, NOTIFY LAB WITHIN 5 DAYS.        LOWEST DETECTABLE LIMITS FOR URINE DRUG SCREEN Drug Class       Cutoff (ng/mL) Amphetamine      1000 Barbiturate      200 Benzodiazepine   878 Tricyclics       676 Opiates          300 Cocaine          300 THC              50   Urine microscopic-add on     Status: Abnormal   Collection Time: 01/06/15  9:29 AM  Result Value Ref Range   Squamous Epithelial / LPF FEW (A) RARE   WBC, UA 3-6 <3 WBC/hpf   Bacteria, UA FEW (A) RARE   Urine-Other MUCOUS PRESENT   HIV antibody     Status: None   Collection Time: 01/06/15  9:35 AM  Result Value Ref Range   HIV Screen 4th Generation wRfx Non Reactive Non Reactive    Comment: (NOTE) Performed At: [facility] [address] [place], [place] [id number] Evette Doffing  Darrick Penna MD EG:[id number]   POC urine preg, ED (not at [facility])     Status: None   Collection Time: 01/06/15  9:35 AM   Result Value Ref Range   Preg Test, Ur NEGATIVE NEGATIVE    Comment:        THE SENSITIVITY OF THIS METHODOLOGY IS >24 mIU/mL   MRSA PCR Screening     Status: None   Collection Time: 01/06/15  6:48 PM  Result Value Ref Range   MRSA by PCR NEGATIVE NEGATIVE    Comment:        The GeneXpert MRSA Assay (FDA approved for NASAL specimens only), is one component of a comprehensive MRSA colonization surveillance program. It is not intended to diagnose MRSA infection nor to guide or monitor treatment for MRSA infections.   Basic metabolic panel     Status: Abnormal   Collection Time: 01/06/15  6:58 PM  Result Value Ref Range   Sodium 137 135 - 145 mmol/L   Potassium 3.0 (L) 3.5 - 5.1 mmol/L   Chloride 112 96 - 112 mmol/L   CO2 20 19 - 32 mmol/L   Glucose, Bld 104 (H) 70 - 99 mg/dL   BUN 7 6 - 23 mg/dL   Creatinine, Ser 0.69 0.50 - 1.10 mg/dL   Calcium 7.6 (L) 8.4 - 10.5 mg/dL   GFR calc non Af Amer >90 >90 mL/min   GFR calc Af Amer >90 >90 mL/min    Comment: (NOTE) The eGFR has been calculated using the CKD EPI equation. This calculation has not been validated in all clinical situations. eGFR's persistently <90 mL/min signify possible Chronic Kidney Disease.    Anion gap 5 5 - 15  CBC     Status: Abnormal   Collection Time: 01/07/15  5:58 AM  Result Value Ref Range   WBC 6.2 4.0 - 10.5 K/uL   RBC 3.83 (L) 3.87 - 5.11 MIL/uL   Hemoglobin 10.9 (L) 12.0 - 15.0 g/dL    Comment: RESULT REPEATED AND VERIFIED   HCT 32.4 (L) 36.0 - 46.0 %   MCV 84.6 78.0 - 100.0 fL   MCH 28.5 26.0 - 34.0 pg   MCHC 33.6 30.0 - 36.0 g/dL   RDW 15.1 11.5 - 15.5 %   Platelets 140 (L) 150 - 400 K/uL    Comment: RESULT REPEATED AND VERIFIED  Comprehensive metabolic panel     Status: Abnormal   Collection Time: 01/07/15  5:58 AM  Result Value Ref Range   Sodium 136 135 - 145 mmol/L   Potassium 3.8 3.5 - 5.1 mmol/L   Chloride 110 96 - 112 mmol/L   CO2 21 19 - 32 mmol/L   Glucose, Bld 130 (H)  70 - 99 mg/dL   BUN 5 (L) 6 - 23 mg/dL   Creatinine, Ser 0.60 0.50 - 1.10 mg/dL   Calcium 8.0 (L) 8.4 - 10.5 mg/dL   Total Protein 5.4 (L) 6.0 - 8.3 g/dL   Albumin 2.6 (L) 3.5 - 5.2 g/dL   AST 47 (H) 0 - 37 U/L   ALT 42 (H) 0 - 35 U/L   Alkaline Phosphatase 58 39 - 117 U/L   Total Bilirubin 0.7 0.3 - 1.2 mg/dL   GFR calc non Af Amer >90 >90 mL/min   GFR calc Af Amer >90 >90 mL/min    Comment: (NOTE) The eGFR has been calculated using the CKD EPI equation. This calculation has not been validated in all clinical situations. eGFR's persistently <  90 mL/min signify possible Chronic Kidney Disease.    Anion gap 5 5 - 15  Magnesium     Status: None   Collection Time: 01/07/15  5:58 AM  Result Value Ref Range   Magnesium 1.6 1.5 - 2.5 mg/dL  Hepatitis panel, acute     Status: Abnormal   Collection Time: 01/07/15  5:58 AM  Result Value Ref Range   Hepatitis B Surface Ag NEGATIVE NEGATIVE   HCV Ab Reactive (A) NEGATIVE   Hep A IgM NON REACTIVE NON REACTIVE    Comment: (NOTE) Effective September 30, 2014, Hepatitis Acute Panel (test code 40905) will be revised to automatically reflex to the Hepatitis C Viral RNA, Quantitative, Real-Time PCR assay if the Hepatitis C antibody screening result is Reactive. This action is being taken to ensure that the CDC/USPSTF recommended HCV diagnostic algorithm with the appropriate test reflex needed for accurate interpretation is followed.    Hep B C IgM NON REACTIVE NON REACTIVE    Comment: (NOTE) High levels of Hepatitis B Core IgM antibody are detectable during the acute stage of Hepatitis B. This antibody is used to differentiate current from past HBV infection. Performed at [facility]   Phosphorus     Status: Abnormal   Collection Time: 01/07/15  5:58 AM  Result Value Ref Range   Phosphorus 1.1 (L) 2.3 - 4.6 mg/dL    Dg Chest 2 View  0/25/6154   CLINICAL DATA:  Right-sided arm pain and swelling. IV drug use on same side   EXAM: CHEST  2 VIEW  COMPARISON:  None.  FINDINGS: Normal mediastinum and cardiac silhouette. Normal pulmonary vasculature. No evidence of effusion, infiltrate, or pneumothorax. No acute bony abnormality.  IMPRESSION: Normal chest radiograph.   Electronically Signed   By: Genevive Bi M.D.   On: 01/06/2015 09:35   [place] Extrem Up Right Ltd  01/06/2015   CLINICAL DATA:  Focal swelling of right antecubital region.  EXAM: ULTRASOUND right UPPER EXTREMITY LIMITED  TECHNIQUE: Ultrasound examination of the upper extremity soft tissues was performed in the area of clinical concern.  COMPARISON:  None.  FINDINGS: 2.9 x 2.8 x 1.6 cm complex fluid collection is seen in soft tissues of right antecubital fossa with surrounding edema concerning for possible abscess.  IMPRESSION: 2.9 cm complex fluid collection seen in soft tissues of right antecubital fossa concerning for possible abscess.   Electronically Signed   By: Lupita Raider, M.D.   On: 01/06/2015 15:11    Review of Systems  Constitutional: Negative.   Eyes: Negative.   Respiratory: Negative.   Genitourinary: Negative.   Skin: Negative.   Neurological: Negative.   Psychiatric/Behavioral: Negative.    Blood pressure 120/78, pulse 82, temperature 97.9 F (36.6 C), temperature source Oral, resp. rate 20, height 5' (1.524 m), weight 55.1 kg (121 lb 7.6 oz), last menstrual period 12/30/2014, SpO2 100 %. Physical Exam right antecubital fossa abscess with ascending cellulitis. Radial median and ulnar nerves are intact pulses intact. There is no compartment syndrome at present time but certainly she has a very severe and worsening infection.  The patient is alert and oriented in no acute distress the patient complains of pain in the affected upper extremity.  The patient is noted to have a normal HEENT exam.  Lung fields show equal chest expansion and no shortness of breath  abdomen exam is nontender without distention.  Lower extremity examination does  not show any fracture dislocation or blood clot symptoms.  Pelvis is stable neck and back are stable and nontender  Assessment/Plan: Antecubital fossa abscess with ascending cellulitis secondary to hair 1 use  Plan for I and D and repair structures as necessary. I discussed her risk and benefits timeframe duration of recovery and other issues as did his germane to her upper extremity predicament I would declare this an emergency and move forth with surgical reconstruction as soon as possible  We are planning surgery for your upper extremity. The risk and benefits of surgery include risk of bleeding infection anesthesia damage to normal structures and failure of the surgery to accomplish its intended goals of relieving symptoms and restoring function with this in mind we'll going to proceed. I have specifically discussed with the patient the pre-and postoperative regime and the does and don'ts and risk and benefits in great detail. Risk and benefits of surgery also include risk of dystrophy chronic nerve pain failure of the healing process to go onto completion and other inherent risks of surgery The relavent the pathophysiology of the disease/injury process, as well as the alternatives for treatment and postoperative course of action has been discussed in great detail with the patient who desires to proceed.  We will do everything in our power to help you (the patient) restore function to the upper extremity. Is a pleasure to see this patient today.    Paulene Floor 01/07/2015, 4:26 PM

## 2015-01-07 NOTE — Transfer of Care (Signed)
Immediate Anesthesia Transfer of Care Note  Patient: [name]  Procedure(s) Performed: Procedure(s): IRRIGATION AND DEBRIDEMENT EXTREMITY (Right)  Patient Location: PACU  Anesthesia Type:General  Level of Consciousness: awake, alert  and oriented  Airway & Oxygen Therapy: Patient Spontanous Breathing and Patient connected to nasal cannula oxygen  Post-op Assessment: Report given to RN and Post -op Vital signs reviewed and stable  Post vital signs: Reviewed and stable  Last Vitals:  Filed Vitals:   01/07/15 1446  BP: 120/78  Pulse: 82  Temp: 36.6 C  Resp: 20    Complications: No apparent anesthesia complications

## 2015-01-07 NOTE — Progress Notes (Addendum)
Patient returned to floor at 8pm from surgery. She was settled into the room and was told that she needed to call for assistance if she needed help getting up due to safety and fall concerns. Due to her history of drug abuse and suspicious behavior in PACU her door was kept open and was put on strict bed alarm which was discussed with charge nurse and nurse tech. She kept getting up to get into her things and when staff returned to check on patient the room smelled of cigarette smoke. Pt said "she flushed cig butts down the toilet, because I'm done with all that".   We moved patient to a camera room at 9:30 pm to make sure nothing was happening while we weren't in the room.  Later at 10:00pm it came to staffs attention when bed alarm went off again, that there were ashes and a lighter in bed with the patient which she was trying to throw away. We went through her things and found multiple used syringes in a bag and two syringes with solutions still in them. Security was called. They searched the room and found no other illegal substances, or other things of the sort. Security took syringes from floor down to their office to report the incident. MD was called. Rapid drug screen was ordered and performed. All pain medications are to be held until further notice except PO 10 mg Oxy IR. Charge nurse was aware and was in the room assisting with pt. Will continue to monitor closely.

## 2015-01-07 NOTE — Anesthesia Postprocedure Evaluation (Signed)
Anesthesia Post Note  Patient: [name]  Procedure(s) Performed: Procedure(s) (LRB): IRRIGATION AND DEBRIDEMENT EXTREMITY (Right)  Anesthesia type: General  Patient location: PACU  Post pain: Pain level controlled and Adequate analgesia  Post assessment: Post-op Vital signs reviewed, Patient's Cardiovascular Status Stable, Respiratory Function Stable, Patent Airway and Pain level controlled  Last Vitals:  Filed Vitals:   01/07/15 1915  BP: 129/89  Pulse: 83  Temp:   Resp: 17    Post vital signs: Reviewed and stable  Level of consciousness: awake, alert  and oriented  Complications: No apparent anesthesia complications

## 2015-01-07 NOTE — Progress Notes (Addendum)
ANTIBIOTIC CONSULT NOTE - INITIAL  Pharmacy Consult for Ceftriaxone (already on vancomycin) Indication: Positive blood cultures  Allergies  Allergen Reactions  . Amoxicillin Other (See Comments)    diarrhea  . Penicillins Cross Reactors Hives, Nausea And Vomiting and Swelling    Patient Measurements: Height: 5' (152.4 cm) Weight: 121 lb 7.6 oz (55.1 kg) IBW/kg (Calculated) : 45.5  Vital Signs: Temp: 100.8 F (38.2 C) (02/23 0508) Temp Source: Oral (02/23 0508) BP: 140/85 mmHg (02/23 0508) Pulse Rate: 105 (02/23 0508)  Labs:  Recent Labs  01/06/15 0732 01/06/15 1858  WBC 11.4*  --   HGB 13.7  --   PLT 184  --   CREATININE 0.92 0.69   Estimated Creatinine Clearance: 80 mL/min (by C-G formula based on Cr of 0.69).   Microbiology: Recent Results (from the past 720 hour(s))  Culture, blood (routine x 2)     Status: None (Preliminary result)   Collection Time: 01/06/15  7:32 AM  Result Value Ref Range Status   Specimen Description BLOOD LEFT HAND  Final   Special Requests BOTTLES DRAWN AEROBIC AND ANAEROBIC 5CC  Final   Culture   Final    [facility]: Gram Stain Report Called to,Read Back By and Verified With: Judee ClaraRIMAINE BUTLER 5:25AM 01/07/15 [place] [facility]formed [facility]    Report Status PENDING  Incomplete  Culture, blood (routine x 2)     Status: None (Preliminary result)   Collection Time: 01/06/15  8:00 AM  Result Value Ref Range Status   Specimen Description BLOOD LEFT HAND  Final   Special Requests BOTTLES DRAWN AEROBIC AND ANAEROBIC 5CC  Final   Culture   Final    GRAM POSITIVE COCCI IN CHAINS Note: Gram Stain Report Called to,Read Back By and Verified With: TRIMAINE BUTLER 3:15AM 01/07/15 [place] Performed at [facility]    Report Status PENDING  Incomplete  MRSA PCR Screening     Status: None   Collection Time: 01/06/15  6:48 PM  Result Value Ref Range Status   MRSA by PCR NEGATIVE NEGATIVE Final    Comment:        The GeneXpert MRSA  Assay (FDA approved for NASAL specimens only), is one component of a comprehensive MRSA colonization surveillance program. It is not intended to diagnose MRSA infection nor to guide or monitor treatment for MRSA infections.     Medical History: Past Medical History  Diagnosis Date  . Hypertension   . Heart murmur   . Seizures   . Cocaine abuse 01/06/2015    Assessment: History of IVDA, already on vancomycin for cellulitis, now with positive blood cultures, adding ceftriaxone until cultures finalize.   Plan:  -Ceftriaxone 2g IV q24h -F/U cultures  Abran DukeLedford, James 01/07/2015,6:04 AM    ===============================   Addendum: - renal function improving - RN hanging vanc 1500mg  bag and instructed him to hang half of bag (= 750mg  dose)   Goal of Therapy: Vanc trough: 15-20 mcg/mL given concern of abscess and endocarditis    Plan: - Change vanc to 750mg  IV Q12H, start tonight - Monitor renal fxn, clinical progress, vanc trough as indicated - F/U Mag and Phos supplementation    Ezabella Teska D. Laney Potashang, PharmD, BCPS Pager:  [phone] 01/07/2015, 10:52 AM

## 2015-01-07 NOTE — Progress Notes (Signed)
Pt is very instant on going back to room although she has been rating her pain at a very high level 7-8 since she has been out with us.  Pt informed that we have different order parameters in which we are allowed to administer pain medications.  Pt knows that if she has severe pain she won't be taken back to the floor right away so suddenly she is a 4.  Will continue to monitor.  5 W called and will accept pt back in 15 minutes.

## 2015-01-07 NOTE — Anesthesia Procedure Notes (Signed)
Procedure Name: Intubation Date/Time: 01/07/2015 5:16 PM Performed by: Leonel Ramsay'LAUGHLIN, Vernica Wachtel H Pre-anesthesia Checklist: Patient identified, Timeout performed, Emergency Drugs available, Suction available and Patient being monitored Patient Re-evaluated:Patient Re-evaluated prior to inductionOxygen Delivery Method: Circle system utilized Preoxygenation: Pre-oxygenation with 100% oxygen Intubation Type: IV induction, Cricoid Pressure applied and Rapid sequence Laryngoscope Size: Mac and 3 Grade View: Grade I Tube type: Oral Tube size: 7.0 mm Number of attempts: 1 Airway Equipment and Method: Stylet Placement Confirmation: ETT inserted through vocal cords under direct vision,  positive ETCO2 and breath sounds checked- equal and bilateral Secured at: 22 cm Tube secured with: Tape Dental Injury: Teeth and Oropharynx as per pre-operative assessment

## 2015-01-07 NOTE — Progress Notes (Signed)
Report given to Nurse in OR. Madelin RearLonnie Aldean Pipe, MSN, RN, [employer]

## 2015-01-07 NOTE — Plan of Care (Signed)
Problem: Phase I Progression Outcomes Goal: Pain controlled with appropriate interventions Outcome: Progressing Up with assist to bathroom.

## 2015-01-07 NOTE — Progress Notes (Signed)
Subjective: Overnight, patient spiked fever at 101.6. Blood cultures growing gram + cocci in chains. Ceftriaxone was added to Vanc. Patient reports she does not feel well this morning. She complains of increased pain in her right arm, generalized achiness, and nausea.   Objective: Vital signs in last 24 hours: Filed Vitals:   01/06/15 1728 01/06/15 2117 01/07/15 0508 01/07/15 0600  BP: 118/71 116/68 140/85   Pulse: 108 98 105   Temp: 100 F (37.8 C) 98.8 F (37.1 C) 100.8 F (38.2 C) 101.6 F (38.7 C)  TempSrc: Oral Oral Oral Oral  Resp: 18 20 18    Height:      Weight:      SpO2: 98% 99% 97%    Weight change:   Intake/Output Summary (Last 24 hours) at 01/07/15 16100937 Last data filed at 01/07/15 0047  Gross per 24 hour  Intake 949.58 ml  Output    700 ml  Net 249.58 ml   Physical Exam General: alert, sitting up in bed, diaphoretic, appears uncomfortable HEENT: [place]/AT, EOMI, mucus membranes moist CV: tachycardic, early systolic murmur heard best at 2nd right intercostal space Pulm: CTA bilaterally, breaths non-labored Abd: BS+, soft, non-tender Ext: R antecubital fossa is erythematous and warm with fluctuance present Neuro: alert and oriented x 3, no focal deficits  Lab Results: Basic Metabolic Panel:  Recent Labs Lab 01/06/15 1858 01/07/15 0558  NA 137 136  K 3.0* 3.8  CL 112 110  CO2 20 21  GLUCOSE 104* 130*  BUN 7 5*  CREATININE 0.69 0.60  CALCIUM 7.6* 8.0*  MG  --  1.6  PHOS  --  1.1*   Liver Function Tests:  Recent Labs Lab 01/07/15 0558  AST 47*  ALT 42*  ALKPHOS 58  BILITOT 0.7  PROT 5.4*  ALBUMIN 2.6*   CBC:  Recent Labs Lab 01/06/15 0732 01/07/15 0558  WBC 11.4* 6.2  NEUTROABS 9.7*  --   HGB 13.7 10.9*  HCT 40.5 32.4*  MCV 84.6 84.6  PLT 184 140*   Urine Drug Screen: Drugs of Abuse     Component Value Date/Time   LABOPIA POSITIVE* 01/06/2015 0929   COCAINSCRNUR POSITIVE* 01/06/2015 0929   LABBENZ NONE DETECTED 01/06/2015  0929   AMPHETMU NONE DETECTED 01/06/2015 0929   THCU NONE DETECTED 01/06/2015 0929   LABBARB NONE DETECTED 01/06/2015 [id number]    Micro Results: Recent Results (from the past 240 hour(s))  Culture, blood (routine x 2)     Status: None (Preliminary result)   Collection Time: 01/06/15  7:32 AM  Result Value Ref Range Status   Specimen Description BLOOD LEFT HAND  Final   Special Requests BOTTLES DRAWN AEROBIC AND ANAEROBIC 5CC  Final   Culture   Final    [facility] Note: Gram Stain Report Called to,Read Back By and Verified With: Judee ClaraRIMAINE BUTLER 5:25AM 01/07/15 THOMI Performed at [facility]    Report Status PENDING  Incomplete  Culture, blood (routine x 2)     Status: None (Preliminary result)   Collection Time: 01/06/15  8:00 AM  Result Value Ref Range Status   Specimen Description BLOOD LEFT HAND  Final   Special Requests BOTTLES DRAWN AEROBIC AND ANAEROBIC 5CC  Final   Culture   Final    GRAM POSITIVE COCCI IN CHAINS Note: Gram Stain Report Called to,Read Back By and Verified With: TRIMAINE BUTLER 3:15AM 01/07/15 THOMI Performed at [facility]    Report Status PENDING  Incomplete  MRSA PCR Screening  Status: None   Collection Time: 01/06/15  6:48 PM  Result Value Ref Range Status   MRSA by PCR NEGATIVE NEGATIVE Final    Comment:        The GeneXpert MRSA Assay (FDA approved for NASAL specimens only), is one component of a comprehensive MRSA colonization surveillance program. It is not intended to diagnose MRSA infection nor to guide or monitor treatment for MRSA infections.    Studies/Results: Dg Chest 2 View  01/06/2015   CLINICAL DATA:  Right-sided arm pain and swelling. IV drug use on same side  EXAM: CHEST  2 VIEW  COMPARISON:  None.  FINDINGS: Normal mediastinum and cardiac silhouette. Normal pulmonary vasculature. No evidence of effusion, infiltrate, or pneumothorax. No acute bony abnormality.  IMPRESSION: Normal chest radiograph.   Electronically  Signed   By: Genevive Bi M.D.   On: 01/06/2015 09:35   [place] Extrem Up Right Ltd  01/06/2015   CLINICAL DATA:  Focal swelling of right antecubital region.  EXAM: ULTRASOUND right UPPER EXTREMITY LIMITED  TECHNIQUE: Ultrasound examination of the upper extremity soft tissues was performed in the area of clinical concern.  COMPARISON:  None.  FINDINGS: 2.9 x 2.8 x 1.6 cm complex fluid collection is seen in soft tissues of right antecubital fossa with surrounding edema concerning for possible abscess.  IMPRESSION: 2.9 cm complex fluid collection seen in soft tissues of right antecubital fossa concerning for possible abscess.   Electronically Signed   By: Lupita Raider, M.D.   On: 01/06/2015 15:11   Medications: I have reviewed the patient's current medications. Scheduled Meds: . cefTRIAXone (ROCEPHIN)  IV  2 g Intravenous Q24H  . heparin  5,000 Units Subcutaneous 3 times per day  . sodium chloride  3 mL Intravenous Q12H  . sodium chloride  3 mL Intravenous Q12H  . vancomycin  1,500 mg Intravenous Q24H   Continuous Infusions:  PRN Meds:.sodium chloride, acetaminophen, morphine injection, ondansetron **OR** ondansetron (ZOFRAN) IV, oxyCODONE-acetaminophen, sodium chloride Assessment/Plan:  Gram Positive Bacteremia with SIRS: Patient febrile overnight with fever up to 102.9. Blood cultures growing gram positive cocci in chains. Patient was already placed on Vancomycin on 2/22 because of her right antecubital fossa abscess. Ceftriaxone IV was added last night once blood cultures resulted. TTE negative for vegetations, but may require TEE to further evaluate for vegetations given her history of IVDU.  - Continue Vanc - Continue Ceftriaxone - Continue to monitor fever curve  - Repeat blood cultures  - Consider TEE to assess for vegetations   Right Abscess/Cellulitis of Antecubital fossa: Ultrasound confirms abscess in right antecubital fossa. Her right extremity appears erythematous and is warm,  fluctuance present. Continue antibiotics. Will get orthopedics evaluate for possible I&D. 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 She will be receing Morphine and Percocet for pain control, so withdraw is unlikely. Pt failed recent rehab attempt, but will consult CSW for assistance with cessation.  Hypokalemia: K 3.2 on admission, now 3.8.  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 She is unsure of the cause, but no seizures in 3 years, per pt. She is not on medication for her seizures. - Seizure precautions  Hypertension: Pt not on medication and BP normal, but in the setting of possible sepsis.  Will continue to monitor.   H/O cardiac murmur: Early systolic murmur heard on exam today, consistent with a flow murmur. Patient states she has had murmur since she was a child. Echo showed EF 65-70%, no wall motion abnormalities, no atrial defect or PFO present.   Diet: Regular  VTE Ppx: Heparin SQ Dispo: Disposition is deferred at this time, awaiting improvement of current medical problems.    The patient does not have a current PCP (Pcp Not In System) and does need an [facility] hospital follow-up appointment after discharge.  The patient does not have transportation limitations that hinder transportation to clinic appointments.  .Services Needed at time of discharge: Y = Yes, Blank = No PT:   OT:   RN:   Equipment:   Other:     LOS: 1 day   Rich Number, MD 01/07/2015, 9:37 AM

## 2015-01-07 NOTE — Anesthesia Preprocedure Evaluation (Addendum)
Anesthesia Evaluation  Patient identified by MRN, date of birth, ID band Patient awake    Reviewed: Allergy & Precautions, NPO status , Patient's Chart, lab work & pertinent test results  History of Anesthesia Complications Negative for: history of anesthetic complications  Airway Mallampati: II  TM Distance: >3 FB Neck ROM: Full    Dental  (+) Edentulous Upper, Poor Dentition, Loose, Missing, Dental Advisory Given   Pulmonary Current Smoker,  breath sounds clear to auscultation        Cardiovascular + Valvular Problems/Murmurs (h/o SBE) Rhythm:Regular Rate:Normal  01/06/15 ECHO: normal LVF, EF 65-70%, valves OK   Neuro/Psych negative neurological ROS     GI/Hepatic negative GI ROS, (+)     substance abuse (IV heroin)  cocaine use and IV drug use,   Endo/Other  negative endocrine ROS  Renal/GU negative Renal ROS     Musculoskeletal   Abdominal   Peds  Hematology  (+) Blood dyscrasia (Hb 10.9), ,   Anesthesia Other Findings   Reproductive/Obstetrics 01/06/15 Preg test: NEG                            Anesthesia Physical Anesthesia Plan  ASA: III and emergent  Anesthesia Plan: General   Post-op Pain Management:    Induction: Intravenous and Rapid sequence  Airway Management Planned: Oral ETT  Additional Equipment:   Intra-op Plan:   Post-operative Plan: Extubation in OR  Informed Consent: I have reviewed the patients History and Physical, chart, labs and discussed the procedure including the risks, benefits and alternatives for the proposed anesthesia with the patient or authorized representative who has indicated his/her understanding and acceptance.   Dental advisory given  Plan Discussed with: CRNA and Surgeon  Anesthesia Plan Comments: (Plan routine monitors, GETA)        Anesthesia Quick Evaluation

## 2015-01-07 NOTE — Op Note (Signed)
See dict #[id number] Amanda PeaGramig MD

## 2015-01-08 ENCOUNTER — Telehealth (HOSPITAL_BASED_OUTPATIENT_CLINIC_OR_DEPARTMENT_OTHER): Payer: Self-pay | Admitting: Emergency Medicine

## 2015-01-08 DIAGNOSIS — B9689 Other specified bacterial agents as the cause of diseases classified elsewhere: Secondary | ICD-10-CM

## 2015-01-08 DIAGNOSIS — I809 Phlebitis and thrombophlebitis of unspecified site: Secondary | ICD-10-CM

## 2015-01-08 DIAGNOSIS — R7881 Bacteremia: Secondary | ICD-10-CM

## 2015-01-08 DIAGNOSIS — F419 Anxiety disorder, unspecified: Secondary | ICD-10-CM

## 2015-01-08 LAB — BASIC METABOLIC PANEL
ANION GAP: 11 (ref 5–15)
BUN: 6 mg/dL (ref 6–23)
CHLORIDE: 108 mmol/L (ref 96–112)
CO2: 20 mmol/L (ref 19–32)
Calcium: 8.5 mg/dL (ref 8.4–10.5)
Creatinine, Ser: 0.65 mg/dL (ref 0.50–1.10)
GFR calc Af Amer: >90 mL/min (ref >90)
GFR calc non Af Amer: >90 mL/min (ref >90)
GLUCOSE: 254 mg/dL — AB (ref 70–99)
Potassium: 3.8 mmol/L (ref 3.5–5.1)
Sodium: 139 mmol/L (ref 135–145)

## 2015-01-08 LAB — CBC
HCT: 34 % — ABNORMAL LOW (ref 36.0–46.0)
Hemoglobin: 11.1 g/dL — ABNORMAL LOW (ref 12.0–15.0)
MCH: 27.7 pg (ref 26.0–34.0)
MCHC: 32.6 g/dL (ref 30.0–36.0)
MCV: 84.8 fL (ref 78.0–100.0)
PLATELETS: 170 10*3/uL (ref 150–400)
RBC: 4.01 MIL/uL (ref 3.87–5.11)
RDW: 14.9 % (ref 11.5–15.5)
WBC: 6.3 10*3/uL (ref 4.0–10.5)

## 2015-01-08 LAB — GLUCOSE, CAPILLARY
GLUCOSE-CAPILLARY: 230 mg/dL — AB (ref 70–99)
Glucose-Capillary: 144 mg/dL — ABNORMAL HIGH (ref 70–99)
Glucose-Capillary: 128 mg/dL — ABNORMAL HIGH (ref 70–99)

## 2015-01-08 LAB — CULTURE, BLOOD (ROUTINE X 2)

## 2015-01-08 LAB — HEMOGLOBIN A1C
HEMOGLOBIN A1C: 5.5 % (ref 4.8–5.6)
MEAN PLASMA GLUCOSE: 111 mg/dL

## 2015-01-08 LAB — HCV RNA QUANT
HCV Quantitative Log: 5.82 {Log} — ABNORMAL HIGH (ref <1.18)
HCV Quantitative: 654020 IU/mL — ABNORMAL HIGH (ref <15)

## 2015-01-08 LAB — MAGNESIUM: Magnesium: 2 mg/dL (ref 1.5–2.5)

## 2015-01-08 LAB — PHOSPHORUS: Phosphorus: 2.4 mg/dL (ref 2.3–4.6)

## 2015-01-08 MED ORDER — INSULIN ASPART 100 UNIT/ML ~~LOC~~ SOLN
0.0000 [IU] | Freq: Three times a day (TID) | SUBCUTANEOUS | Status: DC
  Administered 2015-01-08: 2 [IU] via SUBCUTANEOUS
  Administered 2015-01-08: 5 [IU] via SUBCUTANEOUS
  Administered 2015-01-09 (×2): 3 [IU] via SUBCUTANEOUS
  Administered 2015-01-10 – 2015-01-13 (×6): 2 [IU] via SUBCUTANEOUS
  Administered 2015-01-14: 3 [IU] via SUBCUTANEOUS
  Administered 2015-01-14: 2 [IU] via SUBCUTANEOUS
  Administered 2015-01-15: 3 [IU] via SUBCUTANEOUS
  Administered 2015-01-15: 2 [IU] via SUBCUTANEOUS

## 2015-01-08 MED ORDER — HYDROMORPHONE HCL 1 MG/ML IJ SOLN
1.0000 mg | INTRAMUSCULAR | Status: DC | PRN
  Administered 2015-01-08 – 2015-01-15 (×41): 1 mg via INTRAVENOUS
  Filled 2015-01-08 (×41): qty 1

## 2015-01-08 MED ORDER — DIAZEPAM 2 MG PO TABS
1.0000 mg | ORAL_TABLET | Freq: Two times a day (BID) | ORAL | Status: DC | PRN
Start: 2015-01-08 — End: 2015-01-12
  Administered 2015-01-08 – 2015-01-12 (×8): 1 mg via ORAL
  Filled 2015-01-08 (×9): qty 1

## 2015-01-08 MED ORDER — CITALOPRAM HYDROBROMIDE 40 MG PO TABS
40.0000 mg | ORAL_TABLET | Freq: Every day | ORAL | Status: DC
  Administered 2015-01-08 – 2015-01-16 (×9): 40 mg via ORAL
  Filled 2015-01-08 (×6): qty 1
  Filled 2015-01-08: qty 2
  Filled 2015-01-08 (×3): qty 1

## 2015-01-08 MED ORDER — CEFAZOLIN SODIUM 1-5 GM-% IV SOLN
1.0000 g | Freq: Three times a day (TID) | INTRAVENOUS | Status: DC
  Administered 2015-01-08 – 2015-01-16 (×23): 1 g via INTRAVENOUS
  Filled 2015-01-08 (×28): qty 50

## 2015-01-08 MED ORDER — ZOLPIDEM TARTRATE 5 MG PO TABS
5.0000 mg | ORAL_TABLET | Freq: Every evening | ORAL | Status: DC | PRN
  Administered 2015-01-08 – 2015-01-11 (×3): 5 mg via ORAL
  Filled 2015-01-08 (×4): qty 1

## 2015-01-08 NOTE — Progress Notes (Signed)
CRITICAL VALUE ALERT  Critical value received: Blood culture (growing group A strept)  Date of notification:  01/08/2015  Time of notification:  0930  Critical value read back:Yes.    Nurse who received alert:  Lurena NidaGreta Arsema Tusing  MD notified (1st page):  Internal Medicine Teaching Service  Time of first page:  0935  MD notified (2nd page):   Time of second page:  Responding MD:  [facility]  Time MD responded:  [phone]

## 2015-01-08 NOTE — Progress Notes (Signed)
Inpatient Diabetes Program Recommendations  AACE/ADA: New Consensus Statement on Inpatient Glycemic Control (2013)  Target Ranges:  Prepandial:   less than 140 mg/dL      Peak postprandial:   less than 180 mg/dL (1-2 hours)      Critically ill patients:  140 - 180 mg/dL   Results for [name], [name] (MRN [id number]) as of 01/08/2015 09:59  Ref. Range 01/08/2015 06:16  Glucose Latest Range: 70-99 mg/dL 742254 (H)   Diabetes history: None  Inpatient Diabetes Program Recommendations  Correction (SSI): Noted patient's lab glucose this am is 254 mg/dl at [id number]. Patient recieved one time dose of dexamethasone 4mg  post procedure yesterday. Please consider ordering CBG's and Novolog 0-9 units (sensitive scale) ACHS to cover steroid induced hyperglycemia.  Thanks,  Christena DeemShannon Janda Cargo RN, MSN, [facility] Inpatient Diabetes Coordinator Team Pager [phone]

## 2015-01-08 NOTE — Op Note (Signed)
NAMEarlean Shawl:  [name], [name]               ACCOUNT NO.:  [id number]  MEDICAL RECORD NO.:  [id number]  LOCATION:  [id number]                        FACILITY:  [facility]  PHYSICIAN:  Dionne AnoWilliam M. Soraya Paquette, M.D.DATE OF BIRTH:  [birth year]  DATE OF PROCEDURE:  01/07/2015 DATE OF DISCHARGE:                              OPERATIVE REPORT   PREOPERATIVE DIAGNOSIS:  Abscess right arm with impending compartment syndrome phenomenon.  POSTOPERATIVE DIAGNOSIS:  Abscess right arm with impending compartment syndrome phenomenon.  PROCEDURE: 1. Irrigation and debridement of deep abscess, antecubital fossa,     right forearm. 2. Excision of occluded venous system including the cephalic vein. 3. Fasciotomy upper arm and forearm secondary to impending compartment     syndrome.  SURGEON:  Dionne AnoWilliam M. Amanda PeaGramig, M.D.  ASSISTANT:  Karie ChimeraBrian Buchanan, PA-C.  COMPLICATIONS:  None.  DRAINS:  Penrose drain placed x2.  ESTIMATED BLOOD LOSS:  Minimal.  INDICATIONS:  This patient is a 29 year old female, who presents with the above-mentioned diagnosis.  She has an abscess.  She has a history of drug addiction unfortunately.  She appears to be admitted on January 06, 2015, and placed on antibiotics.  I saw her today after a consultation and immediately prepped her for surgical avenues of care.  She was taken to the procedure suite, underwent induction of general anesthesia, laid supine, appropriately padded, prepped and draped in usual sterile fashion with Betadine scrub and paint.  I supervised the scrub and paint.  Following this, final time-out was called.  Once this was done, the patient underwent a curvilinear incision across the arm. A transverse incision was made over the antecubital fossa.  Limbs proximally and distally were created.  Following this, we dissected down, evacuated a large amount of abscess.  This was a deep abscess in nature, it was sent for culture; aerobic and anaerobic cultures. Following this, we  then identified a large clotted portion of the cephalic and basilic systems.  I placed a vessel loop type apparatus about these and noted they were completely clotted.  At this time, I then very carefully dissected out the lateral antebrachial cutaneous nerve and the medial antebrachial cutaneous nerve branches.  I performed an extensive neurolysis as they were encased in inflammatory tissue.  I then performed a fasciotomy of the biceps lacertus fibrosis and the forearm musculature.  This was a fasciotomy without difficulty.  Muscles looked good, breathed well, and there was no necrosis.  Following this, I resected a 6 cm segment of the venous system without difficulty.  This was resected primarily about the basilic, although there were some cross over to the cephalic system.  Following this, we irrigated with 6 L of saline, loosely closed the wound with Prolene and 2 Penrose drains were placed.  We will plan for elevation, range of motion, dressing change in 48 hours, and instructed on wet-to-dry measures.  I have discussed the relevant issues, do's and don'ts.  All questions have been encouraged and answered.  I think that certainly this is a difficult patient given the drug addiction.  We will be on board for her wound care.  Certainly at present time, she is much more stable, abscess is evacuated, and hopefully she  will improve readily.  She will need wet-to-dry dressing changes instructed by Therapy and we will begin this Thursday.  These notes have been discussed.  All questions have been encouraged and answered.  Should any problems arise, we will be immediately available.     Dionne Ano. Amanda Pea, M.D.     [facility]  D:  01/07/2015  T:  01/08/2015  Job:  [id number]

## 2015-01-08 NOTE — Progress Notes (Signed)
Subjective: Overnight, there was an issue with the nursing staff concerned that the patient was smoking/using drugs in her room. I talked with her nurse from the daytime yesterday and he had no issues or concerns with her. I also spoke with the patient and she denies any drug use or having drugs on her. She states she was trying to grab her chapstick and throw out her cigarettes. She did admit to having needles in her bag because she was staying at a friend's house and threw her stuff in her bag before coming to the hospital.  For whatever reason she was denied pain medicine last night and this is unacceptable. She was in tears this morning from the pain. The patient should not suffer from pain while under our care regardless of her drug use history. She had extensive surgery on her arm and has every reason to be in pain. I spoke with her day nurse today and she understands that the patient is allowed to receive all pain medications that are ordered. I will speak to our night team in regards to this as well.   Patient reports significant pain this morning. Will change pain medication to dilaudid. She reports some nausea, but denies vomiting. Blood cultures are growing Group A Strep (strep pyogenes). Antibiotics switched to Ancef. Discussed with patient that she will need PICC line for IV antibiotics for 2 weeks total. She is in agreement to go to a SNF to receive her antibiotic treatment.   Objective: Vital signs in last 24 hours: Filed Vitals:   01/07/15 1930 01/07/15 2013 01/08/15 0547 01/08/15 0630  BP: 135/96 128/84 101/64 137/96  Pulse: 80 76  72  Temp: 97.7 F (36.5 C) 97.8 F (36.6 C) 97.4 F (36.3 C) 97.6 F (36.4 C)  TempSrc:  Oral Oral Oral  Resp: Height:      Weight:      SpO2: 100% 100% 98% 100%   Weight change:   Intake/Output Summary (Last 24 hours) at 01/08/15 1135 Last data filed at 01/08/15 1059  Gross per 24 hour  Intake   1203 ml  Output   3150 ml  Net   -1947 ml   Physical Exam General: alert, sitting up in bed, diaphoretic, appears uncomfortable, tearful  HEENT: [place]/AT, EOMI, mucus membranes moist CV: RRR, early systolic murmur heard best at 2nd right intercostal space Pulm: CTA bilaterally, breaths non-labored Abd: BS+, soft, non-tender Ext: R extremity wrapped in ace bandage. Extremity tender to touch.  Neuro: alert and oriented x 3, no focal deficits  Lab Results: Basic Metabolic Panel:  Recent Labs Lab 01/07/15 0558 01/08/15 0616  NA 136 139  K 3.8 3.8  CL 110 108  CO2 21 20  GLUCOSE 130* 254*  BUN 5* 6  CREATININE 0.60 0.65  CALCIUM 8.0* 8.5  MG 1.6 2.0  PHOS 1.1* 2.4   Liver Function Tests:  Recent Labs Lab 01/07/15 0558  AST 47*  ALT 42*  ALKPHOS 58  BILITOT 0.7  PROT 5.4*  ALBUMIN 2.6*   Micro Blood cx 2/22>> Group A Strep (S. Pyogenes) Wound cx 2/23>> abundant WBCs, rare Gram + cocci in pairs, rare GNRs Gram stain 2/23>> Abundant WBCs, rare gram + cocci in pairs, GNRs Aerobic culture 2/23>> Abundant WBCs, rare gram + cocci in pairs, rare GNRs Blood cx 2/24>>   Studies/Results: [place] Extrem Up Right Ltd  01/06/2015   CLINICAL DATA:  Focal swelling of right antecubital region.  EXAM:  ULTRASOUND right UPPER EXTREMITY LIMITED  TECHNIQUE: Ultrasound examination of the upper extremity soft tissues was performed in the area of clinical concern.  COMPARISON:  None.  FINDINGS: 2.9 x 2.8 x 1.6 cm complex fluid collection is seen in soft tissues of right antecubital fossa with surrounding edema concerning for possible abscess.  IMPRESSION: 2.9 cm complex fluid collection seen in soft tissues of right antecubital fossa concerning for possible abscess.   Electronically Signed   By: Lupita Raider, M.D.   On: 01/06/2015 15:11   Medications: I have reviewed the patient's current medications. Scheduled Meds: .  ceFAZolin (ANCEF) IV  1 g Intravenous 3 times per day  . citalopram  40 mg Oral Daily  . gabapentin  300 mg  Oral TID  . insulin aspart  0-15 Units Subcutaneous TID WC  . senna  1 tablet Oral BID   Continuous Infusions:  PRN Meds:.diazepam, HYDROmorphone (DILAUDID) injection, methocarbamol **OR** methocarbamol (ROBAXIN)  IV, ondansetron **OR** ondansetron (ZOFRAN) IV, oxyCODONE Assessment/Plan:  Suppurative Thrombophlebitis with S. pyogenes Bacteremia: Ortho took patient to OR for I&D last night and found a large thrombosis in the cephalic and basilic veins in addition to the deep abscess. She has remained afebrile, but in significant pain. Blood cultures growing Group A Strep (Strep pyogenes). Patient switched from Vanc to Ancef given her penicillin allergy. She will require 2 weeks total of IV antibiotic therapy. Will focus on pain control.  - Ortho following, appreciate recommendations and help with this case - Dressing changes per Ortho - Continue Ancef IV - Consult SW for SNF placement - f/u repeat blood cultures - f/u wound cultures - Continue to monitor fever curve and WBC count - Dilaudid 1 mg Q4H PRN  - Oxycodone 5-10 mg PO Q3H PRN  Positive Hepatitis C Antibody: Patient is unclear of her hepatitis C status. She states she has been told before that she had hepatitis C but does not know if she had treatment. Likely she did not receive treatment. Will check RNA to determine if her antibody is positive from exposure or active infection. 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 - f/u Hep C RNA - Check Hep A and B antibodies to determine immunity. If not immune will administer vaccines.  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 She is unsure of the cause, but no seizures in 3 years, per pt. She is not on medication for her seizures. - Seizure precautions  Hypertension: BP 137/96. Likely elevated secondary to pain.  - Continue to monitor - Pain control   H/O cardiac murmur: Early systolic murmur heard on exam today, consistent with a flow  murmur. Patient states she has had murmur since she was a child. Echo showed EF 65-70%, no wall motion abnormalities, no atrial defect or PFO present.   Anxiety: Patient takes Valium 1 mg BID, Celexa 40 mg daily, and Ambien at home. Will restart these medications. - Start Ambien 5 mg QHS PRN - Start Valium 1 mg Q12H PRN - Start Celexa 40 mg daily, LFTs only mildly elevated on admission which is not a contraindication.   Diet: Regular  VTE PPx: SCDs Dispo: Likely will go to SNF in 1-2 days.   The patient does not have a current PCP (Pcp Not In System) and does need an [facility] [place] hospital follow-up appointment after discharge.  The patient does not have transportation limitations that hinder transportation to clinic appointments.  .Services Needed  at time of discharge: Y = Yes, Blank = No PT:   OT:   RN:   Equipment:   Other:     LOS: 2 days   Rich Numberarly Rivet, MD 01/08/2015, 11:35 AM

## 2015-01-08 NOTE — Progress Notes (Signed)
Occupational Therapy Evaluation Patient Details Name: [name] MRN: [id number] DOB: [birth year] Today's Date: 01/08/2015    History of Present Illness [name] is a 29 year old woman with a history of intravenous heroin abuse, cocaine abuse, marijuana abuse, hypertension, and seizure disorder who presents with a 2 day history of fevers and pain in the right antecubital fossa. The pain and swelling began after injecting herself in that area with heroin.Admittedfor a right antecubital cellulitis and possible abscess. 2/23 -Pt s/p  Irrigation and debridement of deep abscess, antecubital fossa, Excision of occluded venous system including the cephalic vein. Fasciotomy upper arm and forearm secondary to impending compartment    Clinical Impression   PTA, pt was independent with ADL and mobility. Pt currently requires Min A for ADL.  Pt will benefit from OT to maximize functional level of independence with ADL and increase functional use of RUE. Pt will follow up with Dr. Butler DenmarkGrammig, who will initiate outpt OT if needed.     Follow Up Recommendations  Other (comment) Directed by Dr. Butler DenmarkGrammig - outpt OT when appropriate   Equipment Recommendations  None recommended by OT    Recommendations for Other Services       Precautions / Restrictions Precautions Precautions: Fall Precaution Comments: most likely due to meds Restrictions Weight Bearing Restrictions: No Other Position/Activity Restrictions: elevation of R UE      Mobility Bed Mobility Overal bed mobility: Independent                Transfers Overall transfer level: Needs assistance Equipment used: None Transfers: Sit to/from Stand Sit to Stand: Min guard (for safety due to meds)              Balance Overall balance assessment: No apparent balance deficits (not formally assessed)                              ADL Overall ADL's : Needs assistance/impaired     Grooming: Set up   Upper Body  Bathing: Minimal assitance   Lower Body Bathing: Set up;Supervison/ safety   Upper Body Dressing : Minimal assistance   Lower Body Dressing: Set up;Supervision/safety   Toilet Transfer: Min guard Toilet Transfer Details (indicate cue type and reason): steady A ?due to meds Toileting- Clothing Manipulation and Hygiene: Supervision/safety;Sit to/from stand       Functional mobility during ADLs: Min guard General ADL Comments: Began educationon comepnsatory techniques for ADL. Educated pt on importance of elevation for edema control                     Pertinent Vitals/Pain Pain Assessment: 0-10 Pain Score: 6  Pain Location: R arm Pain Descriptors / Indicators: Aching Pain Intervention(s): Limited activity within patient's tolerance;Repositioned     Hand Dominance Right   Extremity/Trunk Assessment Upper Extremity Assessment Upper Extremity Assessment: RUE deficits/detail RUE Deficits / Details: R elbow splinted.shoulder and hand ROM WFL. sensation intact. no c/o numbness RUE: Unable to fully assess due to immobilization RUE Coordination: decreased gross motor   Lower Extremity Assessment Lower Extremity Assessment: Overall WFL for tasks assessed   Cervical / Trunk Assessment Cervical / Trunk Assessment: Normal   Communication Communication Communication: No difficulties   Cognition Arousal/Alertness: Awake/alert Behavior During Therapy: WFL for tasks assessed/performed Overall Cognitive Status: Within Functional Limits for tasks assessed  Exercises Exercises: Other exercises Other Exercises Other Exercises: shoulder FF; ER abduction (horizontal). educated on importance of moving through ROM to prevent tightness in shoulder Other Exercises: composite flexion/extension Other Exercises: squeeze ball         Home Living Family/patient expects to be discharged to:: Private residence Living Arrangements: Other  relatives Available Help at Discharge: Family Type of Home: House Home Access: Level entry     Home Layout: One level     Bathroom Shower/Tub: Human resources officer characteristics: Door Firefighter: Standard Bathroom Accessibility: Yes How Accessible: Accessible via walker Home Equipment: Shower seat          Prior Functioning/Environment Level of Independence: Independent             OT Diagnosis: Generalized weakness;Acute pain   OT Problem List: Decreased strength;Decreased range of motion;Decreased coordination;Decreased knowledge of precautions;Impaired UE functional use;Pain;Increased edema   OT Treatment/Interventions: Self-care/ADL training;Therapeutic exercise;Therapeutic activities;Patient/family education    OT Goals(Current goals can be found in the care plan section) Acute Rehab OT Goals Patient Stated Goal: to use my arm OT Goal Formulation: With patient Time For Goal Achievement: 01/22/15 Potential to Achieve Goals: Good  OT Frequency: Min 3X/week   Barriers to D/C:    unsure of caregiver support       Co-evaluation              End of Session Nurse Communication: Mobility status;Precautions  Activity Tolerance: Patient tolerated treatment well Patient left: in bed;with call bell/phone within reach;with bed alarm set   Time: 8341-9622 OT Time Calculation (min): 25 min Charges:  OT General Charges $OT Visit: 1 Procedure OT Evaluation $Initial OT Evaluation Tier I: 1 Procedure OT Treatments $Self Care/Home Management : 8-22 mins G-Codes:    [name],HILLARY 01/15/2015, 11:18 AM   Luisa Dago, OTR/L  [phone] 2015/01/15

## 2015-01-08 NOTE — Evaluation (Signed)
Physical Therapy Evaluation Patient Details Name: [name] MRN: [id number] DOB: 04-08-1986 Today's Date: 01/08/2015   History of Present Illness  [name] is a 29 year old woman with a history of intravenous heroin abuse, cocaine abuse, marijuana abuse, hypertension, and seizure disorder who presents with a 2 day history of fevers and pain in the right antecubital fossa. The pain and swelling began after injecting herself in that area with heroin. As the fevers climb to 103 at home and the pain progressively worsened she presented to the emergency department for further evaluation and was admitted to the internal medicine teaching service for a right antecubital cellulitis and possible abscess. Pt s/p  Irrigation and debridement of deep abscess, antecubital fossa, Excision of occluded venous system including the cephalic vein. Fasciotomy upper arm and forearm secondary to impending compartment   Clinical Impression  Pt presents with gait instability affecting her Independence and safety. Pt was given IV morphine prior to gait which most likely contributed to her gait instability.  Pt would benefit from skilled PT in the acute setting to address the above. Pt reports she can stay with her cousin after d/c who can provide Supervision as needed.     Follow Up Recommendations No PT follow up    Equipment Recommendations  None recommended by PT    Recommendations for Other Services       Precautions / Restrictions Precautions Precautions: Fall Precaution Comments: unsteady with gait possibly due to meds Restrictions Weight Bearing Restrictions: No Other Position/Activity Restrictions: elevation of R UE      Mobility  Bed Mobility Overal bed mobility: Independent                Transfers Overall transfer level: Modified independent Equipment used: None                Ambulation/Gait Ambulation/Gait assistance: Min guard Ambulation Distance (Feet): 90  Feet Assistive device: None Gait Pattern/deviations: Step-through pattern;Decreased stride length Gait velocity: decreased   General Gait Details: Decreased hip/knee flexion with gait. Pt reported joint stiffness due to fever and infection. Pt overall unsteady with gait and received morphine immediately prior to gait possibly affecting her balance.  Stairs            Wheelchair Mobility    Modified Rankin (Stroke Patients Only)       Balance Overall balance assessment: Needs assistance Sitting-balance support: Feet supported Sitting balance-Leahy Scale: Normal     Standing balance support: No upper extremity supported Standing balance-Leahy Scale: Fair                               Pertinent Vitals/Pain Pain Assessment: 0-10 Pain Score: 8  Pain Location: R UE Pain Intervention(s): Monitored during session;RN gave pain meds during session    Home Living Family/patient expects to be discharged to:: Private residence Living Arrangements: Other relatives Available Help at Discharge: Family Type of Home: House (cousin's house) Home Access: Level entry     Home Layout: One level        Prior Function Level of Independence: Independent               Hand Dominance        Extremity/Trunk Assessment   Upper Extremity Assessment: Defer to OT evaluation           Lower Extremity Assessment:  (stiffness noted during gait, MMT WNL)      Cervical / Trunk  Assessment: Normal  Communication   Communication: No difficulties  Cognition Arousal/Alertness: Awake/alert Behavior During Therapy: WFL for tasks assessed/performed Overall Cognitive Status: Within Functional Limits for tasks assessed                      General Comments      Exercises        Assessment/Plan    PT Assessment Patient needs continued PT services  PT Diagnosis Difficulty walking   PT Problem List Decreased activity tolerance;Decreased  balance;Decreased mobility;Pain  PT Treatment Interventions Gait training;Functional mobility training;Therapeutic activities;Therapeutic exercise;Balance training;Patient/family education   PT Goals (Current goals can be found in the Care Plan section) Acute Rehab PT Goals Patient Stated Goal: None stated PT Goal Formulation: With patient Time For Goal Achievement: 01/19/15 Potential to Achieve Goals: Good    Frequency Min 3X/week   Barriers to discharge        Co-evaluation               End of Session Equipment Utilized During Treatment: Gait belt Activity Tolerance: Patient tolerated treatment well Patient left: in bed;with call bell/phone within reach;with nursing/sitter in room Nurse Communication: Mobility status         Time: 0825-0852 PT Time Calculation (min) (ACUTE ONLY): 27 min   Charges:   PT Evaluation $Initial PT Evaluation Tier I: 1 Procedure PT Treatments $Gait Training: 8-22 mins   PT G Codes:        Greggory StallionWrisley, Tiyana Galla Kerstine 01/08/2015, 9:08 AM

## 2015-01-08 NOTE — Progress Notes (Signed)
ANTIBIOTIC CONSULT NOTE - INITIAL  Pharmacy Consult:  Ancef Indication:  Group A Strep bacteremia  Allergies  Allergen Reactions  . Amoxicillin Other (See Comments)    diarrhea  . Penicillins Cross Reactors Hives, Nausea And Vomiting and Swelling    Patient Measurements: Height: 5' (152.4 cm) Weight: 121 lb 7.6 oz (55.1 kg) IBW/kg (Calculated) : 45.5  Vital Signs: Temp: 97.6 F (36.4 C) (02/24 0630) Temp Source: Oral (02/24 0630) BP: 137/96 mmHg (02/24 0630) Pulse Rate: 72 (02/24 0630) Intake/Output from previous day: 02/23 0701 - 02/24 0700 In: 1403 [P.O.:603; I.V.:800] Out: 2850 [Urine:2800; Blood:50] Intake/Output from this shift: Total I/O In: 160 [P.O.:160] Out: -   Labs:  Recent Labs  01/06/15 0732 01/06/15 1858 01/07/15 0558 01/08/15 0616  WBC 11.4*  --  6.2 6.3  HGB 13.7  --  10.9* 11.1*  PLT 184  --  140* 170  CREATININE 0.92 0.69 0.60 0.65   Estimated Creatinine Clearance: 80 mL/min (by C-G formula based on Cr of 0.65). No results for input(s): VANCOTROUGH, VANCOPEAK, VANCORANDOM, GENTTROUGH, GENTPEAK, GENTRANDOM, TOBRATROUGH, TOBRAPEAK, TOBRARND, AMIKACINPEAK, AMIKACINTROU, AMIKACIN in the last 72 hours.   Microbiology: Recent Results (from the past 720 hour(s))  Culture, blood (routine x 2)     Status: None   Collection Time: 01/06/15  7:32 AM  Result Value Ref Range Status   Specimen Description BLOOD LEFT HAND  Final   Special Requests BOTTLES DRAWN AEROBIC AND ANAEROBIC 5CC  Final   Culture   Final    GROUP A STREP (S.PYOGENES) ISOLATED Note: CRITICAL RESULT CALLED TO, READ BACK BY AND VERIFIED WITH: GRETA SCALCO @ 0921 ON [id number] BY [facility] FAXED TO [place] [facility] CONNIE WEANT [id number] BY PEAKY Note: Gram Stain Report Called to,Read Back By and Verified With: TRIMAINE BUTLER 5:25AM 01/07/15 THOMI Performed at [facility]    Report Status 01/08/2015 FINAL  Final  Culture, blood (routine x 2)     Status: None   Collection Time: 01/06/15   8:00 AM  Result Value Ref Range Status   Specimen Description BLOOD LEFT HAND  Final   Special Requests BOTTLES DRAWN AEROBIC AND ANAEROBIC 5CC  Final   Culture   Final    GROUP A STREP (S.PYOGENES) ISOLATED Note: CRITICAL RESULT CALLED TO, READ BACK BY AND VERIFIED WITH: GRETA SCALCO @ 0921 ON [id number] BY [facility] FAXED TO [place] [facility] CONNIE WEANT [id number] BY PEAKY Note: Gram Stain Report Called to,Read Back By and Verified With: Judee Clara 3:15AM 01/07/15 THOMI Performed at [facility]    Report Status 01/08/2015 FINAL  Final  Urine culture     Status: None   Collection Time: 01/06/15  9:29 AM  Result Value Ref Range Status   Specimen Description URINE, CLEAN CATCH  Final   Special Requests NONE  Final   Colony Count NO GROWTH Performed at [facility]   Final   Culture NO GROWTH Performed at [facility]   Final   Report Status 01/07/2015 FINAL  Final  MRSA PCR Screening     Status: None   Collection Time: 01/06/15  6:48 PM  Result Value Ref Range Status   MRSA by PCR NEGATIVE NEGATIVE Final    Comment:        The GeneXpert MRSA Assay (FDA approved for NASAL specimens only), is one component of a comprehensive MRSA colonization surveillance program. It is not intended to diagnose MRSA infection nor to guide or monitor treatment for MRSA  infections.   Anaerobic culture     Status: None (Preliminary result)   Collection Time: 01/07/15  5:36 PM  Result Value Ref Range Status   Specimen Description ABSCESS BLOOD RIGHT FOREARM  Final   Special Requests PATIENT ON FOLLOWING ROCEPHIN VANCOMYCIN  Final   Gram Stain   Final    ABUNDANT WBC PRESENT,BOTH PMN AND MONONUCLEAR NO SQUAMOUS EPITHELIAL CELLS SEEN RARE GRAM POSITIVE COCCI IN PAIRS RARE GRAM NEGATIVE RODS Performed at [facility]    Culture   Final    NO ANAEROBES ISOLATED; CULTURE IN PROGRESS FOR 5 DAYS [facility]formed at [facility]    Report Status PENDING  Incomplete   Gram stain     Status: None   Collection Time: 01/07/15  5:36 PM  Result Value Ref Range Status   Specimen Description ABSCESS BLOOD RIGHT FOREARM  Final   Special Requests PATIENT ON FOLLOWING ROCEPHIN VANCOMYCIN  Final   Gram Stain   Final    ABUNDANT WBC PRESENT,BOTH PMN AND MONONUCLEAR RARE GRAM POSITIVE COCCI IN PAIRS RARE GRAM NEGATIVE RODS Gram Stain Report Called to,Read Back By and Verified With: [facility] 01/07/15 1854 M [facility]    Report Status 01/07/2015 FINAL  Final  Culture, routine-abscess     Status: None (Preliminary result)   Collection Time: 01/07/15  5:36 PM  Result Value Ref Range Status   Specimen Description ABSCESS BLOOD RIGHT FOREARM  Final   Special Requests PATIENT ON FOLLOWING ROCEPHIN VANCOMYCIN  Final   Gram Stain   Final    ABUNDANT WBC PRESENT,BOTH PMN AND MONONUCLEAR NO SQUAMOUS EPITHELIAL CELLS SEEN RARE GRAM POSITIVE COCCI IN PAIRS RARE GRAM NEGATIVE RODS Gram Stain Report Called to,Read Back By and Verified With: Gram Stain Report Called to,Read Back By and Verified With: DR [facility] 01/07/15 1854 BY Judie PetitM [facility] Performed at [facility]    Culture PENDING  Incomplete   Report Status PENDING  Incomplete    Medical History: Past Medical History  Diagnosis Date  . Hypertension   . Heart murmur   . Seizures   . Cocaine abuse 01/06/2015     Assessment: 28 YOF with history of IVDA admitted with complaint of pain in the right antecubital fossa.  Patient was started on broad spectrum antibiotics for cellulitis initially.  Antibiotics to be narrowed to Ancef for Group A Strep bacteremia and abscess.  Her renal function has been stable.  Vanc 2/22 >> 2/24 Cefepime 2/22 >> 2/22 CTX 2/23 >> 2/24 Ancef 2/24 >>  2/22 BCx x2 - Group A (Strep pyogenes) 2/22 UCx - negative 2/22 MRSA PCR - negative 2/23 forearm abscess (anaerobic) - GPC / GNR on Gram stain 2/23 forearm abscess cx - GPC / GNR on Gram stain 2/24 BCx x2 -   Goal of Therapy:   Resolution of infection   Plan:  - Ancef 1gm IV Q8H, start tonight as patient already received Rocephin this AM - Pharmacy will sign off as dosage adjustment likely unnecessary.  Thank you for the consult!    Bexleigh Theriault D. Laney Potashang, PharmD, BCPS Pager:  [phone] 01/08/2015, 10:14 AM

## 2015-01-09 ENCOUNTER — Encounter (HOSPITAL_COMMUNITY): Payer: Self-pay | Admitting: Orthopedic Surgery

## 2015-01-09 DIAGNOSIS — F418 Other specified anxiety disorders: Secondary | ICD-10-CM

## 2015-01-09 DIAGNOSIS — B1920 Unspecified viral hepatitis C without hepatic coma: Secondary | ICD-10-CM

## 2015-01-09 LAB — HEPATITIS B SURFACE ANTIBODY,QUALITATIVE: HEP B S AB: NONREACTIVE

## 2015-01-09 LAB — GLUCOSE, CAPILLARY
GLUCOSE-CAPILLARY: 89 mg/dL (ref 70–99)
GLUCOSE-CAPILLARY: 144 mg/dL — AB (ref 70–99)
GLUCOSE-CAPILLARY: 106 mg/dL — AB (ref 70–99)
Glucose-Capillary: 154 mg/dL — ABNORMAL HIGH (ref 70–99)

## 2015-01-09 LAB — CBC
HCT: 33 % — ABNORMAL LOW (ref 36.0–46.0)
Hemoglobin: 11 g/dL — ABNORMAL LOW (ref 12.0–15.0)
MCH: 28.6 pg (ref 26.0–34.0)
MCHC: 33.3 g/dL (ref 30.0–36.0)
MCV: 85.7 fL (ref 78.0–100.0)
PLATELETS: 193 10*3/uL (ref 150–400)
RBC: 3.85 MIL/uL — ABNORMAL LOW (ref 3.87–5.11)
RDW: 15 % (ref 11.5–15.5)
WBC: 7.6 10*3/uL (ref 4.0–10.5)

## 2015-01-09 LAB — BASIC METABOLIC PANEL
ANION GAP: 9 (ref 5–15)
BUN: <5 mg/dL — ABNORMAL LOW (ref 6–23)
CALCIUM: 8 mg/dL — AB (ref 8.4–10.5)
CO2: 24 mmol/L (ref 19–32)
Chloride: 106 mmol/L (ref 96–112)
Creatinine, Ser: 0.69 mg/dL (ref 0.50–1.10)
GFR calc Af Amer: >90 mL/min (ref >90)
Glucose, Bld: 91 mg/dL (ref 70–99)
Potassium: 3.6 mmol/L (ref 3.5–5.1)
SODIUM: 139 mmol/L (ref 135–145)

## 2015-01-09 LAB — HEPATITIS A ANTIBODY, TOTAL: HEP A TOTAL AB: NONREACTIVE

## 2015-01-09 LAB — HCV RNA QUANT
HCV QUANT LOG: 6.44 {Log} — AB (ref <1.18)
HCV Quantitative: 2777180 IU/mL — ABNORMAL HIGH (ref <15)

## 2015-01-09 MED ORDER — TIZANIDINE HCL 2 MG PO TABS
2.0000 mg | ORAL_TABLET | Freq: Four times a day (QID) | ORAL | Status: DC | PRN
  Administered 2015-01-09 – 2015-01-10 (×2): 2 mg via ORAL
  Filled 2015-01-09 (×4): qty 1

## 2015-01-09 MED ORDER — MUPIROCIN 2 % EX OINT
TOPICAL_OINTMENT | CUTANEOUS | Status: AC
  Filled 2015-01-09: qty 22

## 2015-01-09 MED ORDER — HEPATITIS B VAC RECOMBINANT 10 MCG/ML IJ SUSP
1.0000 mL | Freq: Once | INTRAMUSCULAR | Status: AC
  Administered 2015-01-09: 10 ug via INTRAMUSCULAR
  Filled 2015-01-09: qty 1

## 2015-01-09 MED ORDER — HEPATITIS A VACCINE 1440 EL U/ML IM SUSP
1.0000 mL | Freq: Once | INTRAMUSCULAR | Status: AC
Start: 2015-01-09 — End: 2015-01-09
  Administered 2015-01-09: 1440 [IU] via INTRAMUSCULAR
  Filled 2015-01-09: qty 1

## 2015-01-09 NOTE — Progress Notes (Signed)
Subjective: 2 Days Post-Op Procedure(s) (LRB): IRRIGATION AND DEBRIDEMENT EXTREMITY (Right) Chart reviewed, blood cultures positive for Streptococcus pyogenes. Her operative tissue cultures are still pending. The patient complains of pain about the proximal aspect of her arm into the axillary region. She denies any nausea, vomiting at this juncture.   Objective: Vital signs in last 24 hours: Temp:  [98 F (36.7 C)-98.6 F (37 C)] 98 F (36.7 C) (02/25 1417) Pulse Rate:  [72-78] 78 (02/25 1417) Resp:  [15-20] 20 (02/25 1417) BP: (128-142)/(84-90) 134/84 mmHg (02/25 1417) SpO2:  [97 %-100 %] 100 % (02/25 1417)  Intake/Output from previous day: 02/24 0701 - 02/25 0700 In: 810 [P.O.:760; IV Piggyback:50] Out: 6050 [Urine:6050] Intake/Output this shift: Total I/O In: 342 [P.O.:342] Out: 900 [Urine:900]   Recent Labs  01/07/15 0558 01/08/15 0616 01/09/15 0554  HGB 10.9* 11.1* 11.0*    Recent Labs  01/08/15 0616 01/09/15 0554  WBC 6.3 7.6  RBC 4.01 3.85*  HCT 34.0* 33.0*  PLT 170 193    Recent Labs  01/08/15 0616 01/09/15 0554  NA 139 139  K 3.8 3.6  CL 108 106  CO2 20 24  BUN 6 <5*  CREATININE 0.65 0.69  GLUCOSE 254* 91  CALCIUM 8.5 8.0*   No results for input(s): LABPT, INR in the last 72 hours.  The patient is awake, alert and oriented  Focused examination of the right upper extremity: Her splint and dressings are removed. Evaluation shows she still has surrounding erythema and cellulitic changes mild swelling about the proximal medial biceps region she has mild lymphadenopathy about the axillary region. The incision however is intact to the drains are removed without difficulties. There is no gross purulence expressed from the wounds. Copious irrigation was implemented with greater than a liter of normal saline into the wounds following this the wounds were packed with wet-to-dry dressing changes and a soft wrap placed. The upper extremity is then elevated on  2 pillows.  Assessment/Plan: 2 Days Post-Op Procedure(s) (LRB): IRRIGATION AND DEBRIDEMENT EXTREMITY (Right)  . Patient Active Problem List   Diagnosis Date Noted  . Abscess of forearm, right 01/07/2015  . Hepatitis C, chronic 01/07/2015  . Heroin abuse   . Gram-positive cocci bacteremia   . Seizure disorder 01/06/2015  . Hypertension 01/06/2015  . H/O cardiac murmur 01/06/2015  . IVDU (intravenous drug user) 01/06/2015  . Cocaine abuse 01/06/2015   We will plan for therapy on a daily basis, we will have the dressings removed perform light irrigation to the open incision sites followed by wet-to-dry dressing changes and Bactroban to be placed on the surrounding skin. It will be important that we elevate the arm encouraged finger range of motion in addition gentle wrist range of motion and shoulder range of motion. We will have a warm K pad applied to the upper extremity and she may utilize a sling for ambulatory activities. Continue close follow-up.  Nael Petrosyan L 01/09/2015, 4:57 PM

## 2015-01-09 NOTE — Clinical Social Work Placement (Signed)
Clinical Social Work Department CLINICAL SOCIAL WORK PLACEMENT NOTE 01/09/2015  Patient:  Ruffin FrederickVERY,Symphanie D  Account Number:  [id number] Admit date:  01/06/2015  Clinical Social Worker:  Cherre BlancJOSEPH BRYANT Lerlene Treadwell, [place]  Date/time:  01/09/2015 04:28 PM  Clinical Social Work is seeking post-discharge placement for this patient at the following level of care:   SKILLED NURSING   (*CSW will update this form in Epic as items are completed)   01/09/2015  Patient/family provided with [facility] of Clinical Social Work's list of facilities offering this level of care within the geographic area requested by the patient (or if unable, by the patient's family).  01/09/2015  Patient/family informed of their freedom to choose among providers that offer the needed level of care, that participate in Medicare, Medicaid or managed care program needed by the patient, have an available bed and are willing to accept the patient.  01/09/2015  Patient/family informed of [facility]' ownership interest in [facility], as well as of the fact that they are under no obligation to receive care at this facility.  PASARR submitted to EDS on 01/09/2015 PASARR number received on   FL2 transmitted to all facilities in geographic area requested by pt/family on  01/09/2015 FL2 transmitted to all facilities within larger geographic area on 01/09/2015  Patient informed that his/her managed care company has contracts with or will negotiate with  certain facilities, including the following:     Patient/family informed of bed offers received:   Patient chooses bed at  Physician recommends and patient chooses bed at    Patient to be transferred to  on   Patient to be transferred to facility by  Patient and family notified of transfer on  Name of family member notified:    The following physician request were entered in Epic:   Additional Comments:    Roddie McBryant Omar Orrego MSW, [place]CSWA, [place],  [phone]

## 2015-01-09 NOTE — Clinical Social Work Placement (Signed)
Clinical Social Work Department BRIEF PSYCHOSOCIAL ASSESSMENT 01/09/2015  Patient:  [name],[name]     Account Number:  [id number]     Admit date:  01/06/2015  Clinical Social Worker:  , BRYANT, LCSWA  Date/Time:  01/09/2015 04:05 PM  Referred by:  Physician  Date Referred:  01/09/2015 Referred for  SNF Placement   Other Referral:   NA   Interview type:  Patient Other interview type:   Patient alert and oriented at time of assessment.    PSYCHOSOCIAL DATA Living Status:  ALONE Admitted from facility:   Level of care:   Primary support name:  James Primary support relationship to patient:  PARENT Degree of support available:   Support is limited.    CURRENT CONCERNS Current Concerns  Post-Acute Placement   Other Concerns:   NA    SOCIAL WORK ASSESSMENT / PLAN CSW met with patient at bedside to complete assessment. Patient has been recommended for SNF placement for long term IV antibiotics due to her IVDU history. Patient confirms that she is an active IV drug user and her last use was on 2/20. Patient states that she is agreeable to SNF placement for her IV antibiotics and understands the need for this.    CSW explained LOG SNF search process and answered patient's questions. Patient appears to have good insight into her diagnosis and what has caused her hospitalization. The patient appears to be uncomfortable due to her bandage and states that she is in pain. Overall patient present with normal affect and was engaged in assessment.   Assessment/plan status:  Psychosocial Support/Ongoing Assessment of Needs Other assessment/ plan:   Complete Fl2, Fax, PASRR   Information/referral to community resources:   CSW contact information given. CSW will follow up with any available bed offers.    PATIENT'S/FAMILY'S RESPONSE TO PLAN OF CARE: Patient agreeable to SNF placement for IV antibiotics. CSW will follow up with patient.       Bryant  MSW,  LCSWA, LCASA, [phone] 

## 2015-01-09 NOTE — Progress Notes (Signed)
Orthopedic Tech Progress Note Patient Details:  [name] 1986-01-24 [id number]  Ortho Devices Type of Ortho Device: Shoulder immobilizer Ortho Device/Splint Location: RUE Ortho Device/Splint Interventions: Ordered, Application   [name], [name] 01/09/2015, 5:25 PM

## 2015-01-09 NOTE — Progress Notes (Signed)
Subjective: Patient appears much more comfortable this morning. She states she is having muscle spasms in her right extremity and Robaxin is not helping. Ortho to do dressing change today. Patient will need TEE to assess for vegetations. Will need SNF placement for 2 weeks total of IV antibiotics (needs 10 more days).   Objective: Vital signs in last 24 hours: Filed Vitals:   01/08/15 0547 01/08/15 0630 01/08/15 2157 01/09/15 0524  BP: 101/64 137/96 142/88 128/90  Pulse:  72 75 72  Temp: 97.4 F (36.3 C) 97.6 F (36.4 C) 98.3 F (36.8 C) 98.6 F (37 C)  TempSrc: Oral Oral Oral Oral  Resp: Height:      Weight:      SpO2: 98% 100% 97% 99%   Weight change:   Intake/Output Summary (Last 24 hours) at 01/09/15 1051 Last data filed at 01/09/15 0659  Gross per 24 hour  Intake    650 ml  Output   6050 ml  Net  -5400 ml   Physical Exam General: alert, sitting up in bed, appears comfortable HEENT: [place]/AT, EOMI, mucus membranes moist CV: RRR, early systolic murmur heard best at 2nd right intercostal space Pulm: CTA bilaterally, breaths non-labored Abd: BS+, soft, non-tender Ext: R extremity wrapped in ace bandage. Extremity tender to touch.  Neuro: alert and oriented x 3, no focal deficits  Lab Results: Basic Metabolic Panel:  Recent Labs Lab 01/07/15 0558 01/08/15 0616 01/09/15 0554  NA 136 139 139  K 3.8 3.8 3.6  CL 110 108 106  CO2 GLUCOSE 130* 254* 91  BUN 5* 6 <5*  CREATININE 0.60 0.65 0.69  CALCIUM 8.0* 8.5 8.0*  MG 1.6 2.0  --   PHOS 1.1* 2.4  --    Liver Function Tests:  Recent Labs Lab 01/07/15 0558  AST 47*  ALT 42*  ALKPHOS 58  BILITOT 0.7  PROT 5.4*  ALBUMIN 2.6*   CBC:  Recent Labs Lab 01/06/15 0732  01/08/15 0616 01/09/15 0554  WBC 11.4*  < > 6.3 7.6  NEUTROABS 9.7*  --   --   --   HGB 13.7  < > 11.1* 11.0*  HCT 40.5  < > 34.0* 33.0*  MCV 84.6  < > 84.8 85.7  PLT 184  < > 170 193  < > = values in this  interval not displayed.  Micro Blood cx 2/22>> Strep pyogenes Wound cx 2/23>> abundant WBCs, rare Gram + cocci in pairs, rare GNRs Gram stain 2/23>> Abundant WBCs, rare gram + cocci in pairs, GNRs Anaerobic culture 2/23>> No anaerobes isolated  Blood cx 2/24>> NGTD  Medications: I have reviewed the patient's current medications.  Assessment/Plan:  Suppurative Thrombophlebitis with S. pyogenes Bacteremia: Patient has remained afebrile and WBC count 7.6. Her pain is under control. Ortho to do dressing change today. Patient will need to undergo TEE to assess for vegetations given her  Pyogenes bacteremia. Repeat blood cultures have NGTD. She will need a total of 14 days of IV antibiotics to treat her bacteremia unless vegetations are found. If vegetations present will need total of 6 weeks.  - Ortho following, appreciate recommendations and help with this case - Dressing changes per Ortho - Continue Ancef IV - Consulted SW for SNF placement - f/u repeat blood cultures>> NGTD - f/u wound cultures - Continue to monitor fever curve and WBC count - Dilaudid 1 mg Q4H PRN  - Oxycodone 5-10 mg PO Q3H  PRN - Switch Robaxin to Zanaflex for muscle spasms   Positive Hepatitis C Antibody: Patient is unclear of her hepatitis C status. She states she has been told before that she had hepatitis C but does not know if she had treatment. Likely she did not receive treatment. Will check RNA to determine if her antibody is positive from exposure or active infection. 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 - f/u Hep C RNA - Hep A total antibody and Hep B surface antibody negative indicating no immunity. Will give Hep A (Havrix) and B (Recombivax) vaccines. Patient will need to follow up in 6 to receive Hep A vaccine and 1 month and 6 months for Hep B.   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 She is unsure of the cause, but no seizures in 3 years, per pt.  She is not on medication for her seizures. - Seizure precautions  Hypertension: BP 128/90.  - Continue to monitor  H/O cardiac murmur: Early systolic murmur heard on exam today, consistent with a flow murmur. Patient states she has had murmur since she was a child. Echo showed EF 65-70%, no wall motion abnormalities, no atrial defect or PFO present.   Anxiety: Patient takes Valium 1 mg BID, Celexa 40 mg daily, and Ambien at home.  - Continue Ambien 5 mg QHS PRN - Continue Valium 1 mg Q12H PRN - Continue Celexa 40 mg daily  Diet: Regular  VTE PPx: SCDs Dispo: Will need SNF placement for IV antibiotics.   The patient does not have a current PCP (Pcp Not In System) and does [facility] after discharge.  The patient does not have transportation limitations that hinder transportation to clinic appointments.  .Services Needed at time of discharge: Y = Yes, Blank = No PT:   OT:   RN:   Equipment:   Other:     LOS: 3 days   Rich Numberarly Rivet, MD 01/09/2015, 10:51 AM

## 2015-01-10 ENCOUNTER — Encounter (HOSPITAL_COMMUNITY)
Admission: EM | Disposition: A | Discharge status: Skilled Nursing Facility | Payer: Self-pay | Source: Home / Self Care | Attending: Internal Medicine

## 2015-01-10 ENCOUNTER — Encounter (HOSPITAL_COMMUNITY): Payer: Self-pay | Admitting: Orthopedic Surgery

## 2015-01-10 LAB — CULTURE, ROUTINE-ABSCESS

## 2015-01-10 LAB — GLUCOSE, CAPILLARY
GLUCOSE-CAPILLARY: 108 mg/dL — AB (ref 70–99)
GLUCOSE-CAPILLARY: 132 mg/dL — AB (ref 70–99)
Glucose-Capillary: 131 mg/dL — ABNORMAL HIGH (ref 70–99)
Glucose-Capillary: 127 mg/dL — ABNORMAL HIGH (ref 70–99)

## 2015-01-10 SURGERY — ECHOCARDIOGRAM, TRANSESOPHAGEAL
Anesthesia: Moderate Sedation

## 2015-01-10 MED ORDER — SODIUM CHLORIDE 0.9 % IJ SOLN
10.0000 mL | INTRAMUSCULAR | Status: DC | PRN
  Administered 2015-01-11 – 2015-01-15 (×2): 10 mL
  Filled 2015-01-10 (×2): qty 40

## 2015-01-10 NOTE — Clinical Social Work Note (Signed)
CSW has discussed case with CSW supervisor. Patient is going to be a difficult to place patient due to IVDU.  Roddie McBryant Kerryn Tennant MSW, [place]urel[place]CSWA, [place], [phone]

## 2015-01-10 NOTE — Progress Notes (Signed)
OT Cancellation Note  Patient Details Name: [name] MRN: [id number] DOB: 19-Feb-1986   Cancelled Treatment:    Reason Eval/Treat Not Completed: Patient at procedure or test/ unavailable Pt having wound care. Will return. [facility] Center[facility],HILLARY  [place], OTR/L  [phone] 01/10/2015 01/10/2015, 3:02 PM

## 2015-01-10 NOTE — Progress Notes (Signed)
Patient ID: Jamie FrederickKrystal D Camacho, female   DOB: 1986/04/05, 29 y.o.   MRN: [id number] Medicine attending: I personally examined this patient today together with resident physician Dr. Quita Skyearley Rivet and I attest to  the accuracy of her evaluation and management plan. 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 Blood cultures growing Streptococcus pyogenous. Good quality Trans-thoracic echocardiogram does not show any valvular vegetations. I did not appreciate a cardiac murmur. Other examiners have reported a soft systolic flow murmur. She underwent initial local debridement of the antecubital abscess. She remains afebrile on current antibiotics with cefazolin. She will have a percutaneous central catheter placed today then she will be transferred to an extended care facility to complete an additional 9 days of parenteral antibiotics. She was given initial doses of hepatitis A and hepatitis B vaccines. She will need booster doses of hepatitis A vaccine in 1 month, and hepatitis B vaccine in 6 months. Subsequent follow-[facility] care [facility]. Additional problems identified during this admission include: #1. Hepatitis C positive Quantitative RNA 8,413,[id number],777,180 log 6.44  01/08/2015. Minimal transaminase elevations on liver profile. Normal bilirubin. #2. Questionable history of seizure disorder not currently on medication.

## 2015-01-10 NOTE — Progress Notes (Signed)
Peripherally Inserted Central Catheter/Midline Placement  The IV Nurse has discussed with the patient and/or persons authorized to consent for the patient, the purpose of this procedure and the potential benefits and risks involved with this procedure.  The benefits include less needle sticks, lab draws from the catheter and patient may be discharged home with the catheter.  Risks include, but not limited to, infection, bleeding, blood clot (thrombus formation), and puncture of an artery; nerve damage and irregular heat beat.  Alternatives to this procedure were also discussed.  PICC/Midline Placement Documentation        Timmothy Soursewman, Cannon Arreola Renee 01/10/2015, 10:39 AM

## 2015-01-10 NOTE — Progress Notes (Signed)
Physical Therapy Wound Evaluation andTreatment Patient Details  Name: [name] MRN: [id number] Date of Birth: May 19, 1986  Today's Date: 01/10/2015 Time: 8527-7824 Time Calculation (min): 34 min  Subjective  Subjective: "lets get this over with" Patient and Family Stated Goals: make this better Prior Treatments: s/p I&D 2/23  Pain Score: Pain Score: 10-Worst pain ever, Recommend planning hydrotherapy around administration of pain meds to improve tolerance to hydrotherapy.  Wound Assessment  Wound / Incision (Open or Dehisced) 01/06/15 Other (Comment) Arm Left cellulitis, circled with marker in ED  (Active)  Dressing Type Other (Comment) 01/06/2015  5:26 PM  Drainage Amount None 01/06/2015  5:26 PM     Wound / Incision (Open or Dehisced) 01/10/15 Other (Comment) Arm Right Right antecubital fossa (Active)  Dressing Type Gauze (Comment);Compression wrap 01/10/2015  3:00 PM  Dressing Changed New 01/10/2015  3:00 PM  Dressing Status Clean;Dry;Intact 01/10/2015  3:00 PM  Dressing Change Frequency Daily 01/10/2015  3:00 PM  Site / Wound Assessment Pink;Red;Granulation tissue;Painful 01/10/2015  3:00 PM  Peri-wound Assessment Pink;Erythema (blanchable);Edema 01/10/2015  3:00 PM  Wound Length (cm) 10 cm 01/10/2015  3:00 PM  Wound Width (cm) 0.5 cm 01/10/2015  3:00 PM  Wound Depth (cm) 1.3 cm 01/10/2015  3:00 PM  Closure Surface sutures 01/10/2015  3:00 PM  Drainage Amount Minimal 01/10/2015  3:00 PM  Drainage Description Serosanguineous;No odor 01/10/2015  3:00 PM  Non-staged Wound Description Not applicable 2/35/3614  4:31 PM  Treatment Cleansed;Debridement (Selective);Hydrotherapy (Pulse lavage);Packing (Saline gauze) 01/10/2015  3:00 PM     Incision (Closed) 01/07/15 Arm Right (Active)  Dressing Type Compression wrap 01/10/2015  7:45 AM  Dressing Clean;Dry;Intact 01/10/2015  7:45 AM  Drainage Amount None 01/10/2015  7:45 AM   Hydrotherapy Pulsed lavage therapy - wound location: right  antecubital fossa Pulsed Lavage with Suction (psi): 4 psi Pulsed Lavage with Suction - Normal Saline Used: 1000 mL Pulsed Lavage Tip: Tip with splash shield Selective Debridement Selective Debridement - Location: right antecubital fossa Selective Debridement - Tools Used: Forceps;Scissors (Q tip) Selective Debridement - Tissue Removed: fibrin   Wound Assessment and Plan  Wound Therapy - Assess/Plan/Recommendations Wound Therapy - Clinical Statement: Pt would benefit from pulse lavage to cleanse the wound to allow for debridement and decrease the bacterial load in order to promote wound healing. Wound bed is mostly red/pink and healthy looking with minimal nonviable tissue that was mostly removed after today's treatment. Pt with low pain tolerance. Hydrotherapy may not be indicated for long due to healthy looking incision/wound. Will follow up per request of MD.  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) Hydrotherapy Plan: Debridement;Patient/family education;Pulsatile lavage with suction;Dressing change Wound Therapy - Frequency: 6X / week Wound Therapy - Current Recommendations: WOC nurse;PT;OT Wound Therapy - Follow Up Recommendations: [place] Wound Plan: see above  Wound Therapy Goals- Improve the function of patient's integumentary system by progressing the wound(s) through the phases of wound healing (inflammation - proliferation - remodeling) by: Decrease Necrotic Tissue to: 0% Decrease Necrotic Tissue - Progress: Goal set today Increase Granulation Tissue to: 100% Increase Granulation Tissue - Progress: Goal set today Improve Drainage Characteristics: Min Improve Drainage Characteristics - Progress: Goal set today Goals/treatment plan/discharge plan were made with and agreed upon by patient/family: Yes Time For Goal  Achievement: 6 days Wound Therapy - Potential for Goals: Good  Goals will be updated until  maximal potential achieved or discharge criteria met.  Discharge criteria: when goals achieved, discharge from hospital, MD decision/surgical intervention, no progress towards goals, refusal/missing three consecutive treatments without notification or medical reason.  GP     Candy Sledge A 01/10/2015, 4:07 PM  Candy Sledge, [place], DPT [phone]

## 2015-01-10 NOTE — Progress Notes (Signed)
Physical Therapy Treatment Patient Details Name: Jamie FrederickKrystal D Camacho MRN: [id number] DOB: Aug 06, 1986 Today's Date: 01/10/2015    History of Present Illness [name] is a 29 year old woman with a history of intravenous heroin abuse, cocaine abuse, marijuana abuse, hypertension, and seizure disorder who presents with a 2 day history of fevers and pain in the right antecubital fossa. The pain and swelling began after injecting herself in that area with heroin. As the fevers climb to 103 at home and the pain progressively worsened she presented to the emergency department for further evaluation and was admitted to the internal medicine teaching service for a right antecubital cellulitis and possible abscess. Pt s/p  Irrigation and debridement of deep abscess, antecubital fossa, Excision of occluded venous system including the cephalic vein. Fasciotomy upper arm and forearm secondary to impending compartment     PT Comments    Education on need for sling with ambulation.  No LOB and nearing baseline level of mobility.  Follow Up Recommendations  No PT follow up     Equipment Recommendations  None recommended by PT    Recommendations for Other Services       Precautions / Restrictions Precautions Precautions: Fall Required Braces or Orthoses: Sling    Mobility  Bed Mobility Overal bed mobility: Independent                Transfers Overall transfer level: Modified independent Equipment used: None                Ambulation/Gait Ambulation/Gait assistance: Min guard Ambulation Distance (Feet): 125 Feet Assistive device: None Gait Pattern/deviations: Step-through pattern;Decreased step length - left     General Gait Details: Not as unsteady as the other day, but does appear stiff with decresed step length on the L.  Educated on need to wear sling when up and donned/doffed at EOB.   Stairs            Wheelchair Mobility    Modified Rankin (Stroke Patients  Only)       Balance     Sitting balance-Leahy Scale: Normal       Standing balance-Leahy Scale: Fair                      Cognition Arousal/Alertness: Awake/alert Behavior During Therapy: WFL for tasks assessed/performed Overall Cognitive Status: Within Functional Limits for tasks assessed                      Exercises      General Comments        Pertinent Vitals/Pain Pain Assessment: 0-10 Pain Score: 5  Pain Location: R elbow and 4/10 at new PICC line Pain Descriptors / Indicators: Aching Pain Intervention(s): Premedicated before session;Monitored during session;Repositioned    Home Living                      Prior Function            PT Goals (current goals can now be found in the care plan section) Acute Rehab PT Goals Patient Stated Goal: to use my arm PT Goal Formulation: With patient Time For Goal Achievement: 01/19/15 Potential to Achieve Goals: Good Progress towards PT goals: Progressing toward goals    Frequency  Min 3X/week    PT Plan Current plan remains appropriate    Co-evaluation             End of Session   Activity Tolerance:  Patient tolerated treatment well;Patient limited by pain Patient left: in bed;with call bell/phone within reach     Time: 1209-1218 PT Time Calculation (min) (ACUTE ONLY): 9 min  Charges:  $Gait Training: 8-22 mins                    G Codes:      [name] 01/10/2015, 12:42 PM

## 2015-01-10 NOTE — Clinical Social Work Note (Signed)
At this time patient does NOT have a bed available for IV Antibiotics due to [facility]. CSW and CSW Chiropodistassistant director are working on difficult to place placement for patient. Report left for weekend CSW.   [name] Malini Flemings MSW, [place]CSWA, [place] College[place]ASA, [phone]

## 2015-01-10 NOTE — Progress Notes (Signed)
Subjective: Patient reports her pain is controlled. Ortho came by last night to change the dressing. Patient to get PICC line today and hopefully go to SNF to finish her course of IV antibiotics (9 days).   Objective: Vital signs in last 24 hours: Filed Vitals:   01/09/15 0524 01/09/15 1417 01/09/15 2233 01/10/15 0529  BP: 128/90 134/84 133/92 126/84  Pulse: 72 78 97 78  Temp: 98.6 F (37 C) 98 F (36.7 C) 99 F (37.2 C) 98.3 F (36.8 C)  TempSrc: Oral Oral Oral Oral  Resp: 18 20 12 12   Height:      Weight:      SpO2: 99% 100% 98% 98%   Weight change:   Intake/Output Summary (Last 24 hours) at 01/10/15 1044 Last data filed at 01/10/15 0925  Gross per 24 hour  Intake    782 ml  Output   4350 ml  Net  -3568 ml   Physical Exam General: alert, sitting up in bed, appears comfortable HEENT: [place]/AT, EOMI, mucus membranes moist CV: RRR, early systolic murmur heard best at 2nd right intercostal space Pulm: CTA bilaterally, breaths non-labored Abd: BS+, soft, non-tender Ext: R extremity wrapped in ace bandage. Extremity tender to touch. She is able to move her fingers and wrist freely without pain.   Neuro: alert and oriented x 3, no focal deficits  Lab Results: Basic Metabolic Panel:  Recent Labs Lab 01/07/15 0558 01/08/15 0616 01/09/15 0554  NA 136 139 139  K 3.8 3.8 3.6  CL 110 108 106  CO2 21 20 24   GLUCOSE 130* 254* 91  BUN 5* 6 <5*  CREATININE 0.60 0.65 0.69  CALCIUM 8.0* 8.5 8.0*  MG 1.6 2.0  --   PHOS 1.1* 2.4  --    Liver Function Tests:  Recent Labs Lab 01/07/15 0558  AST 47*  ALT 42*  ALKPHOS 58  BILITOT 0.7  PROT 5.4*  ALBUMIN 2.6*   CBC:  Recent Labs Lab 01/06/15 0732  01/08/15 0616 01/09/15 0554  WBC 11.4*  < > 6.3 7.6  NEUTROABS 9.7*  --   --   --   HGB 13.7  < > 11.1* 11.0*  HCT 40.5  < > 34.0* 33.0*  MCV 84.6  < > 84.8 85.7  PLT 184  < > 170 193  < > = values in this interval not displayed.  Micro Blood cx 2/22>> Strep  pyogenes Wound cx 2/23>> abundant WBCs, rare Gram + cocci in pairs, rare GNRs, few viridans strep Gram stain 2/23>> Abundant WBCs, rare gram + cocci in pairs, GNRs Anaerobic culture 2/23>> No anaerobes isolated  Blood cx 2/24>> NGTD  Medications: I have reviewed the patient's current medications.  Assessment/Plan:  Suppurative Thrombophlebitis with S. pyogenes Bacteremia: Patient has remained afebrile and WBC count 7.6. Her pain is under control. Ortho did dressing change last night. Patient does not need to undergo TEE to assess for vegetations given TTE did not show vegetations. Repeat blood cultures have NGTD. She will need a total of 14 days of IV antibiotics to treat her bacteremia. PICC line placed this morning.  - Discharge to SNF - Ortho following, appreciate recommendations and help with this case - Dressing changes per Ortho. Will get daily dressing changes at SNF.  - Continue Ancef IV (needs 9 days total at SNF, end date: 01/19/15) - SW consulted for SNF placement - f/u wound cultures>> few strep viridans, covered by Ancef - Continue to monitor fever curve and WBC  count - Dilaudid 1 mg Q4H PRN  - Oxycodone 5-10 mg PO Q3H PRN - Continue Zanaflex for muscle spasms   Active Hepatitis C: Hep C antibody positive and RNA quant >2.7 million. 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 Patient was given Hep A (Havrix) and B (Recombivax) vaccines. She will need follow up at 1 month to get 2nd Hep B vaccine and 6 months to get both Hep A and B vaccines to complete vaccination course.  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 She is unsure of the cause, but no seizures in 3 years, per pt. She is not on medication for her seizures. - Seizure precautions  Hypertension: BP 124/84.  - Continue to monitor  H/O cardiac murmur: Early systolic murmur heard on exam today, consistent with a flow murmur. Patient states she has had murmur since  she was a child. Echo showed EF 65-70%, no wall motion abnormalities, no atrial defect or PFO present.   Anxiety: Patient takes Valium 1 mg BID, Celexa 40 mg daily, and Ambien at home.  - Continue Ambien 5 mg QHS PRN - Continue Valium 1 mg Q12H PRN - Continue Celexa 40 mg daily  Diet: Regular VTE PPx: SCDs Dispo: Discharge likely today to SNF  The patient does not have a current PCP (Pcp Not In System) and does need an [facility] hospital follow-up appointment after discharge.  The patient does not have transportation limitations that hinder transportation to clinic appointments.  .Services Needed at time of discharge: Y = Yes, Blank = No PT:   OT:   RN:   Equipment:   Other:     LOS: 4 days   Rich Number, MD 01/10/2015, 10:44 AM

## 2015-01-10 NOTE — Discharge Summary (Signed)
Name: [name] MRN: [id number] DOB: Oct 03, 1986 29 y.o. PCP: Pcp Not In System  Date of Admission: 01/06/2015  7:09 AM Date of Discharge: 01/16/2015 Attending Physician: Dr. Heide Spark  Discharge Diagnosis:  Principal Problem: Suppurative Thrombophlebitis with S. pyogenes Bacteremia  Active Problems: Active Hepatitis C Infection Seizure Disorder HTN H/o Cardiac Murmur Anxiety Insomnia   Discharge Medications:   Medication List    STOP taking these medications        gabapentin 300 MG capsule  Commonly known as:  NEURONTIN      TAKE these medications        ceFAZolin 1-5 GM-%  Commonly known as:  ANCEF  Inject 50 mLs (1 g total) into the vein every 8 (eight) hours.     citalopram 40 MG tablet  Commonly known as:  CELEXA  Take 1 tablet (40 mg total) by mouth daily.     cyclobenzaprine 5 MG tablet  Commonly known as:  FLEXERIL  Take 1 tablet (5 mg total) by mouth 3 (three) times daily as needed for muscle spasms.     diphenhydrAMINE 25 mg capsule  Commonly known as:  BENADRYL  Take 1 capsule (25 mg total) by mouth at bedtime as needed for itching or sleep.     ibuprofen 400 MG tablet  Commonly known as:  ADVIL,MOTRIN  Take 1 tablet (400 mg total) by mouth every 6 (six) hours as needed for moderate pain.     oxyCODONE 15 MG immediate release tablet  Commonly known as:  ROXICODONE  Take 1 tablet (15 mg total) by mouth every 4 (four) hours as needed for moderate pain.     pregabalin 50 MG capsule  Commonly known as:  LYRICA  Take 1 capsule (50 mg total) by mouth 3 (three) times daily.     senna 8.6 MG Tabs tablet  Commonly known as:  SENOKOT  Take 1 tablet (8.6 mg total) by mouth 2 (two) times daily as needed for mild constipation.     valACYclovir 1000 MG tablet  Commonly known as:  VALTREX  Take 1 tablet (1,000 mg total) by mouth every 8 (eight) hours.     zolpidem 5 MG tablet  Commonly known as:  AMBIEN  Take 1 tablet (5 mg total) by mouth at  bedtime as needed for sleep.        Disposition and follow-up:   [name] was discharged from [facility] in Good condition.  At the hospital follow up visit please address:  1.  Suppurative Thrombophlebitis with Bacteremia: Pt needs to complete 4 more days of IV Ancef (completed after full course on 01/19/15).   Active Hepatitis C Genotype 1a: Patient will follow up in hepatitis clinic.  Hep A and Hep B Vaccines: Patient to follow up at [facility] in 1 month for her Hep B vaccine and then at 6 months to complete Hep A and Hep B vaccines.   2.  Labs / imaging needed at time of follow-up: None  3.  Pending labs/ test needing follow-up: None  Follow-up Appointments: Follow-up Information    Follow up with Staci Righter, MD On 02/05/2015.   Specialty:  Infectious Diseases   Why:  3:30 pm   Contact information:   [address] [place] [address] [phone]       Follow up with [facility].   Why:  1-2 weeks. You will need to walk in as  patients are seen first come first serve. Doors open at 8:30 am.    Contact information:   [address] [place] [address] [phone]      Follow up with Karen Chafe, MD. Go on 01/20/2015.   Specialty:  Orthopedic Surgery   Why:  8:30 am   Contact information:   [address] [place] [address] [phone]       Discharge Instructions: Discharge Instructions    Diet - low sodium heart healthy    Complete by:  As directed      Increase activity slowly    Complete by:  As directed            Consultations:  Orthopedic surgery  Procedures Performed:  Dg Chest 2 View  01/06/2015   CLINICAL DATA:  Right-sided arm pain and swelling. IV drug use on same side  EXAM: CHEST  2 VIEW  COMPARISON:  None.  FINDINGS: Normal mediastinum and cardiac silhouette. Normal pulmonary vasculature. No evidence of  effusion, infiltrate, or pneumothorax. No acute bony abnormality.  IMPRESSION: Normal chest radiograph.   Electronically Signed   By: Genevive Bi M.D.   On: 01/06/2015 09:35   [place] Extrem Up Right Ltd  01/06/2015   CLINICAL DATA:  Focal swelling of right antecubital region.  EXAM: ULTRASOUND right UPPER EXTREMITY LIMITED  TECHNIQUE: Ultrasound examination of the upper extremity soft tissues was performed in the area of clinical concern.  COMPARISON:  None.  FINDINGS: 2.9 x 2.8 x 1.6 cm complex fluid collection is seen in soft tissues of right antecubital fossa with surrounding edema concerning for possible abscess.  IMPRESSION: 2.9 cm complex fluid collection seen in soft tissues of right antecubital fossa concerning for possible abscess.   Electronically Signed   By: Lupita Raider, M.D.   On: 01/06/2015 15:11    2D Echo:  01/06/15 Study Conclusions - Left ventricle: The cavity size was normal. Systolic function was vigorous. The estimated ejection fraction was in the range of 65% to 70%. Wall motion was normal; there were no regional wall motion abnormalities. - Atrial septum: No defect or patent foramen ovale was identified.   Admission HPI: 29yo F w/ h/o seizure d/o, HTN, and IVDU presents with fevers and pain in her R antecubital fossa. She states that the pain started 2 days ago with redness and swelling after injecting heroin. She began having fevers last night with a temp of 103F. She states that the pain and swelling in her arm have progressively worsened. She has been using heroin daily for 1 year, and states that she uses clean needles and does not share them. She states that she gets money from her father and uses that money for her drugs. She did go to rehab for her heroin use in addition to cocaine and EtOH abuse, and has not used the cocaine or EtOH since.   Hospital Course by problem list:  Suppurative Thrombophlebitis with S. pyogenes Bacteremia: Patient presented with  pain and swelling of her right antecubital fossa after injecting heroin 2 days prior to admission. She was septic on admission with temp 102.1, HR 110, RR 24 in the setting of infection. She was started on IV Vancomycin. Blood cultures grew gram positive cocci in chains and IV Ceftriaxone was added.  An US of the upper extremity was obtained which confirmed presence of an abscess. Orthopedic surgery was consulted and performed an I&D. Orthopedics found a large thrombus in the cephalic  and basilar systems in addition to a deep abscess. Blood cultures resulted in Strep pyogenes and patient was switched to IV Ancef. Wound cultures grew few Strep viridans. A TTE was performed which showed no evidence of vegetations. Patient continued to improve with antibiotic therapy, remaining afebrile and her surgical incision healing well. She was discharged on IV Ancef to be completed for a total of 14 days (end date 01/19/15). Patient was advised to quit using heroin and especially to avoid injection in her right arm given the extensive infection that had occurred.     Herpes Zoster Infection: During her hospitalization, patient reported a 2 day history of a painful and pruritic rash on her right lower back in a dermatomal distribution consistent with herpes zoster. On exam, she was noted to have a  vesicular rash on her lower back that was tender to palpation. Patient was started on Valtrex 1000 mg Q8H to completed for 7 days (end date 01/19/15- 1 dose needed that day). Lesions had scabbed and patient was safe to discharge to SNF.  Active Hepatitis C Infection: Hep C antibody positive and RNA quant >2.7 million. Genotype 1a. 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 Patient was given Hep A (Havrix) and B (Recombivax) vaccines on 01/09/15. She will need follow up at 1 month to get 2nd Hep B vaccine and 6 months to get both Hep A and B vaccines to complete vaccination course. Patient will follow up in the hepatitis  clinic for her Hep C infection.   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 She is unsure of the cause, but no seizures in 3 years, per pt. She is not on medication for her seizures. She was placed on seizure precautions and had no seizure activity during her hospitalization.   HTN: BP remained stable in 120-150s systolic. Her pain likely became elevated in the 150s secondary to pain. She was not discharged on any BP medications. She will need a BP recheck at her outpatient follow up visit.   H/o Cardiac Murmur: Patient had reported a history of cardiac murmur since she was a child. On exam, she was noted to have an early systolic murmur consistent with a flow murmur. She had an echocardiogram on 01/06/15 as noted above which showed no atrial defects or PFO.   Anxiety: Patient was continued on her home Ambien 5 mg QHS as needed, Celexa 40 mg daily, and Valium 1 mg BID. She had noted increased anxiety so her Valium was increased to 2 mg Q12H as needed. Her symptoms remained well controlled. She was discharged with Valium 2 mg BID prn and Celexa 40 mg daily.   Insomnia: Patient had reported persistent insomnia during her hospitalization. She was initially given Ambien 5 mg QHS as needed, which is what she takes at home. She noted continued insomnia and thus was given benadryl 25 mg as needed which improved her symptoms. She was discharged with Ambien 5 mg QHS prn.   Discharge Vitals:   BP 124/66 mmHg  Pulse 97  Temp(Src) 98.2 F (36.8 C) (Oral)  Resp 16  Ht 5' (1.524 m)  Wt 121 lb 7.6 oz (55.1 kg)  BMI 23.72 kg/m2  SpO2 98%  LMP 12/30/2014  Discharge Labs:  Results for orders placed or performed during the hospital encounter of 01/06/15 (from the past 24 hour(s))  Glucose, capillary     Status: Abnormal   Collection Time: 01/15/15 11:33 AM  Result Value Ref Range   Glucose-Capillary 148 (H) 70 - 99 mg/dL   Comment 1 Notify RN   Glucose, capillary      Status: Abnormal   Collection Time: 01/15/15  4:34 PM  Result Value Ref Range   Glucose-Capillary 187 (H) 70 - 99 mg/dL   Comment 1 Notify RN   Glucose, capillary     Status: Abnormal   Collection Time: 01/15/15  9:21 PM  Result Value Ref Range   Glucose-Capillary 148 (H) 70 - 99 mg/dL   Comment 1 Notify RN   Glucose, capillary     Status: None   Collection Time: 01/16/15  6:27 AM  Result Value Ref Range   Glucose-Capillary 90 70 - 99 mg/dL   Comment 1 Notify RN     Signed: Jill Alexanders, DO PGY-1 Internal Medicine Resident Pager # [phone] 01/16/2015 11:26 AM  Services Ordered on Discharge: SNF Equipment Ordered on Discharge: None

## 2015-01-10 NOTE — Care Management Note (Signed)
    Page 1 of 1   01/10/2015     11:55:18 AM CARE MANAGEMENT NOTE 01/10/2015  Patient:  [name],[name]   Account Number:  [id number]  Date Initiated:  01/10/2015  Documentation initiated by:  Letha CapeAYLOR,Perl Kerney  Subjective/Objective Assessment:   dx cellulitis,  admit- from home. hx psa.     Action/Plan:   SNF, has picc line.   Anticipated DC Date:  01/10/2015   Anticipated DC Plan:  SKILLED NURSING FACILITY  In-house referral  Clinical Social Worker      DC Planning Services  CM consult      Choice offered to / List presented to:             Status of service:  Completed, signed off Medicare Important Message given?  NO (If response is "NO", the following Medicare IM given date fields will be blank) Date Medicare IM given:   Medicare IM given by:   Date Additional Medicare IM given:   Additional Medicare IM given by:    Discharge Disposition:  SKILLED NURSING FACILITY  Per UR Regulation:  Reviewed for med. necessity/level of care/duration of stay  If discussed at Long Length of Stay Meetings, dates discussed:    Comments:  01/10/15 1154 Letha Capeeborah  Jadine Brumley RN BSN [phone] patient is for dc to snf today, NCM gave patient CHW clinic information to follow up on when she has completed her snf stay.

## 2015-01-11 LAB — GLUCOSE, CAPILLARY
GLUCOSE-CAPILLARY: 132 mg/dL — AB (ref 70–99)
GLUCOSE-CAPILLARY: 114 mg/dL — AB (ref 70–99)
Glucose-Capillary: 109 mg/dL — ABNORMAL HIGH (ref 70–99)
Glucose-Capillary: 145 mg/dL — ABNORMAL HIGH (ref 70–99)

## 2015-01-11 MED ORDER — CYCLOBENZAPRINE HCL 5 MG PO TABS
5.0000 mg | ORAL_TABLET | Freq: Every day | ORAL | Status: DC
  Administered 2015-01-11: 5 mg via ORAL
  Filled 2015-01-11 (×2): qty 1

## 2015-01-11 NOTE — Progress Notes (Signed)
Physical Therapy Wound Treatment Patient Details  Name: [name] MRN: [id number] Date of Birth: Feb 19, 1986  Today's Date: 01/11/2015 Time: 1275-1700 Time Calculation (min): 27 min  Subjective  Patient and Family Stated Goals: make this better Date of Onset: 01/07/15 Prior Treatments: s/p I&D 2/23  Pain Score: Pain Score: 7   Wound Assessment  Wound / Incision (Open or Dehisced) 01/10/15 Other (Comment) Arm Right Right antecubital fossa (Active)  Dressing Type Compression wrap;Gauze (Comment);Moist to moist 01/11/2015 11:30 AM  Dressing Changed Changed 01/11/2015 11:30 AM  Dressing Status Clean;Dry;Intact 01/11/2015 11:30 AM  Dressing Change Frequency Daily 01/11/2015 11:30 AM  Site / Wound Assessment Clean;Granulation tissue 01/11/2015 11:30 AM  % Wound base Red or Granulating 100% 01/11/2015 11:30 AM  % Wound base Yellow 0% 01/11/2015 11:30 AM  % Wound base Black 0% 01/11/2015 11:30 AM  % Wound base Other (Comment) 0% 01/11/2015 11:30 AM  Peri-wound Assessment Pink;Edema 01/11/2015 11:30 AM  Wound Length (cm) 10 cm 01/10/2015  3:00 PM  Wound Width (cm) 0.5 cm 01/10/2015  3:00 PM  Wound Depth (cm) 1.3 cm 01/10/2015  3:00 PM  Margins Unattached edges (unapproximated) 01/11/2015 11:30 AM  Closure Surface sutures 01/11/2015 11:30 AM  Drainage Amount Minimal 01/11/2015 11:30 AM  Drainage Description Serosanguineous;No odor 01/11/2015 11:30 AM  Non-staged Wound Description Not applicable 1/74/9449 67:59 AM  Treatment Hydrotherapy (Pulse lavage) 01/11/2015 11:30 AM      Hydrotherapy Pulsed lavage therapy - wound location: right antecubital fossa Pulsed Lavage with Suction (psi): 4 psi Pulsed Lavage with Suction - Normal Saline Used: 500 mL Pulsed Lavage Tip: Tip with splash shield Selective Debridement Selective Debridement - Location: right antecubital fossa Selective Debridement - Tools Used:  (Q tip)   Wound Assessment and Plan  Wound Therapy - Assess/Plan/Recommendations Wound  Therapy - Clinical Statement: Visible wound bed (between sutures) is beefy red granulation tissue. Anticipate will be ready to d/c from hydrotherapy soon.  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) Hydrotherapy Plan: Patient/family education;Pulsatile lavage with suction;Dressing change Wound Therapy - Frequency: 6X / week Wound Therapy - Current Recommendations: WOC nurse;PT;OT Wound Therapy - Follow Up Recommendations: Skilled nursing facility (current plan for IV antibiotics) Wound Plan: see above  Wound Therapy Goals- Improve the function of patient's integumentary system by progressing the wound(s) through the phases of wound healing (inflammation - proliferation - remodeling) by: Decrease Necrotic Tissue to: 0% Decrease Necrotic Tissue - Progress: Met Increase Granulation Tissue to: 100% Increase Granulation Tissue - Progress: Met Improve Drainage Characteristics: Min Improve Drainage Characteristics - Progress: Met Patient/Family will be able to : verbalize/direct caregiver in dressing change to Rt elbow Patient/Family Instruction Goal - Progress: Goal set today Goals/treatment plan/discharge plan were made with and agreed upon by patient/family: Yes Time For Goal Achievement: 6 days Wound Therapy - Potential for Goals: Good  Goals will be updated until maximal potential achieved or discharge criteria met.  Discharge criteria: when goals achieved, discharge from hospital, MD decision/surgical intervention, no progress towards goals, refusal/missing three consecutive treatments without notification or medical reason.  GP     Ej Pinson 01/11/2015, 11:38 AM Pager [phone]

## 2015-01-11 NOTE — Progress Notes (Signed)
Subjective: Patient reports her pain has improved overall. She still notes muscle spasms in her arm. She is able to move her hand/fingers/wrist freely and without pain. She is tolerating PO intake without nausea/vomiting. Awaiting SNF placement to complete antibiotics.   Objective: Vital signs in last 24 hours: Filed Vitals:   01/10/15 0529 01/10/15 1405 01/10/15 2207 01/11/15 0521  BP: 126/84 153/92 144/73 128/70  Pulse: 78 71 82 70  Temp: 98.3 F (36.8 C) 98.2 F (36.8 C) 99.6 F (37.6 C) 98.5 F (36.9 C)  TempSrc: Oral Axillary Oral Oral  Resp: Height:      Weight:      SpO2: 98% 100% 98% 97%   Weight change:   Intake/Output Summary (Last 24 hours) at 01/11/15 1002 Last data filed at 01/11/15 0844  Gross per 24 hour  Intake   1490 ml  Output   2450 ml  Net   -960 ml   Physical Exam General: alert, sitting up in bed, appears comfortable HEENT: [place]/AT, EOMI, mucus membranes moist CV: RRR, early systolic murmur heard best at 2nd right intercostal space Pulm: CTA bilaterally, breaths non-labored Abd: BS+, soft, non-tender Ext: R upper extremity with 8 inch incision, appears clean/dry/intact. Margins look great. Minimal serous fluid drainage, but no pus.  Neuro: alert and oriented x 3, no focal deficits  Lab Results: Basic Metabolic Panel:  Recent Labs Lab 01/07/15 0558 01/08/15 0616 01/09/15 0554  NA 136 139 139  K 3.8 3.8 3.6  CL 110 108 106  CO2 GLUCOSE 130* 254* 91  BUN 5* 6 <5*  CREATININE 0.60 0.65 0.69  CALCIUM 8.0* 8.5 8.0*  MG 1.6 2.0  --   PHOS 1.1* 2.4  --    Liver Function Tests:  Recent Labs Lab 01/07/15 0558  AST 47*  ALT 42*  ALKPHOS 58  BILITOT 0.7  PROT 5.4*  ALBUMIN 2.6*   CBC:  Recent Labs Lab 01/06/15 0732  01/08/15 0616 01/09/15 0554  WBC 11.4*  < > 6.3 7.6  NEUTROABS 9.7*  --   --   --   HGB 13.7  < > 11.1* 11.0*  HCT 40.5  < > 34.0* 33.0*  MCV 84.6  < > 84.8 85.7  PLT 184  < > 170 193  < >  = values in this interval not displayed.  CBG:  Recent Labs Lab 01/09/15 2235 01/10/15 0823 01/10/15 1210 01/10/15 1705 01/10/15 2204 01/11/15 0832  GLUCAP 106* 131* 108* 127* 132* 145*   Micro Blood cx 2/22>> Strep pyogenes Wound cx 2/23>> few strep viridans  Gram stain 2/23>> Abundant WBCs, rare gram + cocci in pairs, rare GNRs Anaerobic culture 2/23>> No anaerobes isolated  Blood cx 2/24>> NGTD  Medications: I have reviewed the patient's current medications.  Assessment/Plan:  Suppurative Thrombophlebitis with S. pyogenes Bacteremia: Patient has remained afebrile and WBC count 7.6. Her pain is under control. Repeat blood cultures have NGTD. Wound cultures with few strep viridans, covered by Ancef. She will need a total of 14 days of IV antibiotics to treat her bacteremia. PICC line placed yesterday.  - Discharge to SNF when bed available  - Ortho following, appreciate recommendations and help with this case - Dressing changes per Ortho. Will get daily dressing changes at SNF.  - Continue Ancef IV (needs 8 days total at SNF, end date: 01/19/15) - SW consulted for SNF placement, working on difficult placement  - Continue to monitor fever  curve and WBC count - Dilaudid 1 mg Q4H PRN  - Oxycodone 5-10 mg PO Q3H PRN - Switch to Flexeril for muscle spasms   Active Hepatitis C: Hep C antibody positive and RNA quant >2.7 million. 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 Patient was given Hep A (Havrix) and B (Recombivax) vaccines. She will need follow up at 1 month to get 2nd Hep B vaccine and 6 months to get both Hep A and B vaccines to complete vaccination course.  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 She is unsure of the cause, but no seizures in 3 years, per pt. She is not on medication for her seizures. - Seizure precautions  Hypertension: BP 128/70.  - Continue to monitor  H/O cardiac murmur: Early systolic murmur  heard on exam today, consistent with a flow murmur. Patient states she has had murmur since she was a child. Echo showed EF 65-70%, no wall motion abnormalities, no atrial defect or PFO present.   Anxiety: Patient takes Valium 1 mg BID, Celexa 40 mg daily, and Ambien at home.  - Continue Ambien 5 mg QHS PRN - Continue Valium 1 mg Q12H PRN - Continue Celexa 40 mg daily  Diet: Regular  VTE PPx: SCDs Dispo: Awaiting SNF placement   The patient does not have a current PCP (Pcp Not In System) and [facility].  The patient does not have transportation limitations that hinder transportation to clinic appointments.  .Services Needed at time of discharge: Y = Yes, Blank = No PT:   OT:   RN:   Equipment:   Other:     LOS: 5 days   Rich Numberarly Rivet, MD 01/11/2015, 10:02 AM

## 2015-01-11 NOTE — Progress Notes (Signed)
Occupational Therapy Treatment Patient Details Name: Jamie FrederickKrystal D Camacho MRN: [id number] DOB: [birth year] Today's Date: 01/11/2015    History of present illness [name] is a 29 year old woman with a history of intravenous heroin abuse, cocaine abuse, marijuana abuse, hypertension, and seizure disorder who presents with a 2 day history of fevers and pain in the right antecubital fossa. The pain and swelling began after injecting herself in that area with heroin. As the fevers climb to 103 at home and the pain progressively worsened she presented to the emergency department for further evaluation and was admitted to the internal medicine teaching service for a right antecubital cellulitis and possible abscess. Pt s/p  Irrigation and debridement of deep abscess, antecubital fossa, Excision of occluded venous system including the cephalic vein. Fasciotomy upper arm and forearm secondary to impending compartment    OT comments  Pt. Tolerated R UE rom and exercises.  Able to return demo with instruction for technique.  Reviewed pillow placement for proper elevation as pt. Initially had wrist dangling upon arrival into room.    Follow Up Recommendations       Equipment Recommendations  None recommended by OT    Recommendations for Other Services      Precautions / Restrictions Precautions Precautions: Fall Required Braces or Orthoses: Sling Restrictions Other Position/Activity Restrictions: elevation of R UE       Mobility Bed Mobility                  Transfers                      Balance                                   ADL                                                Vision                     Perception     Praxis      Cognition   Behavior During Therapy: Flat affect Overall Cognitive Status: Within Functional Limits for tasks assessed                       Extremity/Trunk Assessment                Exercises Other Exercises Other Exercises: shoulder FF; ER abduction (horizontal). educated on importance of moving through ROM to prevent tightness in shoulder Other Exercises: composite flexion/extension Other Exercises: squeeze ball   Shoulder Instructions       General Comments      Pertinent Vitals/ Pain       Pain Assessment:  (did not rate but states she has requested meds prior to my arrival)  Home Living                                          Prior Functioning/Environment              Frequency Min 3X/week     Progress Toward Goals  OT Goals(current goals can now be found in the  care plan section)  Progress towards OT goals: Progressing toward goals     Plan      Co-evaluation                 End of Session     Activity Tolerance Patient tolerated treatment well   Patient Left in bed;with call bell/phone within reach   Nurse Communication          Time: 1610-9604 OT Time Calculation (min): 10 min  Charges: OT General Charges $OT Visit: 1 Procedure OT Treatments $Therapeutic Exercise: 8-22 mins  Robet Leu, COTA/L 01/11/2015, 10:45 AM

## 2015-01-11 NOTE — Social Work (Signed)
CSW followed up on bed offers for patient. There are no bed offers available for the patient.  CSW will continue to follow for appropriate placement and disposition.  Beverly Sessionsywan J Virgil Slinger MSW, LCSW [phone]

## 2015-01-11 NOTE — Progress Notes (Signed)
Subjective: 4 Days Post-Op Procedure(s) (LRB): IRRIGATION AND DEBRIDEMENT EXTREMITY (Right) Patient reports pain as improved overall. She denies fevers, chills or nausea this juncture. SHe feels as though the arm is improving overall. She is tolerating a regular diet without difficulties, she is voiding, and having bowel movements without difficulties. Currently awaiting short-term nursing facility for continuation of IV antibiotics.  Objective: Vital signs in last 24 hours: Temp:  [98.2 F (36.8 C)-99.6 F (37.6 C)] 98.5 F (36.9 C) (02/27 0521) Pulse Rate:  [70-82] 70 (02/27 0521) Resp:  [18-20] 20 (02/27 0521) BP: (128-153)/(70-92) 128/70 mmHg (02/27 0521) SpO2:  [97 %-100 %] 97 % (02/27 0521)  Intake/Output from previous day: 02/26 0701 - 02/27 0700 In: 1950 [P.O.:1800; IV Piggyback:150] Out: 2850 [Urine:2850] Intake/Output this shift: Total I/O In: -  Out: 200 [Urine:200]   Recent Labs  01/09/15 0554  HGB 11.0*    Recent Labs  01/09/15 0554  WBC 7.6  RBC 3.85*  HCT 33.0*  PLT 193    Recent Labs  01/09/15 0554  NA 139  K 3.6  CL 106  CO2 24  BUN <5*  CREATININE 0.69  GLUCOSE 91  CALCIUM 8.0*   No results for input(s): LABPT, INR in the last 72 hours. Cultures positive for strep pyogenes and strep viridans She is pleasant in no acute distress, conversant.  Focused examination of the right upper extremity reveals that once her dressings are removed she has had a marked improvement of the erythema and cellulitic changes. The incision is intact she has mild serous drainage present about the dressings however there is no gross purulence present. The margins look excellent. There is healthy granulation tissue present.   Assessment/Plan: 4 Days Post-Op Procedure(s) (LRB): IRRIGATION AND DEBRIDEMENT EXTREMITY (Right) Continue IV antibiotics and daily wound care. PT to continue daily wound care with pulse lavage to the open sites followed by wet to dry  dressing changes. Therapy to teach wet to dry dressing changes for home purposes. We will need to see patient in our office next week, but will continue to follow while the patient is inpatient. Current plans are to discharge to a short-term nursing facility tentatively early next week for continuation of IV antibiotics.  Destani Wamser L 01/11/2015, 9:40 AM

## 2015-01-12 DIAGNOSIS — G4700 Insomnia, unspecified: Secondary | ICD-10-CM

## 2015-01-12 DIAGNOSIS — B029 Zoster without complications: Secondary | ICD-10-CM

## 2015-01-12 LAB — GLUCOSE, CAPILLARY
GLUCOSE-CAPILLARY: 106 mg/dL — AB (ref 70–99)
Glucose-Capillary: 111 mg/dL — ABNORMAL HIGH (ref 70–99)
Glucose-Capillary: 123 mg/dL — ABNORMAL HIGH (ref 70–99)
Glucose-Capillary: 118 mg/dL — ABNORMAL HIGH (ref 70–99)

## 2015-01-12 LAB — ANAEROBIC CULTURE

## 2015-01-12 MED ORDER — DIPHENHYDRAMINE HCL 25 MG PO CAPS
25.0000 mg | ORAL_CAPSULE | Freq: Four times a day (QID) | ORAL | Status: DC | PRN
  Administered 2015-01-12 – 2015-01-15 (×4): 25 mg via ORAL
  Filled 2015-01-12 (×4): qty 1

## 2015-01-12 MED ORDER — VALACYCLOVIR HCL 500 MG PO TABS
1000.0000 mg | ORAL_TABLET | Freq: Three times a day (TID) | ORAL | Status: DC
  Administered 2015-01-12 – 2015-01-16 (×12): 1000 mg via ORAL
  Filled 2015-01-12 (×12): qty 2

## 2015-01-12 MED ORDER — ZOLPIDEM TARTRATE 5 MG PO TABS
5.0000 mg | ORAL_TABLET | Freq: Every evening | ORAL | Status: DC | PRN
  Administered 2015-01-12 – 2015-01-15 (×4): 5 mg via ORAL
  Filled 2015-01-12 (×4): qty 1

## 2015-01-12 MED ORDER — PREGABALIN 50 MG PO CAPS
50.0000 mg | ORAL_CAPSULE | Freq: Three times a day (TID) | ORAL | Status: DC
  Administered 2015-01-12 – 2015-01-16 (×12): 50 mg via ORAL
  Filled 2015-01-12 (×6): qty 1
  Filled 2015-01-12: qty 2
  Filled 2015-01-12 (×5): qty 1

## 2015-01-12 MED ORDER — CYCLOBENZAPRINE HCL 10 MG PO TABS
5.0000 mg | ORAL_TABLET | Freq: Three times a day (TID) | ORAL | Status: DC | PRN
  Administered 2015-01-12 – 2015-01-16 (×11): 5 mg via ORAL
  Filled 2015-01-12 (×11): qty 1

## 2015-01-12 MED ORDER — DIAZEPAM 2 MG PO TABS
2.0000 mg | ORAL_TABLET | Freq: Two times a day (BID) | ORAL | Status: DC | PRN
  Administered 2015-01-12 – 2015-01-16 (×8): 2 mg via ORAL
  Filled 2015-01-12 (×8): qty 1

## 2015-01-12 NOTE — Progress Notes (Signed)
Subjective:  Pt seen and examined in AM. No acute events overnight. She continues to have right arm pain that is moderately controlled with dilaudid and oxycodone. She reports flexiril helped with muscle spasms. She still has insomnia and feels anxious. She has had a painful rash with itching on the right side of her lower back for the past few days but has not mentioned to anyone until today.     Objective: Vital signs in last 24 hours: Filed Vitals:   01/11/15 0521 01/11/15 1351 01/11/15 2144 01/12/15 0509  BP: 128/70 139/78 143/87 139/79  Pulse: 70 74  73  Temp: 98.5 F (36.9 C) 97.6 F (36.4 C) 99 F (37.2 C) 98.6 F (37 C)  TempSrc: Oral Oral Oral Oral  Resp: 20 19 15 18   Height:      Weight:      SpO2: 97% 95% 96% 99%   Weight change:   Intake/Output Summary (Last 24 hours) at 01/12/15 1004 Last data filed at 01/12/15 0850  Gross per 24 hour  Intake    560 ml  Output   2800 ml  Net  -2240 ml   Physical Exam General: alert, sitting up in bed, appears comfortable HEENT: [place]/AT, EOMI, mucus membranes moist CV: RRR, early systolic murmur heard best at 2nd right intercostal space Pulm: CTA bilaterally, breaths non-labored Abd: BS+, soft, non-tender Ext: R upper extremity with CDI dressing.   Neuro: alert and oriented x 3, no focal deficits Skin: Vesicular rash on right lower back in dermatome distribution that is tender to palpation   Lab Results: Basic Metabolic Panel:  Recent Labs Lab 01/07/15 0558 01/08/15 0616 01/09/15 0554  NA 136 139 139  K 3.8 3.8 3.6  CL 110 108 106  CO2 21 20 24   GLUCOSE 130* 254* 91  BUN 5* 6 <5*  CREATININE 0.60 0.65 0.69  CALCIUM 8.0* 8.5 8.0*  MG 1.6 2.0  --   PHOS 1.1* 2.4  --    Liver Function Tests:  Recent Labs Lab 01/07/15 0558  AST 47*  ALT 42*  ALKPHOS 58  BILITOT 0.7  PROT 5.4*  ALBUMIN 2.6*   CBC:  Recent Labs Lab 01/06/15 0732  01/08/15 0616 01/09/15 0554  WBC 11.4*  < > 6.3 7.6  NEUTROABS 9.7*   --   --   --   HGB 13.7  < > 11.1* 11.0*  HCT 40.5  < > 34.0* 33.0*  MCV 84.6  < > 84.8 85.7  PLT 184  < > 170 193  < > = values in this interval not displayed.  CBG:  Recent Labs Lab 01/10/15 2204 01/11/15 0832 01/11/15 1158 01/11/15 1702 01/11/15 2335 01/12/15 0803  GLUCAP 132* 145* 132* 114* 109* 111*   Micro Blood cx 2/22>> Strep pyogenes Wound cx 2/23>> few strep viridans  Gram stain 2/23>> Abundant WBCs, rare gram + cocci in pairs, rare GNRs Anaerobic culture 2/23>> No anaerobes isolated  Blood cx 2/24>> NGTD  Medications: I have reviewed the patient's current medications.  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  -Airborne and contact precautions -Start valtrex 1000 mg Q 8 hr for 7 days  -Change gabapentin 300 mg TID to lyrica 50 mg TID  -Start benadryl 25 mg Q 6 hr PRN itching    Suppurative Thrombophlebitis with S. pyogenes Bacteremia: Patient has remained afebrile  with no leukocytosis.  Her pain is under control. Repeat blood cultures have NGTD. Wound cultures with few strep viridans, covered by Ancef. She will need a total of 14 days of IV antibiotics to treat her bacteremia. PICC line placed yesterday.  - Discharge to SNF when bed available  - Ortho following, appreciate recommendations and help with this case - Dressing changes per Ortho. Will get daily dressing changes at SNF.  - Continue Ancef IV (needs 7 days total at SNF, end date: 01/19/15) - SW consulted for SNF placement, working on difficult placement  - Continue to monitor fever curve and WBC count - Dilaudid 1 mg Q4H PRN  - Oxycodone 5-10 mg PO Q3H PRN - Increase flexeril from 5 mg daily to TID PRN muscle spasms  -Follow-up repeat blood cultures x 2 ----> NGTD  Active Hepatitis C: Hep C antibody positive and RNA quant >2.7  million. 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 Patient was given Hep A (Havrix) and B (Recombivax) vaccines. She will need follow up at 1 month to get 2nd Hep B vaccine and 6 months to get both Hep A and B vaccines to complete vaccination course.  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 She is unsure of the cause, but no seizures in 3 years, per pt. She is not on medication for her seizures. - Seizure precautions  Hypertension: BP 128/70.  - Continue to monitor  H/O cardiac murmur: Early systolic murmur heard on exam today, consistent with a flow murmur. Patient states she has had murmur since she was a child. Echo showed EF 65-70%, no wall motion abnormalities, no atrial defect or PFO present.   Anxiety: Patient takes Valium 1 mg BID, Celexa 40 mg daily, and Ambien at home.  - Continue Ambien 5 mg QHS PRN  - Increase Valium 1 mg to 2 mg Q12H PRN - Continue Celexa 40 mg daily  Insomnia: Reports persistent insomnia -Continue Ambien 5 mg QHS PRN  -Start benadryl 25 mg PRN    Diet: Regular  VTE PPx: SCDs Dispo: Awaiting SNF placement   The patient does not have a current PCP (Pcp Not In System) and does need an [facility] hospital follow-up appointment after discharge.  The patient does not have transportation limitations that hinder transportation to clinic appointments.  .Services Needed at time of discharge: Y = Yes, Blank = No PT:   OT:   RN:   Equipment:   Other:     LOS: 6 days   Otis Brace, MD 01/12/2015, 10:04 AM

## 2015-01-13 DIAGNOSIS — G40909 Epilepsy, unspecified, not intractable, without status epilepticus: Secondary | ICD-10-CM

## 2015-01-13 LAB — GLUCOSE, CAPILLARY
GLUCOSE-CAPILLARY: 134 mg/dL — AB (ref 70–99)
GLUCOSE-CAPILLARY: 114 mg/dL — AB (ref 70–99)
Glucose-Capillary: 86 mg/dL (ref 70–99)
Glucose-Capillary: 104 mg/dL — ABNORMAL HIGH (ref 70–99)

## 2015-01-13 NOTE — Progress Notes (Signed)
Patient ID: [name], female   DOB: 08/15/1986, 29 y.o.   MRN: [id number] Patient is seen and examined at bedside  She is alert and oriented.  She complains of pain however I have informed her  that her pain receptors are not typical.  I would expect her to be pain focused given her lengthy history of drug abuse. She understands this. I discussed with her cognitive feedback and other measures to try and afford her improved pain control  I discussed with her that fortunately her arm is looking much better.  Procedure I took down her dressing and reviewed this at great length. She has no your edema over her wound. She is neurovascularly intact and has no signs of compartment syndrome, necrotizing fasciitis, or any abscess or deep infection. I feel the surgical treatment has helped her and she concurs  Cultures are noted in the chart.  She has a little bit of swelling still present in the upper arm. I feel some of this is due to poor elevation and simply residual infection as her process was quite advance before surgical and antibiotic treatment  I discussed with her at great length that she should never use this arm for IV drug access again due to its incompetent venous return and would recommend strain away from blood pressure measurements on this arm etc.  I discussed with patient the plans going forward.  I would recommend daily hydrotherapy and ultimately having the patient learned wet-to-dry dressing changes herself.  I reviewed her chart. I am aware of the medical issues and concerns as well as treatment going forward   We will be monitoring her while she is in the hospital however I feel her condition is stabilized from a upper extremity surgical standpoint   We will need to remove her sutures in 12 days or so  Oletta CohnWilliam Shravya Wickwire M.D.

## 2015-01-13 NOTE — Clinical Social Work Note (Signed)
Per CSW ChiropodistAssistant Director, patient has bed available on 01/14/2015 at [facility] if patient medically stable for discharge.  Patient updated regarding SNF bed availability.   Marcelline Deistmily Raelynn Corron, LCSWA [phone]) Licensed Clinical Social Worker Orthopedics [phone]([id number]) and Surgical [phone]([id number])

## 2015-01-13 NOTE — Progress Notes (Signed)
Subjective: No acute events overnight. Patient reports she is still having right arm pain but is controlled with dilaudid. Her shingles on her lower back does not seem to be bothering her too much with lyrica for pain.   Objective: Vital signs in last 24 hours: Filed Vitals:   01/12/15 0509 01/12/15 1601 01/12/15 1810 01/13/15 0518  BP: 139/79 133/72 156/89 121/67  Pulse: 73 79 78 80  Temp: 98.6 F (37 C) 98.9 F (37.2 C) 98.9 F (37.2 C) 98.6 F (37 C)  TempSrc: Oral Oral Oral Oral  Resp: 18 16 16 16   Height:      Weight:      SpO2: 99% 98% 97% 98%   Weight change:   Intake/Output Summary (Last 24 hours) at 01/13/15 0850 Last data filed at 01/13/15 0519  Gross per 24 hour  Intake   2310 ml  Output    950 ml  Net   1360 ml   Physical Exam General: alert, sitting up in bed, appears comfortable HEENT: [place]/AT, EOMI, mucus membranes moist CV: RRR, early systolic murmur heard best at 2nd right intercostal space Pulm: CTA bilaterally, breaths non-labored Abd: BS+, soft, non-tender Ext: R upper extremity with CDI dressing/ace bandage.  Neuro: alert and oriented x 3, no focal deficits Skin: Vesicular rash on right lower back in dermatome distribution (L5 or S1 distribution) that is tender to palpation   Lab Results: Basic Metabolic Panel:  Recent Labs Lab 01/07/15 0558 01/08/15 0616 01/09/15 0554  NA 136 139 139  K 3.8 3.8 3.6  CL 110 108 106  CO2 21 20 24   GLUCOSE 130* 254* 91  BUN 5* 6 <5*  CREATININE 0.60 0.65 0.69  CALCIUM 8.0* 8.5 8.0*  MG 1.6 2.0  --   PHOS 1.1* 2.4  --    CBC:  Recent Labs Lab 01/08/15 0616 01/09/15 0554  WBC 6.3 7.6  HGB 11.1* 11.0*  HCT 34.0* 33.0*  MCV 84.8 85.7  PLT 170 193   Micro: Blood cx 2/22>> Strep pyogenes  Wound cx 2/23>> few strep viridans  Gram stain 2/23>> Abundant WBCs, rare gram + cocci in pairs, rare GNRs  Anaerobic culture 2/23>> No anaerobes isolated  Blood cx 2/24>> NGTD  Medications: I have  reviewed the patient's current medications.  Assessment/Plan:  Herpes Zoster Infection - Pt is non-immunocompromised with 2 day history of vesicular painful and pruritic rash on right lower back in dermatomal distribution (L5 or S1, difficult to assess) consistent with herpes zoster.  - Airborne and contact precautions - Continue valtrex 1000 mg Q 8 hr for 7 days (end date 01/19/15- will need 1 dose that day) - Continue lyrica 50 mg TID  - Continue benadryl 25 mg Q 6 hr PRN itching   Suppurative Thrombophlebitis with S. pyogenes Bacteremia: Patient has remained afebrile with no leukocytosis. Her pain is under control. Repeat blood cultures have NGTD. Wound cultures with few strep viridans, covered by Ancef. She will need a total of 14 days of IV antibiotics to treat her bacteremia. PICC line in placed 2/27.  - Discharge to SNF when bed available. Likely will end up completing antibiotics here given her Shingles infection.   - Ortho following, appreciate recommendations and help with this case - Dressing changes per Ortho. Will need stitches removed in 12 days, per ortho.  - Continue Ancef IV (needs 6 more days total, end date: 01/19/15) - SW consulted for SNF placement, working on difficult placement  - Continue to monitor  fever curve and WBC count - Dilaudid 1 mg Q4H PRN  - Oxycodone 5-10 mg PO Q3H PRN - Continue flexeril 5 mg TID PRN muscle spasms  - Follow-up repeat blood cultures x 2 ----> NGTD  Active Hepatitis C: Hep C antibody positive and RNA quant >2.7 million. 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 Patient was given Hep A (Havrix) and B (Recombivax) vaccines. She will need follow up at 1 month to get 2nd Hep B vaccine and 6 months to get both Hep A and B vaccines to complete vaccination course. 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 She is unsure of  the cause, but no seizures in 3 years, per pt. She is not on medication for her seizures. - Seizure precautions  Hypertension: BP 121/67.  - Continue to monitor  H/O cardiac murmur: Early systolic murmur heard on exam today, consistent with a flow murmur. Patient states she has had murmur since she was a child. Echo showed EF 65-70%, no wall motion abnormalities, no atrial defect or PFO present.   Anxiety: Patient takes Valium 1 mg BID, Celexa 40 mg daily, and Ambien at home.  - Continue Ambien 5 mg QHS PRN  - Continue Valium 2 mg Q12H PRN - Continue Celexa 40 mg daily  Insomnia: Reports persistent insomnia - Continue Ambien 5 mg QHS PRN  - Continue Benadryl 25 mg PRN   Diet: Regular VTE PPx: SCDs Dispo: Disposition is deferred at this time, awaiting improvement of current medical problems.  Anticipated discharge in approximately 6 days to complete IV antibiotics.   The patient does not have a current PCP (Pcp Not In System) and does need an [facility] hospital follow-up appointment after discharge.  The patient does not have transportation limitations that hinder transportation to clinic appointments.  .Services Needed at time of discharge: Y = Yes, Blank = No PT:   OT:   RN:   Equipment:   Other:     LOS: 7 days   Rich Number, MD 01/13/2015, 8:50 AM

## 2015-01-13 NOTE — Progress Notes (Signed)
Physical Therapy Wound Treatment Patient Details  Name: [name] MRN: [id number] Date of Birth: 1986/06/14  Today's Date: 01/13/2015 Time: 6314-9702 Time Calculation (min): 31 min  Subjective  Patient and Family Stated Goals: make this better Date of Onset: 01/07/15 Prior Treatments: s/p I&D 2/23  Pain Score: Pain Score: 7   Wound Assessment  Wound / Incision (Open or Dehisced) 01/06/15 Other (Comment) Arm Left cellulitis, circled with marker in ED  (Active)  Dressing Type Other (Comment) 01/06/2015  5:26 PM  Drainage Amount None 01/06/2015  5:26 PM     Wound / Incision (Open or Dehisced) 01/10/15 Other (Comment) Arm Right Right antecubital fossa (Active)  Dressing Type Compression wrap;Gauze (Comment);Moist to moist 01/13/2015  2:14 PM  Dressing Changed New 01/13/2015  2:14 PM  Dressing Status Clean;Dry;Intact 01/13/2015  2:14 PM  Dressing Change Frequency Daily 01/13/2015  2:14 PM  Site / Wound Assessment Clean;Granulation tissue 01/13/2015  2:14 PM  % Wound base Red or Granulating 100% 01/13/2015  2:14 PM  % Wound base Yellow 0% 01/13/2015  2:14 PM  % Wound base Black 0% 01/13/2015  2:14 PM  % Wound base Other (Comment) 0% 01/13/2015  2:14 PM  Peri-wound Assessment Pink;Edema 01/13/2015  2:14 PM  Wound Length (cm) 10 cm 01/10/2015  3:00 PM  Wound Width (cm) 0.5 cm 01/10/2015  3:00 PM  Wound Depth (cm) 1.3 cm 01/10/2015  3:00 PM  Margins Unattached edges (unapproximated) 01/13/2015  2:14 PM  Closure Surface sutures 01/13/2015  2:14 PM  Drainage Amount Scant 01/13/2015  2:14 PM  Drainage Description Serosanguineous;No odor 01/13/2015  2:14 PM  Non-staged Wound Description Not applicable 6/37/8588  5:02 PM  Treatment Cleansed;Debridement (Selective);Hydrotherapy (Pulse lavage) 01/13/2015  2:14 PM     Incision (Closed) 01/07/15 Arm Right (Active)  Dressing Type Compression wrap 01/13/2015  9:00 AM  Dressing Dry;Clean;Intact 01/13/2015  9:00 AM  Dressing Change Frequency Daily 01/11/2015   7:46 PM  Site / Wound Assessment Dressing in place / Unable to assess 01/13/2015  9:00 AM  Drainage Amount None 01/13/2015  9:00 AM   Hydrotherapy Pulsed lavage therapy - wound location: right antecubital fossa Pulsed Lavage with Suction (psi): 4 psi Pulsed Lavage with Suction - Normal Saline Used: 1000 mL Pulsed Lavage Tip: Tip with splash shield Selective Debridement Selective Debridement - Location: right antecubital fossa Selective Debridement - Tools Used:  (Q tip)   Wound Assessment and Plan  Wound Therapy - Assess/Plan/Recommendations Wound Therapy - Clinical Statement: Visible wound bed (between sutures) is beefy red granulation tissue. Anticipate will be ready to d/c from hydrotherapy soon.  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) Hydrotherapy Plan: Patient/family education;Pulsatile lavage with suction;Dressing change Wound Therapy - Frequency: 6X / week Wound Therapy - Current Recommendations: WOC nurse;PT;OT Wound Therapy - Follow Up Recommendations: Skilled nursing facility (current plan for IV antibiotics) Wound Plan: see above  Wound Therapy Goals- Improve the function of patient's integumentary system by progressing the wound(s) through the phases of wound healing (inflammation - proliferation - remodeling) by: Decrease Necrotic Tissue to: 0% Decrease Necrotic Tissue - Progress: Met Increase Granulation Tissue to: 100% Increase Granulation Tissue - Progress: Met Improve Drainage Characteristics: Min Improve Drainage Characteristics - Progress: Met Patient/Family will be able to : verbalize/direct caregiver in dressing change to Rt elbow Patient/Family Instruction Goal - Progress: Progressing toward goal Goals/treatment plan/discharge plan were made with and  agreed upon by patient/family: Yes Time For Goal  Achievement: 6 days Wound Therapy - Potential for Goals: Good  Goals will be updated until maximal potential achieved or discharge criteria met.  Discharge criteria: when goals achieved, discharge from hospital, MD decision/surgical intervention, no progress towards goals, refusal/missing three consecutive treatments without notification or medical reason.  GP     Candy Sledge A 01/13/2015, 2:17 PM Candy Sledge, [place], DPT [phone]

## 2015-01-14 LAB — GLUCOSE, CAPILLARY
Glucose-Capillary: 85 mg/dL (ref 70–99)
Glucose-Capillary: 134 mg/dL — ABNORMAL HIGH (ref 70–99)
Glucose-Capillary: 153 mg/dL — ABNORMAL HIGH (ref 70–99)
Glucose-Capillary: 141 mg/dL — ABNORMAL HIGH (ref 70–99)

## 2015-01-14 LAB — CULTURE, BLOOD (ROUTINE X 2)
Culture: NO GROWTH
Culture: NO GROWTH

## 2015-01-14 MED ORDER — ENSURE COMPLETE PO LIQD
237.0000 mL | Freq: Two times a day (BID) | ORAL | Status: DC
  Administered 2015-01-14 – 2015-01-15 (×4): 237 mL via ORAL

## 2015-01-14 NOTE — Progress Notes (Signed)
Physical Therapy Wound Treatment Patient Details  Name: [name] MRN: [id number] Date of Birth: [birth year]  Today's Date: 01/14/2015 Time: 1150-1207 Time Calculation (min): 17 min  Subjective  Subjective: go ahead and do it Patient and Family Stated Goals: make this better Date of Onset: 01/07/15 Prior Treatments: s/p I&D 2/23  Pain Score:  Minimal pain reported today during dressing change.  Wound Assessment  Wound / Incision (Open or Dehisced) 01/06/15 Other (Comment) Arm Left cellulitis, circled with marker in ED  (Active)  Dressing Type Other (Comment) 01/06/2015  5:26 PM  Drainage Amount None 01/06/2015  5:26 PM     Wound / Incision (Open or Dehisced) 01/10/15 Other (Comment) Arm Right Right antecubital fossa (Active)  Dressing Type Compression wrap;Gauze (Comment);Moist to moist 01/14/2015 12:09 PM  Dressing Changed New 01/14/2015 12:09 PM  Dressing Status Clean;Dry;Intact 01/14/2015 12:09 PM  Dressing Change Frequency Daily 01/14/2015 12:09 PM  Site / Wound Assessment Clean;Granulation tissue 01/14/2015 12:09 PM  % Wound base Red or Granulating 100% 01/14/2015 12:09 PM  % Wound base Yellow 0% 01/14/2015 12:09 PM  % Wound base Black 0% 01/14/2015 12:09 PM  % Wound base Other (Comment) 0% 01/14/2015 12:09 PM  Peri-wound Assessment Pink 01/14/2015 12:09 PM  Wound Length (cm) 10 cm 01/10/2015  3:00 PM  Wound Width (cm) 0.5 cm 01/10/2015  3:00 PM  Wound Depth (cm) 1.3 cm 01/10/2015  3:00 PM  Margins Unattached edges (unapproximated) 01/14/2015 12:09 PM  Closure Surface sutures 01/14/2015 12:09 PM  Drainage Amount Scant 01/14/2015 12:09 PM  Drainage Description Serosanguineous;No odor 01/14/2015 12:09 PM  Non-staged Wound Description Not applicable 0/0/7121 97:58 PM  Treatment Cleansed;Debridement (Selective);Hydrotherapy (Pulse lavage) 01/14/2015 12:09 PM     Incision (Closed) 01/07/15 Arm Right (Active)  Dressing Type Compression wrap 01/13/2015  8:00 PM  Dressing Clean;Dry;Intact 01/13/2015  8:00 PM   Dressing Change Frequency Daily 01/11/2015  7:46 PM  Site / Wound Assessment Dressing in place / Unable to assess 01/13/2015  8:00 PM  Drainage Amount None 01/13/2015  8:00 PM   Hydrotherapy Pulsed lavage therapy - wound location: right antecubital fossa Pulsed Lavage with Suction (psi): 4 psi Pulsed Lavage with Suction - Normal Saline Used: 500 mL Pulsed Lavage Tip: Tip with splash shield Selective Debridement Selective Debridement - Location: right antecubital fossa   Wound Assessment and Plan  Wound Therapy - Assess/Plan/Recommendations Wound Therapy - Clinical Statement: Visible wound bed (between sutures) is beefy red granulation tissue. Hydrotherapy is no longer indicated due to healthy wound bed. Education provided on how to perform dressing changes at home. All questions answered. Will sign off for now. Please re-consult if warranted.  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) Hydrotherapy Plan: Patient/family education;Pulsatile lavage with suction;Dressing change Wound Therapy - Frequency: 6X / week Wound Therapy - Current Recommendations: WOC nurse;PT;OT Wound Therapy - Follow Up Recommendations: Skilled nursing facility (current plan for IV antibiotics) Wound Plan: see above  Wound Therapy Goals- Improve the function of patient's integumentary system by progressing the wound(s) through the phases of wound healing (inflammation - proliferation - remodeling) by: Decrease Necrotic Tissue to: 0% Decrease Necrotic Tissue - Progress: Met Increase Granulation Tissue to: 100% Increase Granulation Tissue - Progress: Met Improve Drainage Characteristics: Min Improve Drainage Characteristics - Progress: Met Patient/Family will be able to : verbalize/direct caregiver in dressing change to Rt elbow Patient/Family Instruction Goal -  Progress:  Met Goals/treatment plan/discharge plan were made with and agreed upon by patient/family: Yes Time For Goal Achievement: 6 days Wound Therapy - Potential for Goals: Good  Goals will be updated until maximal potential achieved or discharge criteria met.  Discharge criteria: when goals achieved, discharge from hospital, MD decision/surgical intervention, no progress towards goals, refusal/missing three consecutive treatments without notification or medical reason.  GP     Candy Sledge A 01/14/2015, 12:14 PM  Candy Sledge, [place], DPT [phone]

## 2015-01-14 NOTE — Progress Notes (Signed)
PT Cancellation Note  Patient Details Name: [name] MRN: [id number] DOB: 05-04-1986   Cancelled Treatment:     Pt refused PT treatment this morning. Pt reported to the nurse for us to come back later. Will attempt again at a later day or time.   Greggory StallionWrisley, Cloe Sockwell Kerstine 01/14/2015, 10:50 AM

## 2015-01-14 NOTE — Progress Notes (Signed)
Subjective:  Patient was seen and examined this morning. She continues to have severe pain in right upper extremity antecubital fossa and over right lower back. She denies fever, chills, nausea, or vomiting.   Objective:  Filed Vitals:   01/13/15 0518 01/13/15 1525 01/13/15 1956 01/14/15 0548  BP: 121/67 122/61 114/55 106/60  Pulse: 80 82 75 86  Temp: 98.6 F (37 C) 98.6 F (37 C) 98.5 F (36.9 C) 98.7 F (37.1 C)  TempSrc: Oral Oral Oral Oral  Resp: 16 16    Height:      Weight:      SpO2: 98% 98% 98% 96%   General: Vital signs reviewed.  Patient is well-developed and well-nourished, in no acute distress and cooperative with exam.  Cardiovascular: RRR Pulmonary/Chest: Clear to auscultation bilaterally, no wheezes, rales, or rhonchi. Abdominal: Soft, non-tender, non-distended, BS + Extremities: 8 cm incision with sutures, C/D/I, in right antecubital fossa without drainage. No lower extremity edema bilaterally. Neurological: A&O x3 Skin: Pustular vesicles, some with drainage and scabbing located over right lower back in T11-T12 dermatomal distribution.  Psychiatric: Normal mood and affect. speech and behavior is normal. Cognition and memory are normal.   Lab Results: Basic Metabolic Panel:  Recent Labs Lab 01/08/15 0616 01/09/15 0554  NA 139 139  K 3.8 3.6  CL 108 106  CO2 20 24  GLUCOSE 254* 91  BUN 6 <5*  CREATININE 0.65 0.69  CALCIUM 8.5 8.0*  MG 2.0  --   PHOS 2.4  --    CBC:  Recent Labs Lab 01/08/15 0616 01/09/15 0554  WBC 6.3 7.6  HGB 11.1* 11.0*  HCT 34.0* 33.0*  MCV 84.8 85.7  PLT 170 193   Micro: Blood cx 2/22>> Strep pyogenes  Wound cx 2/23>> few strep viridans  Gram stain 2/23>> Abundant WBCs, rare gram + cocci in pairs, rare GNRs  Anaerobic culture 2/23>> No anaerobes isolated  Blood cx 2/24>> NGTD  Medications: I have reviewed the patient's current medications. No current facility-administered medications on file prior to  encounter.   Current Outpatient Prescriptions on File Prior to Encounter  Medication Sig Dispense Refill  . citalopram (CELEXA) 40 MG tablet Take 1 tablet (40 mg total) by mouth daily. 10 tablet 0  . gabapentin (NEURONTIN) 300 MG capsule Take 1 capsule (300 mg total) by mouth 3 (three) times daily. 30 capsule 0  . citalopram (CELEXA) 40 MG tablet Take 1 tablet (40 mg total) by mouth daily. (Patient not taking: Reported on 01/06/2015) 10 tablet 0  . gabapentin (NEURONTIN) 300 MG capsule 2 pills three times a day (Patient not taking: Reported on 01/06/2015) 60 capsule 0  . [DISCONTINUED] amLODipine (NORVASC) 10 MG tablet Take 1 tablet (10 mg total) by mouth daily. 60 tablet 0  . [DISCONTINUED] fluticasone (FLONASE) 50 MCG/ACT nasal spray Place 2 sprays into the nose daily.        Current facility-administered medications:  .  ceFAZolin (ANCEF) IVPB 1 g/50 mL premix, 1 g, Intravenous, 3 times per day, Thuy Dien Dang, RPH, 1 g at 01/14/15 9604 .  citalopram (CELEXA) tablet 40 mg, 40 mg, Oral, Daily, Marjan Rabbani, MD, 40 mg at 01/14/15 1041 .  cyclobenzaprine (FLEXERIL) tablet 5 mg, 5 mg, Oral, TID PRN, Otis Brace, MD, 5 mg at 01/14/15 0403 .  diazepam (VALIUM) tablet 2 mg, 2 mg, Oral, Q12H PRN, Otis Brace, MD, 2 mg at 01/14/15 5409 .  diphenhydrAMINE (BENADRYL) capsule 25 mg, 25 mg, Oral, Q6H PRN,  Otis BraceMarjan Rabbani, MD, 25 mg at 01/13/15 2149 .  feeding supplement (ENSURE COMPLETE) (ENSURE COMPLETE) liquid 237 mL, 237 mL, Oral, BID BM, Alexa Richardson, MD, 237 mL at 01/14/15 1000 .  HYDROmorphone (DILAUDID) injection 1 mg, 1 mg, Intravenous, Q4H PRN, Marjan Rabbani, MD, 1 mg at 01/14/15 1044 .  insulin aspart (novoLOG) injection 0-15 Units, 0-15 Units, Subcutaneous, TID WC, Marjan Rabbani, MD, 2 Units at 01/13/15 1200 .  ondansetron (ZOFRAN) tablet 4 mg, 4 mg, Oral, Q6H PRN, 4 mg at 01/07/15 0322 **OR** ondansetron (ZOFRAN) injection 4 mg, 4 mg, Intravenous, Q6H PRN, Genelle GatherKathryn F Glenn, MD, 4 mg  at 01/11/15 0005 .  oxyCODONE (Oxy IR/ROXICODONE) immediate release tablet 5-10 mg, 5-10 mg, Oral, Q3H PRN, Sheran LawlessBrian L Buchanan, PA-C, 10 mg at 01/14/15 1041 .  pregabalin (LYRICA) capsule 50 mg, 50 mg, Oral, TID, Marjan Rabbani, MD, 50 mg at 01/14/15 1041 .  senna (SENOKOT) tablet 8.6 mg, 1 tablet, Oral, BID, Sheran LawlessBrian L Buchanan, PA-C, 8.6 mg at 01/13/15 2148 .  sodium chloride 0.9 % injection 10-40 mL, 10-40 mL, Intracatheter, PRN, Rocco SereneLawrence D Klima, MD, 10 mL at 01/11/15 [phone] .  valACYclovir (VALTREX) tablet 1,000 mg, 1,000 mg, Oral, Q8H, Marjan Rabbani, MD, 1,000 mg at 01/14/15 [id number] .  zolpidem (AMBIEN) tablet 5 mg, 5 mg, Oral, QHS PRN, Otis BraceMarjan Rabbani, MD, 5 mg at 01/13/15 2148  Assessment/Plan:  [name] is a 29 yo female with PMHx of IVDU, seizure disorder, and HTN who was admitted on 01/06/15 for suppurative thrombophlebitis or right upper extremity antecubital fossa with strep. Pyogenes bacteremia who was also found to have Hepatitis C and later developed Herpes Zoster Infection of right lower back.  Herpes Zoster Infection: Pt is non-immunocompromised with 2 day history of vesicular painful and pruritic rash on right lower back in dermatomal distribution (L5 or S1) consistent with herpes zoster. Patient cannot be discharged to SNF until lesions have scabbed and crusted over. -Airborne and contact precautions -Continue valtrex 1000 mg Q 8 hr for 7 days (end date 01/19/15- will need 1 dose that day) -Continue lyrica 50 mg TID  -Continue benadryl 25 mg Q6H PRN itching  -Consider lidocaine patch for post-herpetic pain once lesions have healed  Suppurative Thrombophlebitis with S. pyogenes Bacteremia: Patient has remained afebrile with no leukocytosis. Her pain is under control. Repeat blood cultures were NGTD. Wound cultures with few strep viridans, covered by Ancef. She will need a total of 14 days of IV antibiotics to treat her bacteremia. PICC line placed 2/27. [facility]; however, patient cannot be discharged until Shingles lesions have scabbed over.  -Discharge to SNF when Shingles infection has resolved.   -Ortho following, appreciate recommendations and help with this case -Dressing changes per Ortho. Will need stitches removed around 01/27/15 per ortho.  -Continue Ancef IV (14 days total, end date: 01/19/15) -Dilaudid 1 mg Q4H PRN  -Oxycodone 5-10 mg PO Q3H PRN -Continue flexeril 5 mg TID PRN muscle spasms  -Follow-up repeat blood cultures x 2 ----> NGTD  Active Hepatitis C: Hep C antibody positive and RNA quant >2.7 million. 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 Patient was given Hep A (Havrix) and B (Recombivax) vaccines. She will need follow up at 1 month to get 2nd Hep B vaccine and 6 months to get both Hep A and B vaccines to complete vaccination course. -Will need follow up appointment with hepatitis clinic as outpatient   Anxiety: Patient  takes Valium 1 mg BID, Celexa 40 mg daily, and Ambien at home.  -Continue Ambien 5 mg QHS PRN  -Continue Valium 2 mg Q12H PRN -Continue Celexa 40 mg daily  Insomnia: Reports persistent insomnia. -Continue Ambien 5 mg QHS PRN  -Continue Benadryl 25 mg PRN   Diet: Regular DVT/PE ppx: SCDs  Dispo: Disposition is deferred at this time, awaiting improvement of current medical problems.  Anticipated discharge in approximately 5 days to complete IV antibiotics, unless SNF placement sooner.   The patient does not have a current PCP (Pcp Not In System) and does need an [facility] hospital follow-up appointment after discharge.  The patient does not have transportation limitations that hinder transportation to clinic appointments.  .Services Needed at time of discharge: Y = Yes, Blank = No PT:   OT:   RN:   Equipment:   Other:     LOS: 8 days   Jill Alexanders, DO PGY-1 Internal Medicine Resident Pager # [phone] 01/14/2015 11:42 AM

## 2015-01-14 NOTE — Progress Notes (Signed)
Occupational Therapy Treatment Patient Details Name: [name] MRN: [id number] DOB: 1986-09-04 Today's Date: 01/14/2015    History of present illness [name] is a 29 year old woman with a history of intravenous heroin abuse, cocaine abuse, marijuana abuse, hypertension, and seizure disorder who presents with a 2 day history of fevers and pain in the right antecubital fossa. The pain and swelling began after injecting herself in that area with heroin. As the fevers climb to 103 at home and the pain progressively worsened she presented to the emergency department for further evaluation and was admitted to the internal medicine teaching service for a right antecubital cellulitis and possible abscess. Pt s/p  Irrigation and debridement of deep abscess, antecubital fossa, Excision of occluded venous system including the cephalic vein. Fasciotomy upper arm and forearm secondary to impending compartment    OT comments  Pt. Agreeable to participation in R UE ROM/exercises but c/o pain throughout session.  Limited elbow extension secondary to pain at incision site.  Educated on proper ROM  Technique and able to return demo with encouragement.  Assisted with pillow placement for proper elevation and reviewed composite exercises as well.  Pt. remains motivated and talks of being eager to move forward with positive life changes.    Follow Up Recommendations       Equipment Recommendations       Recommendations for Other Services      Precautions / Restrictions Precautions Precautions: Fall       Mobility Bed Mobility                  Transfers                      Balance                                   ADL                                                Vision                     Perception     Praxis      Cognition   Behavior During Therapy: WFL for tasks assessed/performed Overall Cognitive Status: Within  Functional Limits for tasks assessed                       Extremity/Trunk Assessment               Exercises Other Exercises Other Exercises: shoulder FF; ER abduction (horizontal). educated on importance of moving through ROM to prevent tightness in shoulder Other Exercises: composite flexion/extension Other Exercises: squeeze ball Other Exercises: elbow flexion/extension, limited extension secondary to c/o pain where incision site is, but educated on the technique   Shoulder Instructions       General Comments      Pertinent Vitals/ Pain       Pain Assessment:  (did not rate but continued to say "im just in a lof of pain, waiting for my 10 am meds") Pain Intervention(s): Monitored during session;Repositioned  Home Living  Prior Functioning/Environment              Frequency Min 3X/week     Progress Toward Goals  OT Goals(current goals can now be found in the care plan section)  Progress towards OT goals: Progressing toward goals     Plan      Co-evaluation                 End of Session     Activity Tolerance Patient tolerated treatment well   Patient Left in bed;with call bell/phone within reach   Nurse Communication          Time: [id number] OT Time Calculation (min): 15 min  Charges: OT General Charges $OT Visit: 1 Procedure OT Treatments $Therapeutic Exercise: 8-22 mins  Robet LeuMorris, Anjela Cassara Lorraine, COTA/L 01/14/2015, 9:22 AM

## 2015-01-15 LAB — GLUCOSE, CAPILLARY
GLUCOSE-CAPILLARY: 148 mg/dL — AB (ref 70–99)
GLUCOSE-CAPILLARY: 187 mg/dL — AB (ref 70–99)
GLUCOSE-CAPILLARY: 148 mg/dL — AB (ref 70–99)
Glucose-Capillary: 99 mg/dL (ref 70–99)

## 2015-01-15 MED ORDER — OXYCODONE HCL 5 MG PO TABS
5.0000 mg | ORAL_TABLET | ORAL | Status: DC | PRN
  Administered 2015-01-15 – 2015-01-16 (×7): 10 mg via ORAL
  Filled 2015-01-15 (×8): qty 2

## 2015-01-15 MED ORDER — HYDROMORPHONE HCL 1 MG/ML IJ SOLN
1.0000 mg | Freq: Three times a day (TID) | INTRAMUSCULAR | Status: DC | PRN
  Administered 2015-01-15 – 2015-01-16 (×4): 1 mg via INTRAVENOUS
  Filled 2015-01-15 (×4): qty 1

## 2015-01-15 MED ORDER — IBUPROFEN 200 MG PO TABS: 400.0000 mg | ORAL_TABLET | Freq: Four times a day (QID) | ORAL | Status: DC | PRN

## 2015-01-15 NOTE — Progress Notes (Signed)
PT Cancellation and Discharge Note  Patient Details Name: [name] MRN: [id number] DOB: 1986/07/20   Cancelled Treatment:    Reason Eval/Treat Not Completed: Other (comment)   Discussed pt's mobility status with RN;  Noted the last time pt participated with PT she was very near baseline amb;   Pt is currently able to independently walk in her room, without problem per RN;  No further PT needs for functional mobility identified;   Will sign off;   Thank you,  Van ClinesHolly Axtyn Camacho, PT  Acute Rehabilitation Services Pager [phone] Office [phone]   Van ClinesGarrigan, [name] [facility] 01/15/2015, 4:01 PM

## 2015-01-15 NOTE — Progress Notes (Signed)
On examination with NT, shingles rash on right lower back is scabbed over. No drainage noted.

## 2015-01-15 NOTE — Progress Notes (Signed)
Subjective:  Patient was seen and examined this morning. She continues to have pain at the surgical site in her right upper extremity antecubital fossa. She also has pain from her shingles, but the arm pain is worse. She states the pain wakes her up at night and makes her sweat. She denies fever or chills.   Objective:  Filed Vitals:   01/14/15 0548 01/14/15 1530 01/14/15 2049 01/15/15 0602  BP: 106/60 122/60 118/60 116/58  Pulse: 86 79 82 78  Temp: 98.7 F (37.1 C) 97.9 F (36.6 C) 98 F (36.7 C) 97.8 F (36.6 C)  TempSrc: Oral     Resp:  Height:      Weight:      SpO2: 96% 98% 98% 98%   General: Vital signs reviewed.  Patient is well-developed and well-nourished, in no acute distress and cooperative with exam.  Cardiovascular: RRR Pulmonary/Chest: Clear to auscultation bilaterally, no wheezes, rales, or rhonchi. Abdominal: Soft, non-tender, non-distended, BS + Extremities: Right antecubital fossa well dressed without obvious drainage. Right radial pulse 2+. No lower extremity edema bilaterally. Skin: Pustular vesicles ruptured and scabbing, located over right lower back in L5-S1 dermatomal distribution.  Psychiatric: Normal mood and affect. speech and behavior is normal. Cognition and memory are normal.   Lab Results: Basic Metabolic Panel:  Recent Labs Lab 01/09/15 0554  NA 139  K 3.6  CL 106  CO2 24  GLUCOSE 91  BUN <5*  CREATININE 0.69  CALCIUM 8.0*   CBC:  Recent Labs Lab 01/09/15 0554  WBC 7.6  HGB 11.0*  HCT 33.0*  MCV 85.7  PLT 193   Micro: Blood cx 2/22>> Strep pyogenes  Wound cx 2/23>> few strep viridans  Gram stain 2/23>> Abundant WBCs, rare gram + cocci in pairs, rare GNRs  Anaerobic culture 2/23>> No anaerobes isolated  Blood cx 2/24>> NGTD  Medications: I have reviewed the patient's current medications. No current facility-administered medications on file prior to encounter.   Current Outpatient Prescriptions on File  Prior to Encounter  Medication Sig Dispense Refill  . citalopram (CELEXA) 40 MG tablet Take 1 tablet (40 mg total) by mouth daily. 10 tablet 0  . gabapentin (NEURONTIN) 300 MG capsule Take 1 capsule (300 mg total) by mouth 3 (three) times daily. 30 capsule 0  . citalopram (CELEXA) 40 MG tablet Take 1 tablet (40 mg total) by mouth daily. (Patient not taking: Reported on 01/06/2015) 10 tablet 0  . gabapentin (NEURONTIN) 300 MG capsule 2 pills three times a day (Patient not taking: Reported on 01/06/2015) 60 capsule 0  . [DISCONTINUED] amLODipine (NORVASC) 10 MG tablet Take 1 tablet (10 mg total) by mouth daily. 60 tablet 0  . [DISCONTINUED] fluticasone (FLONASE) 50 MCG/ACT nasal spray Place 2 sprays into the nose daily.        Current facility-administered medications:  .  ceFAZolin (ANCEF) IVPB 1 g/50 mL premix, 1 g, Intravenous, 3 times per day, Thuy Dien Dang, RPH, 1 g at 01/15/15 1610 .  citalopram (CELEXA) tablet 40 mg, 40 mg, Oral, Daily, Marjan Rabbani, MD, 40 mg at 01/14/15 1041 .  cyclobenzaprine (FLEXERIL) tablet 5 mg, 5 mg, Oral, TID PRN, Otis Brace, MD, 5 mg at 01/15/15 0625 .  diazepam (VALIUM) tablet 2 mg, 2 mg, Oral, Q12H PRN, Otis Brace, MD, 2 mg at 01/15/15 0625 .  diphenhydrAMINE (BENADRYL) capsule 25 mg, 25 mg, Oral, Q6H PRN, Otis Brace, MD, 25 mg at 01/14/15 2219 .  feeding  supplement (ENSURE COMPLETE) (ENSURE COMPLETE) liquid 237 mL, 237 mL, Oral, BID BM, Alexa Richardson, MD, 237 mL at 01/14/15 1336 .  HYDROmorphone (DILAUDID) injection 1 mg, 1 mg, Intravenous, Q8H PRN, Jill Alexanders, MD .  ibuprofen (ADVIL,MOTRIN) tablet 400 mg, 400 mg, Oral, Q6H PRN, Jill Alexanders, MD .  insulin aspart (novoLOG) injection 0-15 Units, 0-15 Units, Subcutaneous, TID WC, Marjan Rabbani, MD, 3 Units at 01/14/15 1749 .  ondansetron (ZOFRAN) tablet 4 mg, 4 mg, Oral, Q6H PRN, 4 mg at 01/07/15 0322 **OR** ondansetron (ZOFRAN) injection 4 mg, 4 mg, Intravenous, Q6H PRN, Genelle Gather, MD, 4 mg at 01/11/15 0005 .  oxyCODONE (Oxy IR/ROXICODONE) immediate release tablet 5-10 mg, 5-10 mg, Oral, Q4H PRN, Jill Alexanders, MD .  pregabalin (LYRICA) capsule 50 mg, 50 mg, Oral, TID, Marjan Rabbani, MD, 50 mg at 01/14/15 2109 .  senna (SENOKOT) tablet 8.6 mg, 1 tablet, Oral, BID, Sheran Lawless, PA-C, 8.6 mg at 01/14/15 2109 .  sodium chloride 0.9 % injection 10-40 mL, 10-40 mL, Intracatheter, PRN, Rocco Serene, MD, 10 mL at 01/15/15 0543 .  valACYclovir (VALTREX) tablet 1,000 mg, 1,000 mg, Oral, Q8H, Marjan Rabbani, MD, 1,000 mg at 01/15/15 8119 .  zolpidem (AMBIEN) tablet 5 mg, 5 mg, Oral, QHS PRN, Otis Brace, MD, 5 mg at 01/14/15 2219  Assessment/Plan:  Ms. Breed is a 29 yo female with PMHx of IVDU, seizure disorder, and HTN who was admitted on 01/06/15 for suppurative thrombophlebitis or right upper extremity antecubital fossa with strep. Pyogenes bacteremia who was also found to have Hepatitis C and later developed Herpes Zoster Infection of right lower back.  Herpes Zoster Infection: Located in L5 or S1 dermatomal distribution. All vesicles have ruptured and are beginning to heal and scab. Patient cannot be discharged to SNF until lesions have scabbed and crusted over. -Airborne and contact precautions -Continue valtrex 1000 mg Q 8 hr for 7 days (end date 01/19/15- will need 1 dose that day) -Continue lyrica 50 mg TID  -Continue benadryl 25 mg Q6H PRN itching  -Consider lidocaine patch for post-herpetic pain once lesions have healed  Suppurative Thrombophlebitis with S. pyogenes Bacteremia: Afebrile. She continues to have pain, but we have agreed to wean her down off of pain medication in anticipation for discharge. Continue 14 days of IV antibiotics to treat her bacteremia. PICC line placed 2/27. SNF bed availablet at [facility]; however, patient cannot be discharged until Shingles lesions have scabbed over.  -Discharge to SNF/home when Shingles  infection has resolved.   -Dressing changes per Ortho. Will need stitches removed around 01/27/15 per ortho.  -Continue Ancef IV (14 days total, end date: 01/19/15) -Dilaudid 1 mg Q8H PRN  -Oxycodone 5-10 mg PO Q5H PRN -Ibuprofen 400 mg Q6H prn -Continue flexeril 5 mg TID PRN muscle spasms  -Repeat blood cultures x 2 ----> NGTD  Active Hepatitis C: Hep C antibody positive and RNA quant >2.7 million. 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 Patient was given Hep A (Havrix) and B (Recombivax) vaccines. She will need follow up at 1 month to get 2nd Hep B vaccine and 6 months to get both Hep A and B vaccines to complete vaccination course. -Follow up appointment with hepatitis clinic on 02/05/15 at 3:30 pm -Hepatitis C genotype  Anxiety: Patient takes Valium 1 mg BID, Celexa 40 mg daily, and Ambien at home.  -Continue Ambien 5 mg QHS PRN  -Continue Valium 2 mg Q12H PRN -  Continue Celexa 40 mg daily  Insomnia: Reports persistent insomnia, mostly due to pain. -Continue Ambien 5 mg QHS PRN  -Continue Benadryl 25 mg PRN   Diet: Regular  DVT/PE ppx: SCDs  Dispo: Disposition is deferred at this time, awaiting improvement of current medical problems.  Anticipated discharge in approximately 4 days to complete IV antibiotics, unless SNF placement sooner.   The patient does not have a current PCP (Pcp Not In System) and [facility].  The patient does not have transportation limitations that hinder transportation to clinic appointments.  .Services Needed at time of discharge: Y = Yes, Blank = No PT:   OT:   RN:   Equipment:   Other:     LOS: 9 days   Jill AlexandersAlexa Richardson, DO PGY-1 Internal Medicine Resident Pager # [phone] 01/15/2015 8:47 AM

## 2015-01-16 ENCOUNTER — Inpatient Hospital Stay
Admission: RE | Admit: 2015-01-16 | Discharge: 2015-01-23 | Disposition: A | Discharge status: Home / Self Care | Payer: MEDICAID | Source: Ambulatory Visit | Attending: Internal Medicine | Admitting: Internal Medicine

## 2015-01-16 DIAGNOSIS — B954 Other streptococcus as the cause of diseases classified elsewhere: Secondary | ICD-10-CM

## 2015-01-16 LAB — GLUCOSE, CAPILLARY
GLUCOSE-CAPILLARY: 90 mg/dL (ref 70–99)
GLUCOSE-CAPILLARY: 202 mg/dL — AB (ref 70–99)

## 2015-01-16 LAB — HEPATITIS C GENOTYPE

## 2015-01-16 MED ORDER — ZOLPIDEM TARTRATE 5 MG PO TABS: 5.0000 mg | ORAL_TABLET | Freq: Every evening | ORAL | Status: DC | PRN

## 2015-01-16 MED ORDER — PREGABALIN 50 MG PO CAPS: 50.0000 mg | ORAL_CAPSULE | Freq: Three times a day (TID) | ORAL | Status: DC

## 2015-01-16 MED ORDER — CEFAZOLIN SODIUM 1-5 GM-% IV SOLN: 1.0000 g | Freq: Three times a day (TID) | INTRAVENOUS | Status: DC

## 2015-01-16 MED ORDER — VALACYCLOVIR HCL 1 G PO TABS: 1000.0000 mg | ORAL_TABLET | Freq: Three times a day (TID) | ORAL | Status: DC

## 2015-01-16 MED ORDER — OXYCODONE HCL 15 MG PO TABS: 15.0000 mg | ORAL_TABLET | ORAL | Status: DC | PRN

## 2015-01-16 MED ORDER — IBUPROFEN 400 MG PO TABS: 400.0000 mg | ORAL_TABLET | Freq: Four times a day (QID) | ORAL | Status: DC | PRN

## 2015-01-16 MED ORDER — SENNA 8.6 MG PO TABS: 1.0000 | ORAL_TABLET | Freq: Two times a day (BID) | ORAL | Status: DC | PRN

## 2015-01-16 MED ORDER — CYCLOBENZAPRINE HCL 5 MG PO TABS: 5.0000 mg | ORAL_TABLET | Freq: Three times a day (TID) | ORAL | Status: DC | PRN

## 2015-01-16 MED ORDER — DIPHENHYDRAMINE HCL 25 MG PO CAPS: 25.0000 mg | ORAL_CAPSULE | Freq: Every evening | ORAL | Status: DC | PRN

## 2015-01-16 NOTE — Clinical Social Work Note (Signed)
Patient to be d/c[facility]ed today to [facility].  Patient agreeable to plans will transport via ems RN to call report.  Windell MouldingEric Ashlin Hidalgo, MSW, Theresia MajorsLCSWA [phone]

## 2015-01-16 NOTE — Progress Notes (Signed)
Subjective:  Patient was seen and examined this morning. Patient states pain was uncontrolled yesterday and overnight on new regimen; however, she admits to not trying advil. She denies any fever or chills.   Objective:  Filed Vitals:   01/15/15 0602 01/15/15 1300 01/15/15 2053 01/16/15 0411  BP: 116/58 130/88 128/78 124/66  Pulse: 78 94 88 97  Temp: 97.8 F (36.6 C) 98.4 F (36.9 C) 98.2 F (36.8 C) 98.2 F (36.8 C)  TempSrc:      Resp: Height:      Weight:      SpO2: 98% 100% 99% 98%   General: Vital signs reviewed.  Patient is well-developed and well-nourished, in no acute distress and cooperative with exam.  Cardiovascular: RRR Pulmonary/Chest: Clear to auscultation bilaterally, no wheezes, rales, or rhonchi. Abdominal: Soft, non-tender, non-distended, BS + Extremities: Right antecubital fossa 10 cm surgical incision with sutures, C/D/I, minimal drainage. Right radial pulse 2+. No lower extremity edema bilaterally. Skin: Scabbed lesions over right lower back in L5-S1 dermatomal distribution. No drainage or vesicles. Psychiatric: Normal mood and affect. speech and behavior is normal. Cognition and memory are normal.   Micro: Blood cx 2/22>> Strep pyogenes  Wound cx 2/23>> few strep viridans  Gram stain 2/23>> Abundant WBCs, rare gram + cocci in pairs, rare GNRs  Anaerobic culture 2/23>> No anaerobes isolated  Blood cx 2/24>> NGTD  Medications: I have reviewed the patient's current medications. No current facility-administered medications on file prior to encounter.   Current Outpatient Prescriptions on File Prior to Encounter  Medication Sig Dispense Refill  . citalopram (CELEXA) 40 MG tablet Take 1 tablet (40 mg total) by mouth daily. 10 tablet 0  . gabapentin (NEURONTIN) 300 MG capsule Take 1 capsule (300 mg total) by mouth 3 (three) times daily. 30 capsule 0  . citalopram (CELEXA) 40 MG tablet Take 1 tablet (40 mg total) by mouth daily. (Patient not  taking: Reported on 01/06/2015) 10 tablet 0  . gabapentin (NEURONTIN) 300 MG capsule 2 pills three times a day (Patient not taking: Reported on 01/06/2015) 60 capsule 0  . [DISCONTINUED] amLODipine (NORVASC) 10 MG tablet Take 1 tablet (10 mg total) by mouth daily. 60 tablet 0  . [DISCONTINUED] fluticasone (FLONASE) 50 MCG/ACT nasal spray Place 2 sprays into the nose daily.        Current facility-administered medications:  .  ceFAZolin (ANCEF) IVPB 1 g/50 mL premix, 1 g, Intravenous, 3 times per day, Thuy Dien Dang, RPH, 1 g at 01/16/15 1610 .  citalopram (CELEXA) tablet 40 mg, 40 mg, Oral, Daily, Marjan Rabbani, MD, 40 mg at 01/16/15 0840 .  cyclobenzaprine (FLEXERIL) tablet 5 mg, 5 mg, Oral, TID PRN, Otis Brace, MD, 5 mg at 01/16/15 0636 .  diazepam (VALIUM) tablet 2 mg, 2 mg, Oral, Q12H PRN, Otis Brace, MD, 2 mg at 01/16/15 0636 .  diphenhydrAMINE (BENADRYL) capsule 25 mg, 25 mg, Oral, Q6H PRN, Otis Brace, MD, 25 mg at 01/15/15 2217 .  HYDROmorphone (DILAUDID) injection 1 mg, 1 mg, Intravenous, Q8H PRN, Jill Alexanders, MD, 1 mg at 01/16/15 1019 .  ibuprofen (ADVIL,MOTRIN) tablet 400 mg, 400 mg, Oral, Q6H PRN, Jill Alexanders, MD .  insulin aspart (novoLOG) injection 0-15 Units, 0-15 Units, Subcutaneous, TID WC, Marjan Rabbani, MD, 3 Units at 01/15/15 1707 .  ondansetron (ZOFRAN) tablet 4 mg, 4 mg, Oral, Q6H PRN, 4 mg at 01/07/15 0322 **OR** ondansetron (ZOFRAN) injection 4 mg, 4 mg, Intravenous, Q6H PRN,  Genelle Gather, MD, 4 mg at 01/11/15 0005 .  oxyCODONE (Oxy IR/ROXICODONE) immediate release tablet 5-10 mg, 5-10 mg, Oral, Q4H PRN, Jill Alexanders, MD, 10 mg at 01/16/15 0840 .  pregabalin (LYRICA) capsule 50 mg, 50 mg, Oral, TID, Marjan Rabbani, MD, 50 mg at 01/16/15 0840 .  senna (SENOKOT) tablet 8.6 mg, 1 tablet, Oral, BID, Sheran Lawless, PA-C, 8.6 mg at 01/16/15 0840 .  sodium chloride 0.9 % injection 10-40 mL, 10-40 mL, Intracatheter, PRN, Rocco Serene, MD, 10 mL at  01/15/15 0543 .  valACYclovir (VALTREX) tablet 1,000 mg, 1,000 mg, Oral, Q8H, Marjan Rabbani, MD, 1,000 mg at 01/16/15 1610 .  zolpidem (AMBIEN) tablet 5 mg, 5 mg, Oral, QHS PRN, Otis Brace, MD, 5 mg at 01/15/15 2217  Assessment/Plan:  Ms. Daw is a 29 yo female with PMHx of IVDU, seizure disorder, and HTN who was admitted on 01/06/15 for suppurative thrombophlebitis or right upper extremity antecubital fossa with strep. Pyogenes bacteremia who was also found to have Hepatitis C and later developed Herpes Zoster Infection of right lower back.  Herpes Zoster Infection: Located in L5 or S1 dermatomal distribution, all vesicles have ruptured, scabbed and are without drainage. -Discontinue airborne and contact precautions -Continue valtrex 1000 mg Q 8 hr for 7 days (end date 01/19/15- will need 1 dose that day) -Continue lyrica 50 mg TID  -Continue benadryl 25 mg Q6H PRN itching  -Consider lidocaine patch for post-herpetic pain if needed  Suppurative Thrombophlebitis with S. pyogenes Bacteremia: Afebrile. Continue IV antibiotics at SNF to treat her bacteremia. PICC line placed 2/27. D/c to [facility] today since Shingles lesions have scabbed over.  -Discharge to SNF today  -Dressing changes per Ortho. Will need stitches removed around 01/27/15 per ortho.  -Continue Ancef IV (14 days total, end date: 01/19/15) -Dilaudid 1 mg Q8H PRN  -Oxycodone 5-10 mg PO Q4H PRN -Ibuprofen 400 mg Q6H prn -Continue flexeril 5 mg TID PRN muscle spasms  -Repeat blood cultures x 2 ----> NGTD  Active Hepatitis C 1a: Hep C antibody positive and RNA quant >2.7 million. 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 Patient was given Hep A (Havrix) and B (Recombivax) vaccines. She will need follow up at 1 month to get 2nd Hep B vaccine and 6 months to get both Hep A and B vaccines to complete vaccination course. -Follow up appointment with hepatitis clinic on 02/05/15 at 3:30 pm  Anxiety:  Patient takes Valium 1 mg BID, Celexa 40 mg daily, and Ambien at home.  -Continue Ambien 5 mg QHS PRN  -Continue Valium 2 mg Q12H PRN -Continue Celexa 40 mg daily  Insomnia: Reports persistent insomnia, mostly due to pain. -Continue Ambien 5 mg QHS PRN  -Continue Benadryl 25 mg PRN   Diet: Regular  DVT/PE ppx: SCDs  Dispo: Disposition is deferred at this time, awaiting improvement of current medical problems.  Anticipated discharge in approximately 4 days to complete IV antibiotics, unless SNF placement sooner.   The patient does not have a current PCP (Pcp Not In System) and does need an [facility] hospital follow-up appointment after discharge.  The patient does not have transportation limitations that hinder transportation to clinic appointments.  .Services Needed at time of discharge: Y = Yes, Blank = No PT:   OT:   RN:   Equipment:   Other:     LOS: 10 days   Jill Alexanders, DO PGY-1 Internal Medicine Resident Pager # [phone]  01/16/2015 11:32 AM

## 2015-01-16 NOTE — Progress Notes (Signed)
Attempted to call report to [facility]. Transferred and on hold for > 15 minutes; never got through.  Folder with pertinent information went with patient. [facility] stating that they do not carry Cefazoline. Discussed issue with SW who informed this nurse that [facility] would take care of ordering that medication.

## 2015-01-16 NOTE — Discharge Instructions (Signed)
·   Thank you for allowing us to be involved in your healthcare while you were hospitalized at [facility].   Please note that there have been changes to your home medications.  --> PLEASE LOOK AT YOUR DISCHARGE MEDICATION LIST FOR DETAILS.  Please call your PCP if you have any questions or concerns, or any difficulty getting any of your medications.  Please return to the ER if you have worsening of your symptoms or new severe symptoms arise.  Please follow up with [facility] to establish care with a primary care doctor. You will need to go to the clinic and wait to be seen as it is first come, first serve. We recommend going in the next 1-2 weeks and getting there early around 8:30 am. The address and phone number are listed in this packet.  Please follow up in the Infectious Disease Hepatitis clinic as listed.  Please follow up with Orthopedic Surgery for suture removal as listed in the packet.

## 2015-01-16 NOTE — Progress Notes (Signed)
Patient has been eating a full regular meal with 2 desserts plus Ensure two times daily between meals. Blood glucose yesterday 01/15/15 was 148, 187, 148. It appears she may not need the supplement this time as she has a hearty appetite. Blood glucose this AM was 90.

## 2015-01-20 ENCOUNTER — Non-Acute Institutional Stay (SKILLED_NURSING_FACILITY): Payer: PRIVATE HEALTH INSURANCE | Admitting: Internal Medicine

## 2015-01-20 DIAGNOSIS — B1710 Acute hepatitis C without hepatic coma: Secondary | ICD-10-CM

## 2015-01-20 DIAGNOSIS — A400 Sepsis due to streptococcus, group A: Secondary | ICD-10-CM | POA: Diagnosis not present

## 2015-01-20 DIAGNOSIS — L02413 Cutaneous abscess of right upper limb: Secondary | ICD-10-CM

## 2015-01-22 ENCOUNTER — Non-Acute Institutional Stay (SKILLED_NURSING_FACILITY): Payer: PRIVATE HEALTH INSURANCE | Admitting: Internal Medicine

## 2015-01-22 ENCOUNTER — Other Ambulatory Visit: Payer: Self-pay

## 2015-01-22 DIAGNOSIS — L02413 Cutaneous abscess of right upper limb: Secondary | ICD-10-CM

## 2015-01-22 DIAGNOSIS — A400 Sepsis due to streptococcus, group A: Secondary | ICD-10-CM | POA: Diagnosis not present

## 2015-01-22 MED ORDER — OXYCODONE HCL 15 MG PO TABS: 15.0000 mg | ORAL_TABLET | ORAL | Status: DC | PRN

## 2015-01-22 NOTE — Telephone Encounter (Signed)
RX faxed to [facility] @ [phone]. Phone number [phone]  

## 2015-01-23 NOTE — Progress Notes (Addendum)
Patient ID: [name], female   DOB: October 28, 1986, 29 y.o.   MRN: [id number]                 HISTORY & PHYSICAL  DATE:  01/20/2015             FACILITY: [facility]        LEVEL OF CARE:   SNF   HISTORY OF PRESENT ILLNESS:  This is a 29 year-old woman with a history of IV drug abuse.  Presented with fever, pain and swelling in her right antecubital fossa, having used intravenous drugs there, specifically heroin, daily for a year.    Her blood cultures were positive for group A strep.  Orthopedic surgery performed an I&D.  She was also found to have a large thrombus in the cephalic and basilar systems in addition to the deep abscess.  A TEE was performed that showed no evidence of vegetation.  She was discharged on IV Ancef, to be completed for a total of 14 days, ending yesterday.    Also noted to have an acute hepatitis C infection with hepatitis C antibody positive and RNA quantitated at greater than 2.7 million.  She was given the hep A and B vaccines, for which she will need to follow up in a month to get the second hepatitis B vaccine course and then six months to get both hep A and B vaccines.    Finally, she developed a zoster-like rash on her back and is on Valtrex.     PAST MEDICAL HISTORY/PROBLEM LIST:                              Seizure disorder.    Cardiac murmur.    Hypertension.    Anxiety.    Insomnia.    CURRENT MEDICATIONS:  Discharge medications include:    Ancef 1 g every 8 hours, completed yesterday.    Celexa 40 q.d.      Flexeril 5 mg three times daily.    Benadryl 25 at bedtime as needed for sleep.    Motrin 400 q.6.    Oxycodone 15 mg q.4 p.r.n.       Lyrica 50 mg  every three days.    Senokot 8.6 b.i.d.      Valtrex 1000 every 8 hours, which also ended on 01/19/2015.    Ambien 5 mg q.h.s.      SOCIAL HISTORY:                   HOUSING:  The patient tells me that she was living with her father and his girlfriend in  [place].  She tells me that he is now in [place].  I am not exactly sure what plans she has, if any, for discharge.    REVIEW OF SYSTEMS:            GENERAL:  The patient tells me she was febrile and vomited last night.    CHEST/RESPIRATORY:  No cough.  No sputum.   CARDIAC:  No chest pain.    GI:  No abdominal pain or diarrhea.   GU:  No dysuria.    MUSCULOSKELETAL:  She is complaining of pain in the arm, including the operative site; also in her right shoulder and axilla.    PHYSICAL EXAMINATION:   GENERAL APPEARANCE:  The patient is not in any distress.    CHEST/RESPIRATORY:  Clear air  entry bilaterally.     CARDIOVASCULAR:   CARDIAC:  Heart sounds are normal.  I did not appreciate any murmurs.   GASTROINTESTINAL:   ABDOMEN:  There is no tenderness.     LIVER/SPLEEN/KIDNEY:   Liver is not palpable.  She does have a palpable spleen tip.   GENITOURINARY:   BLADDER:  Not distended.  There is no CVA tenderness.   CIRCULATION:   EDEMA/VARICOSITIES:  Lower extremities:  No edema.     SKIN:   INSPECTION:  In the right arm, there is a surgical incision.  There are still sutures in this in the right antecubital fossa.  This actually looks fairly stable.  There is no drainage and everything looks fine here.   LYMPHATICS:  In the right axilla, there is a marked degree of tenderness superiorly, right at the dome of this structure.   I almost wonder if there is a palpable node here.     MUSCULOSKELETAL:    EXTREMITIES:   RIGHT UPPER EXTREMITY:  I did not feel any effusion in her shoulder.     ASSESSMENT/PLAN:                                            Group A strep sepsis.  She has completed her Ancef, actually as of yesterday.  I reviewed her cultures.  I did not see anything else here.  The operative culture in the right antecubital fossa showed a few strep viridans, which should have been well covered by the Ancef.    Right shoulder pain.  ?Node in the right axilla.  I am going to have  another look at this maybe later today or when I am next in the building.    Acute hepatitis C.  She is supposed to follow in the Hepatitis Clinic when she leaves.    Seizure disorder.  Not on any current therapy.    I cannot see any imaging studies of the right shoulder.  She did have surveillance blood cultures before she left that were negative.  Her Valtrex also is finishing here.    Her lab work all looked fairly unremarkable.  Pregnancy test was negative.   HIV negative.

## 2015-01-26 NOTE — Progress Notes (Addendum)
Patient ID: Jamie FrederickKrystal D Camacho, female   DOB: [birth year], 29 y.o.   MRN: [id number]                PROGRESS NOTE  DATE:  01/22/2015          FACILITY: [facility]      LEVEL OF CARE:   SNF   Acute Visit                   CHIEF COMPLAINT:  Pre-discharge review.   HISTORY OF PRESENT ILLNESS:  This is a patient who arrived in hospital with thrombophlebitis with bacteremia.  She, I think, ultimately cultured group A strep out of her antecubital fossa.   She required an I&D.   She also had a large thrombus in the cephalic and basilar systems.  Her blood cultures grew group A strep.  A TEE confirmed no vegetations.  She came here for a few days to finish the IV Ancef which she completed, I think, yesterday.    During my initial evaluation of her, I wondered about a node in her right axilla.  I have come back today to see her over this issue and also discharge.    PHYSICAL EXAMINATION:   SKIN:   INSPECTION:  Right antecubital fossa:  The surgical wound looks fine.  There are no issues here.  I will have the wound care nurse remove the sutures.   LYMPHATICS:  Right axilla:  Careful examination done here in all three areas.  There is no adenopathy that I can feel.  Nothing seems overtly tender.    ASSESSMENT/PLAN:                         Septic Thrombophlebitis with group A sepsis.   She has completed her antibiotics.  Her PICC line will be removed.  I will give her enough of her oxycodone for a week.

## 2015-02-05 ENCOUNTER — Ambulatory Visit: Payer: Medicaid Other | Admitting: Internal Medicine

## 2015-03-26 IMAGING — DX DG CHEST 2V
2 series · 2 of 2 positions shown · non-contrast
Comparison: None.

CLINICAL DATA: Right-sided arm pain and swelling. IV drug use on
same side

EXAM:
CHEST  2 VIEW

[chest lat]
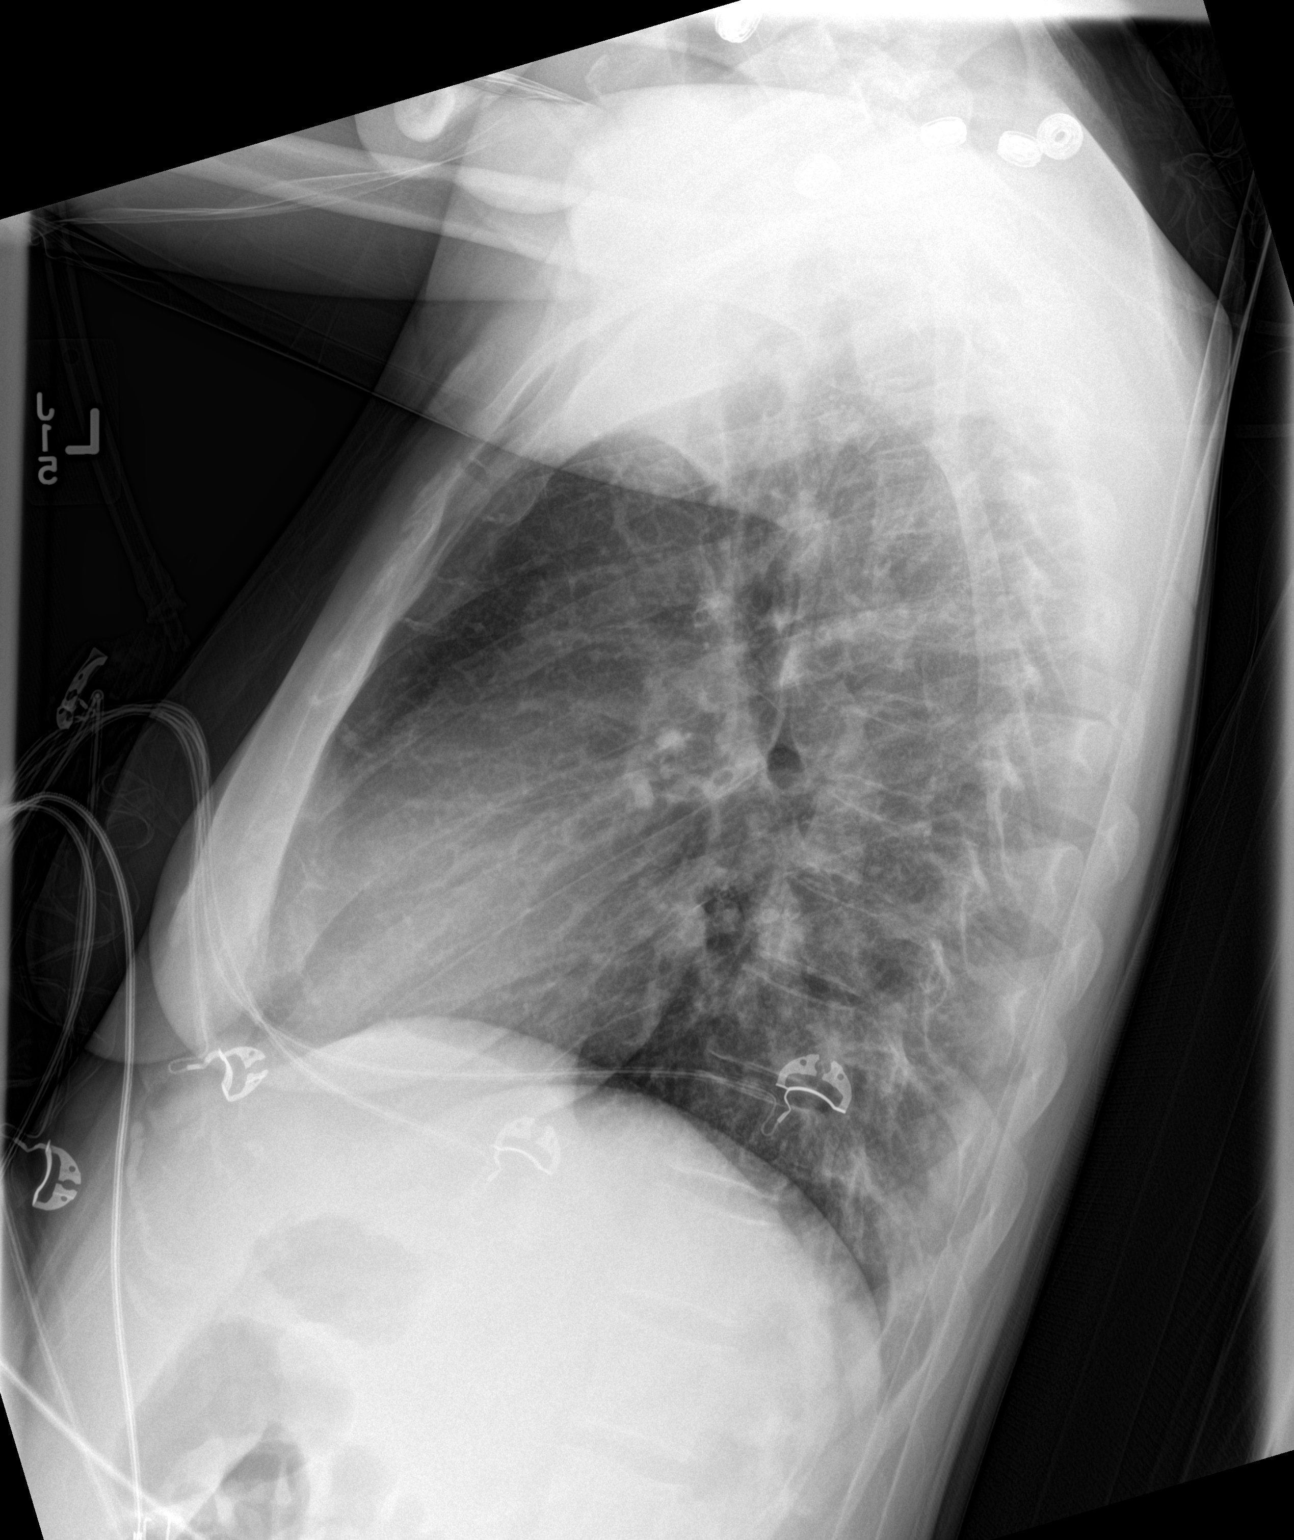

[chest ap]
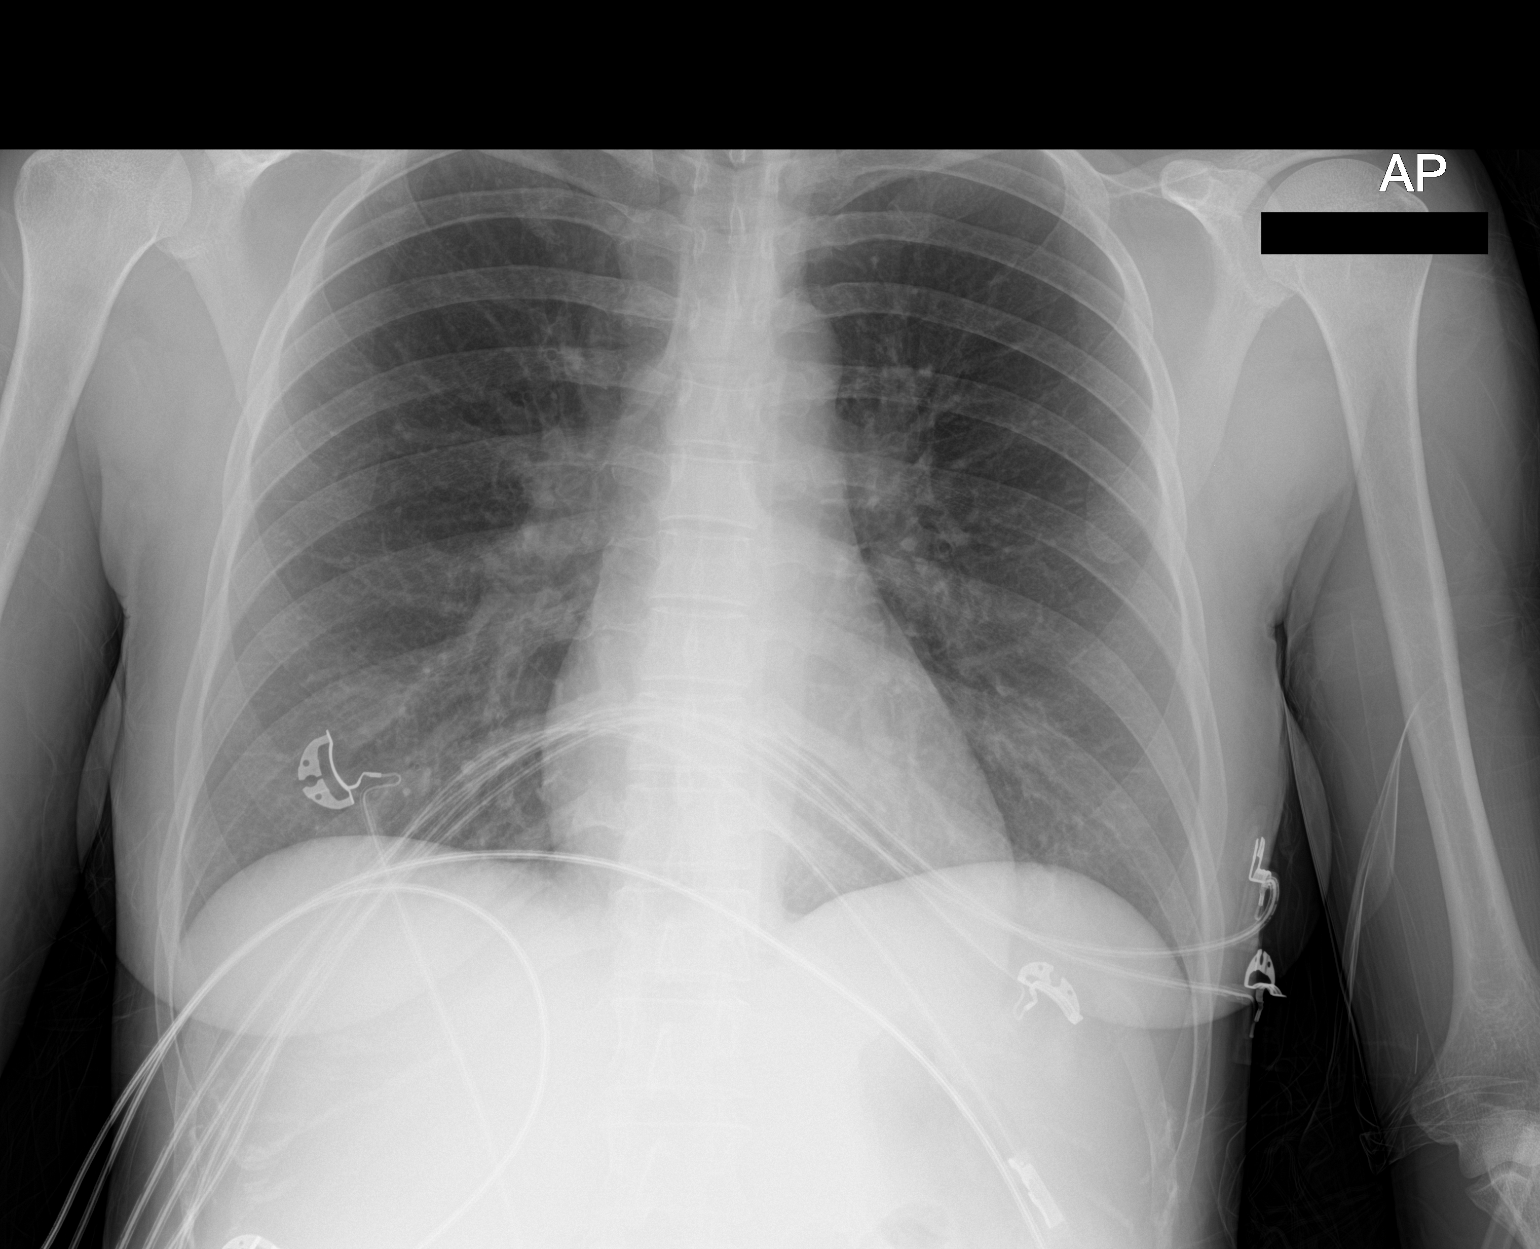

[2 of 2 positions shown; findings below may reference images not displayed]

FINDINGS: Normal mediastinum and cardiac silhouette. Normal pulmonary
vasculature. No evidence of effusion, infiltrate, or pneumothorax.
No acute bony abnormality.
IMPRESSION: Normal chest radiograph.

## 2015-04-23 ENCOUNTER — Ambulatory Visit (HOSPITAL_COMMUNITY): Payer: Self-pay

## 2015-04-23 ENCOUNTER — Emergency Department (HOSPITAL_COMMUNITY): Payer: Self-pay

## 2015-04-23 ENCOUNTER — Encounter (HOSPITAL_COMMUNITY): Payer: Self-pay | Admitting: Emergency Medicine

## 2015-04-23 ENCOUNTER — Inpatient Hospital Stay (HOSPITAL_COMMUNITY)
Admission: EM | Admit: 2015-04-23 | Discharge: 2015-04-26 | DRG: 871 | Disposition: A | Discharge status: Home / Self Care | Payer: Self-pay | Attending: Internal Medicine | Admitting: Internal Medicine

## 2015-04-23 DIAGNOSIS — Z833 Family history of diabetes mellitus: Secondary | ICD-10-CM

## 2015-04-23 DIAGNOSIS — E876 Hypokalemia: Secondary | ICD-10-CM

## 2015-04-23 DIAGNOSIS — Z8249 Family history of ischemic heart disease and other diseases of the circulatory system: Secondary | ICD-10-CM

## 2015-04-23 DIAGNOSIS — F1990 Other psychoactive substance use, unspecified, uncomplicated: Secondary | ICD-10-CM | POA: Diagnosis present

## 2015-04-23 DIAGNOSIS — I10 Essential (primary) hypertension: Secondary | ICD-10-CM | POA: Diagnosis present

## 2015-04-23 DIAGNOSIS — F17210 Nicotine dependence, cigarettes, uncomplicated: Secondary | ICD-10-CM | POA: Diagnosis present

## 2015-04-23 DIAGNOSIS — J189 Pneumonia, unspecified organism: Secondary | ICD-10-CM

## 2015-04-23 DIAGNOSIS — A419 Sepsis, unspecified organism: Principal | ICD-10-CM | POA: Diagnosis present

## 2015-04-23 DIAGNOSIS — F1410 Cocaine abuse, uncomplicated: Secondary | ICD-10-CM | POA: Diagnosis present

## 2015-04-23 DIAGNOSIS — Y95 Nosocomial condition: Secondary | ICD-10-CM | POA: Diagnosis present

## 2015-04-23 DIAGNOSIS — B182 Chronic viral hepatitis C: Secondary | ICD-10-CM | POA: Diagnosis present

## 2015-04-23 LAB — I-STAT CG4 LACTIC ACID, ED
Lactic Acid, Venous: 2.84 mmol/L (ref 0.5–2.0)
Lactic Acid, Venous: 1.57 mmol/L (ref 0.5–2.0)

## 2015-04-23 LAB — CBC WITH DIFFERENTIAL/PLATELET
BASOS PCT: 0 % (ref 0–1)
Basophils Absolute: 0 10*3/uL (ref 0.0–0.1)
EOS ABS: 0 10*3/uL (ref 0.0–0.7)
Eosinophils Relative: 0 % (ref 0–5)
HCT: 43.8 % (ref 36.0–46.0)
Hemoglobin: 15.1 g/dL — ABNORMAL HIGH (ref 12.0–15.0)
LYMPHS PCT: 4 % — AB (ref 12–46)
Lymphs Abs: 0.7 10*3/uL (ref 0.7–4.0)
MCH: 29.9 pg (ref 26.0–34.0)
MCHC: 34.5 g/dL (ref 30.0–36.0)
MCV: 86.7 fL (ref 78.0–100.0)
Monocytes Absolute: 1.4 10*3/uL — ABNORMAL HIGH (ref 0.1–1.0)
Monocytes Relative: 8 % (ref 3–12)
NEUTROS ABS: 15.7 10*3/uL — AB (ref 1.7–7.7)
Neutrophils Relative %: 88 % — ABNORMAL HIGH (ref 43–77)
PLATELETS: 186 10*3/uL (ref 150–400)
RBC: 5.05 MIL/uL (ref 3.87–5.11)
RDW: 14.1 % (ref 11.5–15.5)
WBC: 17.9 10*3/uL — ABNORMAL HIGH (ref 4.0–10.5)

## 2015-04-23 LAB — COMPREHENSIVE METABOLIC PANEL
ALBUMIN: 3.5 g/dL (ref 3.5–5.0)
ALT: 48 U/L (ref 14–54)
AST: 39 U/L (ref 15–41)
Alkaline Phosphatase: 58 U/L (ref 38–126)
Anion gap: 11 (ref 5–15)
BUN: 9 mg/dL (ref 6–20)
CALCIUM: 8.9 mg/dL (ref 8.9–10.3)
CO2: 22 mmol/L (ref 22–32)
Chloride: 103 mmol/L (ref 101–111)
Creatinine, Ser: 0.68 mg/dL (ref 0.44–1.00)
GFR calc Af Amer: >60 mL/min (ref >60)
GFR calc non Af Amer: >60 mL/min (ref >60)
Glucose, Bld: 190 mg/dL — ABNORMAL HIGH (ref 65–99)
Potassium: 2.9 mmol/L — ABNORMAL LOW (ref 3.5–5.1)
Sodium: 136 mmol/L (ref 135–145)
Total Bilirubin: 0.9 mg/dL (ref 0.3–1.2)
Total Protein: 7.3 g/dL (ref 6.5–8.1)

## 2015-04-23 LAB — URINALYSIS, ROUTINE W REFLEX MICROSCOPIC
BILIRUBIN URINE: NEGATIVE
GLUCOSE, UA: 250 mg/dL — AB
Hgb urine dipstick: NEGATIVE
Ketones, ur: NEGATIVE mg/dL
Leukocytes, UA: NEGATIVE
Nitrite: NEGATIVE
PROTEIN: 30 mg/dL — AB
Specific Gravity, Urine: 1.019 (ref 1.005–1.030)
Urobilinogen, UA: 1 mg/dL (ref 0.0–1.0)
pH: 6.5 (ref 5.0–8.0)

## 2015-04-23 LAB — URINE MICROSCOPIC-ADD ON

## 2015-04-23 LAB — STREP PNEUMONIAE URINARY ANTIGEN: STREP PNEUMO URINARY ANTIGEN: NEGATIVE

## 2015-04-23 LAB — RAPID URINE DRUG SCREEN, HOSP PERFORMED
Amphetamines: NOT DETECTED
Barbiturates: NOT DETECTED
Benzodiazepines: NOT DETECTED
COCAINE: POSITIVE — AB
Opiates: POSITIVE — AB
Tetrahydrocannabinol: NOT DETECTED

## 2015-04-23 LAB — PREGNANCY, URINE: PREG TEST UR: NEGATIVE

## 2015-04-23 MED ORDER — LEVOFLOXACIN IN D5W 750 MG/150ML IV SOLN: 750.0000 mg | INTRAVENOUS | Status: DC

## 2015-04-23 MED ORDER — DEXTROSE 5 % IV SOLN
2.0000 g | Freq: Once | INTRAVENOUS | Status: AC
  Administered 2015-04-23: 2 g via INTRAVENOUS
  Filled 2015-04-23: qty 2

## 2015-04-23 MED ORDER — GABAPENTIN 300 MG PO CAPS
600.0000 mg | ORAL_CAPSULE | Freq: Four times a day (QID) | ORAL | Status: DC
  Administered 2015-04-23 – 2015-04-26 (×11): 600 mg via ORAL
  Filled 2015-04-23 (×14): qty 2

## 2015-04-23 MED ORDER — SODIUM CHLORIDE 0.9 % IJ SOLN
3.0000 mL | Freq: Two times a day (BID) | INTRAMUSCULAR | Status: DC
  Administered 2015-04-23 – 2015-04-26 (×3): 3 mL via INTRAVENOUS

## 2015-04-23 MED ORDER — FENTANYL CITRATE (PF) 100 MCG/2ML IJ SOLN
50.0000 ug | Freq: Once | INTRAMUSCULAR | Status: AC
  Administered 2015-04-23: 50 ug via INTRAVENOUS
  Filled 2015-04-23: qty 2

## 2015-04-23 MED ORDER — SODIUM CHLORIDE 0.9 % IV SOLN
INTRAVENOUS | Status: DC
  Administered 2015-04-24 – 2015-04-26 (×3): via INTRAVENOUS

## 2015-04-23 MED ORDER — VANCOMYCIN HCL IN DEXTROSE 750-5 MG/150ML-% IV SOLN
750.0000 mg | Freq: Three times a day (TID) | INTRAVENOUS | Status: DC
  Administered 2015-04-23 – 2015-04-25 (×5): 750 mg via INTRAVENOUS
  Filled 2015-04-23 (×7): qty 150

## 2015-04-23 MED ORDER — QUETIAPINE FUMARATE ER 300 MG PO TB24
300.0000 mg | ORAL_TABLET | Freq: Every day | ORAL | Status: DC
  Administered 2015-04-23 – 2015-04-25 (×3): 300 mg via ORAL
  Filled 2015-04-23 (×4): qty 1

## 2015-04-23 MED ORDER — AZTREONAM 1 G IJ SOLR
1.0000 g | Freq: Three times a day (TID) | INTRAMUSCULAR | Status: DC
  Administered 2015-04-24 – 2015-04-25 (×5): 1 g via INTRAVENOUS
  Filled 2015-04-23 (×7): qty 1

## 2015-04-23 MED ORDER — POTASSIUM CHLORIDE 10 MEQ/100ML IV SOLN
10.0000 meq | INTRAVENOUS | Status: AC
  Administered 2015-04-23 (×4): 10 meq via INTRAVENOUS
  Filled 2015-04-23 (×5): qty 100

## 2015-04-23 MED ORDER — IOHEXOL 300 MG/ML  SOLN
100.0000 mL | Freq: Once | INTRAMUSCULAR | Status: AC | PRN
  Administered 2015-04-23: 80 mL via INTRAVENOUS

## 2015-04-23 MED ORDER — ACETAMINOPHEN 650 MG RE SUPP: 650.0000 mg | Freq: Four times a day (QID) | RECTAL | Status: DC | PRN

## 2015-04-23 MED ORDER — SENNA 8.6 MG PO TABS: 1.0000 | ORAL_TABLET | Freq: Two times a day (BID) | ORAL | Status: DC | PRN

## 2015-04-23 MED ORDER — ACETAMINOPHEN 325 MG PO TABS: 650.0000 mg | ORAL_TABLET | Freq: Four times a day (QID) | ORAL | Status: DC | PRN

## 2015-04-23 MED ORDER — CYCLOBENZAPRINE HCL 5 MG PO TABS
5.0000 mg | ORAL_TABLET | Freq: Three times a day (TID) | ORAL | Status: DC | PRN
  Administered 2015-04-23 – 2015-04-25 (×6): 5 mg via ORAL
  Filled 2015-04-23 (×6): qty 1

## 2015-04-23 MED ORDER — CITALOPRAM HYDROBROMIDE 40 MG PO TABS
40.0000 mg | ORAL_TABLET | Freq: Every day | ORAL | Status: DC
  Administered 2015-04-23 – 2015-04-26 (×4): 40 mg via ORAL
  Filled 2015-04-23 (×5): qty 1

## 2015-04-23 MED ORDER — DIPHENHYDRAMINE HCL 25 MG PO CAPS: 25.0000 mg | ORAL_CAPSULE | Freq: Every evening | ORAL | Status: DC | PRN

## 2015-04-23 MED ORDER — ONDANSETRON HCL 4 MG PO TABS: 4.0000 mg | ORAL_TABLET | Freq: Four times a day (QID) | ORAL | Status: DC | PRN

## 2015-04-23 MED ORDER — ONDANSETRON HCL 4 MG/2ML IJ SOLN: 4.0000 mg | Freq: Four times a day (QID) | INTRAMUSCULAR | Status: DC | PRN

## 2015-04-23 MED ORDER — OXYCODONE HCL 5 MG PO TABS
15.0000 mg | ORAL_TABLET | Freq: Four times a day (QID) | ORAL | Status: DC | PRN
  Administered 2015-04-24 – 2015-04-26 (×8): 15 mg via ORAL
  Filled 2015-04-23 (×9): qty 3

## 2015-04-23 MED ORDER — ZOLPIDEM TARTRATE 5 MG PO TABS
5.0000 mg | ORAL_TABLET | Freq: Every evening | ORAL | Status: DC | PRN
  Administered 2015-04-23 – 2015-04-24 (×2): 5 mg via ORAL
  Filled 2015-04-23 (×2): qty 1

## 2015-04-23 MED ORDER — POLYETHYLENE GLYCOL 3350 17 G PO PACK: 17.0000 g | PACK | Freq: Every day | ORAL | Status: DC | PRN

## 2015-04-23 MED ORDER — IOHEXOL 300 MG/ML  SOLN
50.0000 mL | Freq: Once | INTRAMUSCULAR | Status: AC | PRN
  Administered 2015-04-23: 50 mL via ORAL

## 2015-04-23 MED ORDER — VANCOMYCIN HCL IN DEXTROSE 1-5 GM/200ML-% IV SOLN
1000.0000 mg | Freq: Once | INTRAVENOUS | Status: AC
  Administered 2015-04-23: 1000 mg via INTRAVENOUS
  Filled 2015-04-23: qty 200

## 2015-04-23 MED ORDER — LEVOFLOXACIN IN D5W 750 MG/150ML IV SOLN
750.0000 mg | Freq: Once | INTRAVENOUS | Status: AC
  Administered 2015-04-23: 750 mg via INTRAVENOUS
  Filled 2015-04-23: qty 150

## 2015-04-23 MED ORDER — SODIUM CHLORIDE 0.9 % IV BOLUS (SEPSIS)
1000.0000 mL | INTRAVENOUS | Status: AC
  Administered 2015-04-23 (×2): 1000 mL via INTRAVENOUS

## 2015-04-23 MED ORDER — OXYCODONE HCL 5 MG PO TABS
15.0000 mg | ORAL_TABLET | Freq: Once | ORAL | Status: AC
  Administered 2015-04-23: 15 mg via ORAL
  Filled 2015-04-23: qty 3

## 2015-04-23 MED ORDER — HEPARIN SODIUM (PORCINE) 5000 UNIT/ML IJ SOLN
5000.0000 [IU] | Freq: Three times a day (TID) | INTRAMUSCULAR | Status: DC
  Administered 2015-04-23 – 2015-04-26 (×9): 5000 [IU] via SUBCUTANEOUS
  Filled 2015-04-23 (×12): qty 1

## 2015-04-23 MED ORDER — BISACODYL 10 MG RE SUPP: 10.0000 mg | Freq: Every day | RECTAL | Status: DC | PRN

## 2015-04-23 NOTE — H&P (Signed)
Patient Demographics  [name], is a 29 y.o. female  MRN: [id number]   DOB - 1986-05-18  Admit Date - 04/23/2015  Outpatient Primary MD for the patient is Pcp Not In System   With History of -  Past Medical History  Diagnosis Date  . Hypertension   . Heart murmur   . Seizures   . Cocaine abuse 01/06/2015  . Hepatitis C, chronic       Past Surgical History  Procedure Laterality Date  . No past surgeries    . I&d extremity Right 01/07/2015    Procedure: IRRIGATION AND DEBRIDEMENT EXTREMITY;  Surgeon: Dominica SeverinWilliam Gramig, MD;  [facility];  Service: Orthopedics;  Laterality: Right;    in for   Chief Complaint  Patient presents with  . Flank Pain     HPI  [name]  is a 29 y.o. female, with past medical history of hypertension, IVDU, recently discharged from [facility]/15 for supportive thrombophlebitis on IV antibiotics on facility, patient reports was discharged from nursing home in April, presents with complaints of fever, chills, cough, productive sputum, flank pain, NAD patient was found to be febrile 101.3, leukocytosis 17.9, CT abdomen with no acute findings and abdomen, but significant for left base infiltrate, and developing right base pneumonia, urinalysis was contaminated, patient denies any IV drug abuse over last 4 month, started on IV vancomycin, Azactam, levofloxacin in ED, after blood cultures were sent, denies any chest pain, shortness of breath, dysuria or polyuria, hospitalist requested to admit the patient.    Review of Systems    In addition to the HPI above,  Reports Fever-chills, No Headache, No changes with Vision or hearing, No problems swallowing food or Liquids, No Chest pain, but reports Cough denies Shortness of Breath, No Abdominal pain, No Nausea or Vommitting, Bowel movements are regular, No Blood in stool or Urine, No dysuria, No new skin rashes or bruises, No new joints pains-aches,  No new weakness, tingling,  numbness in any extremity, No recent weight gain or loss, No polyuria, polydypsia or polyphagia, No significant Mental Stressors.  A full 10 point Review of Systems was done, except as stated above, all other Review of Systems were negative.   Social History History  Substance Use Topics  . Smoking status: Current Every Day Smoker -- 0.50 packs/day    Types: Cigarettes  . Smokeless tobacco: Not on file  . Alcohol Use: No     Comment: patient says she quit alcohol 1 month ago    Family History Family History  Problem Relation Age of Onset  . Cancer Mother   . Diabetes Mother   . Hypertension Father      Prior to Admission medications   Medication Sig Start Date End Date Taking? Authorizing Provider  citalopram (CELEXA) 40 MG tablet Take 1 tablet (40 mg total) by mouth daily. 11/27/14  Yes Bethann BerkshireJoseph Zammit, MD  cyclobenzaprine (FLEXERIL) 5 MG tablet Take 1 tablet (5 mg total) by mouth 3 (three) times daily as needed for muscle spasms. 01/16/15  Yes Alexa Dulcy FannyM Richardson, MD  diphenhydrAMINE (BENADRYL) 25 mg capsule Take 1 capsule (25 mg total) by mouth at bedtime as needed for itching or sleep. 01/16/15  Yes Alexa Dulcy FannyM Richardson, MD  gabapentin (NEURONTIN) 300 MG capsule Take 600 mg by mouth 4 (four) times daily.   Yes Historical Provider, MD  pregabalin (LYRICA) 50 MG capsule Take 1 capsule (50 mg total) by mouth 3 (three) times daily.  01/16/15  Yes Alexa Dulcy Fanny, MD  QUEtiapine (SEROQUEL XR) 300 MG 24 hr tablet Take 300 mg by mouth at bedtime.   Yes Historical Provider, MD  senna (SENOKOT) 8.6 MG TABS tablet Take 1 tablet (8.6 mg total) by mouth 2 (two) times daily as needed for mild constipation. 01/16/15  Yes Alexa Dulcy Fanny, MD  zolpidem (AMBIEN) 5 MG tablet Take 1 tablet (5 mg total) by mouth at bedtime as needed for sleep. 01/16/15  Yes Alexa Dulcy Fanny, MD    Allergies  Allergen Reactions  . Amoxicillin Other (See Comments)    diarrhea  . Penicillins Cross Reactors Hives,  Nausea And Vomiting and Swelling    Physical Exam  Vitals  Blood pressure 124/76, pulse 98, temperature 99 F (37.2 C), temperature source Oral, resp. rate 29, height 5' (1.524 m), weight 52.164 kg (115 lb), last menstrual period 04/19/2015, SpO2 99 %.   1. General thin appearing female lying in bed in NAD,    2. Normal affect and insight, Not Suicidal or Homicidal, Awake Alert, Oriented X 3.  3. No F.N deficits, ALL C.Nerves Intact, Strength 5/5 all 4 extremities, Sensation intact all 4 extremities, Plantars down going.  4. Ears and Eyes appear Normal, Conjunctivae clear, PERRLA. Moist Oral Mucosa.  5. Supple Neck, No JVD, No cervical lymphadenopathy appriciated, No Carotid Bruits.  6. Symmetrical Chest wall movement, Good air movement bilaterally, bibasilar.  7. RRR, No Gallops, Rubs or Murmurs, No Parasternal Heave.  8. Positive Bowel Sounds, Abdomen Soft, No tenderness, no CVA tenderness (pain was more in the left mid chest area) No organomegaly appriciated,No rebound -guarding or rigidity.  9.  No Cyanosis, Normal Skin Turgor, No Skin Rash or Bruise.  10. Good muscle tone,  joints appear normal , no effusions, Normal ROM.  11. No Palpable Lymph Nodes in Neck or Axillae    Data Review  CBC  Recent Labs Lab 04/23/15 1300  WBC 17.9*  HGB 15.1*  HCT 43.8  PLT 186  MCV 86.7  MCH 29.9  MCHC 34.5  RDW 14.1  LYMPHSABS 0.7  MONOABS 1.4*  EOSABS 0.0  BASOSABS 0.0   ------------------------------------------------------------------------------------------------------------------  Chemistries   Recent Labs Lab 04/23/15 1300  NA 136  K 2.9*  CL 103  CO2 22  GLUCOSE 190*  BUN 9  CREATININE 0.68  CALCIUM 8.9  AST 39  ALT 48  ALKPHOS 58  BILITOT 0.9   ------------------------------------------------------------------------------------------------------------------ estimated creatinine clearance is 73.9 mL/min (by C-G formula based on Cr of  0.68). ------------------------------------------------------------------------------------------------------------------ No results for input(s): TSH, T4TOTAL, T3FREE, THYROIDAB in the last 72 hours.  Invalid input(s): FREET3   Coagulation profile No results for input(s): INR, PROTIME in the last 168 hours. ------------------------------------------------------------------------------------------------------------------- No results for input(s): DDIMER in the last 72 hours. -------------------------------------------------------------------------------------------------------------------  Cardiac Enzymes No results for input(s): CKMB, TROPONINI, MYOGLOBIN in the last 168 hours.  Invalid input(s): CK ------------------------------------------------------------------------------------------------------------------ Invalid input(s): POCBNP   ---------------------------------------------------------------------------------------------------------------  Urinalysis    Component Value Date/Time   COLORURINE AMBER* 04/23/2015 1238   APPEARANCEUR CLOUDY* 04/23/2015 1238   LABSPEC 1.019 04/23/2015 1238   PHURINE 6.5 04/23/2015 1238   GLUCOSEU 250* 04/23/2015 1238   HGBUR NEGATIVE 04/23/2015 1238   BILIRUBINUR NEGATIVE 04/23/2015 1238   KETONESUR NEGATIVE 04/23/2015 1238   PROTEINUR 30* 04/23/2015 1238   UROBILINOGEN 1.0 04/23/2015 1238   NITRITE NEGATIVE 04/23/2015 1238   LEUKOCYTESUR NEGATIVE 04/23/2015 1238    ----------------------------------------------------------------------------------------------------------------  Imaging results:   Ct Abdomen Pelvis W Contrast  04/23/2015   CLINICAL DATA:  29 year old female with flank pain during urination yesterday. Febrile since yesterday with fever 101.3.  EXAM: CT ABDOMEN AND PELVIS WITH CONTRAST  TECHNIQUE: Multidetector CT imaging of the abdomen and pelvis was performed using the standard protocol following bolus administration  of intravenous contrast.  CONTRAST:  80mL OMNIPAQUE IOHEXOL 300 MG/ML  SOLN  COMPARISON:  CT of the abdomen and pelvis 10/16/2014  FINDINGS: Lower chest: Extensive airspace consolidation in the posterior aspect of the left lower lobe. Small focus of nodular airspace consolidation in the right lower lobe as well, presumably from endobronchial spread of infection.  Hepatobiliary: No cystic or solid hepatic lesions. No intra or extrahepatic biliary ductal dilatation. Mild diffuse periportal edema. Gallbladder is nearly completely decompressed, but otherwise unremarkable in appearance.  Pancreas: No pancreatic mass. No pancreatic ductal dilatation. No pancreatic or peripancreatic fluid or inflammatory changes.  Spleen: Unremarkable.  Adrenals/Urinary Tract: Sub cm low-attenuation lesions in the kidneys bilaterally are too small to definitively characterize, but are statistically favored to represent tiny cysts. Enhancement pattern of the kidneys is normal bilaterally. No hydroureteronephrosis or perinephric stranding. Urinary bladder is normal in appearance. Bilateral adrenal glands are normal in appearance.  Stomach/Bowel: The appearance of the stomach is normal. No pathologic dilatation of small bowel or colon. Normal appendix.  Vascular/Lymphatic: No atherosclerotic disease, aneurysm or dissection identified in the abdominal or pelvic vasculature. No lymphadenopathy noted in the abdomen or pelvis.  Reproductive: Uterus is retroverted. Ovaries are unremarkable in appearance.  Other: No significant volume of ascites.  No pneumoperitoneum.  Musculoskeletal: There are no aggressive appearing lytic or blastic lesions noted in the visualized portions of the skeleton.  IMPRESSION: 1. Left lower lobe pneumonia with early endobronchial spread of infection into the right lower lobe. 2. No acute findings in the abdomen or pelvis to account for the patient's symptoms. 3. Normal appendix.   Electronically Signed   By: Trudie Reed M.D.   On: 04/23/2015 16:24   Dg Chest Port 1 View  04/23/2015   CLINICAL DATA:  Fever.  Current smoker.  EXAM: PORTABLE CHEST - 1 VIEW  COMPARISON:  01/06/2015; 07/23/2013  FINDINGS: Grossly unchanged cardiac silhouette and mediastinal contours. Interval development of ill-defined retrocardiac heterogeneous airspace opacities. No pleural effusion pneumothorax. No evidence of edema. No acute osseus abnormalities.  IMPRESSION: Findings worrisome for developing left lower lung pneumonia. Further evaluation with a PA and lateral chest radiograph may be obtained as clinically indicated.   Electronically Signed   By: Simonne Come M.D.   On: 04/23/2015 13:51        Assessment & Plan  Active Problems:   IVDU (intravenous drug user)   Hepatitis C, chronic   Sepsis   HCAP (healthcare-associated pneumonia)   Hypokalemia   Sepsis - This is most likely in the setting of healthcare acquired pneumonia giving patient the recent SNF stay. - Continue with IV vancomycin and aztreonam. - Follow blood cultures, sputum cultures, legionella, strep B, influenza panel. - Continue with IV fluids  Hypokalemia - Repleted, recheck in a.m.  Hepatitis C chronic - LFTs within normal limits  History of IVDU -Patient denies any further use since February 2016    DVT Prophylaxis Heparin -   AM Labs Ordered, also please review Full Orders  Family Communication: Admission, patients condition and plan of care including tests being ordered have been discussed with the patient  who indicate understanding and agree with the plan and Code Status.  Code Status Full  Likely DC to  Home  Condition GUARDED   Time spent in minutes : 50 minutes    Abundio Teuscher M.D on 04/23/2015 at 5:42 PM  Between 7am to 7pm - Pager - [phone]  After 7pm go to [email or web address] - password [email or web address]  And look for the night coverage person covering me after hours  [facility] Office  [phone]

## 2015-04-23 NOTE — ED Notes (Signed)
350 cc of urine

## 2015-04-23 NOTE — ED Notes (Addendum)
C/o flank pain with urination onset yesterday. Patient appears ill, has been febrile since yesterday. Temp of 101.3 tympanic per EMS, given 1 gram of acetaminophen enroute.

## 2015-04-23 NOTE — ED Notes (Signed)
Bed: [id number] Expected date:  Expected time:  Means of arrival:  Comments: 29 yo flank pain

## 2015-04-23 NOTE — ED Notes (Signed)
Patient states unable to urinate.

## 2015-04-23 NOTE — ED Provider Notes (Signed)
CSN: [id number]     Arrival date & time 04/23/15  1216 History   First MD Initiated Contact with Patient 04/23/15 1226     Chief Complaint  Patient presents with  . Flank Pain     Patient is a 29 y.o. female presenting with flank pain. The history is provided by the patient. No language interpreter was used.  Flank Pain   Ms. Ocanas presents for evaluation of flak pain.  She developed left sided flank pain yesterday.  It is sharp and constant in nature.  She has associated vomiting (yesterday), fevers since yesterday, dysuria.  She denies any constipation, diarrhea, vaginal discharge.  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  Sxs are severe, constant, worsening.  She has associated cough and SOB.  Past Medical History  Diagnosis Date  . Hypertension   . Heart murmur   . Seizures   . Cocaine abuse 01/06/2015  . Hepatitis C, chronic    Past Surgical History  Procedure Laterality Date  . No past surgeries    . I&d extremity Right 01/07/2015    Procedure: IRRIGATION AND DEBRIDEMENT EXTREMITY;  Surgeon: Dominica Severin, MD;  Location: [facility] OR;  Service: Orthopedics;  Laterality: Right;   Family History  Problem Relation Age of Onset  . Cancer Mother   . Diabetes Mother   . Hypertension Father    History  Substance Use Topics  . Smoking status: Current Every Day Smoker -- 0.50 packs/day    Types: Cigarettes  . Smokeless tobacco: Not on file  . Alcohol Use: No     Comment: patient says she quit alcohol 1 month ago   OB History    Gravida Para Term Preterm AB TAB SAB Ectopic Multiple Living   Review of Systems  Genitourinary: Positive for flank pain.  All other systems reviewed and are negative.     Allergies  Amoxicillin and Penicillins cross reactors  Home Medications   Prior to Admission medications   Medication Sig Start Date End Date Taking? Authorizing Provider  ceFAZolin (ANCEF) 1-5 GM-% Inject 50 mLs (1 g total) into  the vein every 8 (eight) hours. 01/16/15   Alexa Dulcy Fanny, MD  citalopram (CELEXA) 40 MG tablet Take 1 tablet (40 mg total) by mouth daily. 11/27/14   Bethann Berkshire, MD  cyclobenzaprine (FLEXERIL) 5 MG tablet Take 1 tablet (5 mg total) by mouth 3 (three) times daily as needed for muscle spasms. 01/16/15   Alexa Dulcy Fanny, MD  diphenhydrAMINE (BENADRYL) 25 mg capsule Take 1 capsule (25 mg total) by mouth at bedtime as needed for itching or sleep. 01/16/15   Alexa Dulcy Fanny, MD  ibuprofen (ADVIL,MOTRIN) 400 MG tablet Take 1 tablet (400 mg total) by mouth every 6 (six) hours as needed for moderate pain. 01/16/15   Alexa Dulcy Fanny, MD  oxyCODONE (ROXICODONE) 15 MG immediate release tablet Take 1 tablet (15 mg total) by mouth every 4 (four) hours as needed. 01/22/15   Kimber Relic, MD  pregabalin (LYRICA) 50 MG capsule Take 1 capsule (50 mg total) by mouth 3 (three) times daily. 01/16/15   Alexa Dulcy Fanny, MD  senna (SENOKOT) 8.6 MG TABS tablet Take 1 tablet (8.6 mg total) by mouth 2 (two) times daily as needed for mild constipation. 01/16/15   Alexa Dulcy Fanny, MD  valACYclovir (VALTREX) 1000 MG tablet Take 1 tablet (1,000 mg total)  by mouth every 8 (eight) hours. 01/16/15   Alexa Dulcy FannyM Richardson, MD  zolpidem (AMBIEN) 5 MG tablet Take 1 tablet (5 mg total) by mouth at bedtime as needed for sleep. 01/16/15   Alexa Dulcy FannyM Richardson, MD   BP 134/80 mmHg  Pulse 119  Temp(Src) 102 F (38.9 C) (Oral)  Resp 22  Ht 5' (1.524 m)  Wt 115 lb (52.164 kg)  BMI 22.46 kg/m2  SpO2 97% Physical Exam  Constitutional: She is oriented to person, place, and time. She appears well-developed and well-nourished. She appears distressed.  Ill appearing  HENT:  Head: Normocephalic and atraumatic.  Cardiovascular: Regular rhythm.   No murmur heard. tachycardic  Pulmonary/Chest: No respiratory distress.  Tachypnea, crackles in the right lung base  Abdominal: Soft. There is no rebound and no guarding.  Mild diffuse  abdominal tenderness, left cva tenderness  Musculoskeletal: She exhibits no edema or tenderness.  Neurological: She is alert and oriented to person, place, and time.  Skin: Skin is warm and dry.  Psychiatric: She has a normal mood and affect. Her behavior is normal.  Nursing note and vitals reviewed.   ED Course  Procedures (including critical care time) Labs Review Labs Reviewed  COMPREHENSIVE METABOLIC PANEL - Abnormal; Notable for the following:    Potassium 2.9 (*)    Glucose, Bld 190 (*)    All other components within normal limits  CBC WITH DIFFERENTIAL/PLATELET - Abnormal; Notable for the following:    WBC 17.9 (*)    Hemoglobin 15.1 (*)    Neutrophils Relative % 88 (*)    Neutro Abs 15.7 (*)    Lymphocytes Relative 4 (*)    Monocytes Absolute 1.4 (*)    All other components within normal limits  URINALYSIS, ROUTINE W REFLEX MICROSCOPIC (NOT AT [facility]) - Abnormal; Notable for the following:    Color, Urine AMBER (*)    APPearance CLOUDY (*)    Glucose, UA 250 (*)    Protein, ur 30 (*)    All other components within normal limits  URINE MICROSCOPIC-ADD ON - Abnormal; Notable for the following:    Squamous Epithelial / LPF FEW (*)    Bacteria, UA FEW (*)    All other components within normal limits  I-STAT CG4 LACTIC ACID, ED - Abnormal; Notable for the following:    Lactic Acid, Venous 2.84 (*)    All other components within normal limits  CULTURE, BLOOD (ROUTINE X 2)  CULTURE, BLOOD (ROUTINE X 2)  URINE CULTURE  PREGNANCY, URINE  I-STAT CG4 LACTIC ACID, ED  I-STAT CG4 LACTIC ACID, ED    Imaging Review Ct Abdomen Pelvis W Contrast  04/23/2015   CLINICAL DATA:  29 year old female with flank pain during urination yesterday. Febrile since yesterday with fever 101.3.  EXAM: CT ABDOMEN AND PELVIS WITH CONTRAST  TECHNIQUE: Multidetector CT imaging of the abdomen and pelvis was performed using the standard protocol following bolus administration of intravenous contrast.   CONTRAST:  80mL OMNIPAQUE IOHEXOL 300 MG/ML  SOLN  COMPARISON:  CT of the abdomen and pelvis 10/16/2014  FINDINGS: Lower chest: Extensive airspace consolidation in the posterior aspect of the left lower lobe. Small focus of nodular airspace consolidation in the right lower lobe as well, presumably from endobronchial spread of infection.  Hepatobiliary: No cystic or solid hepatic lesions. No intra or extrahepatic biliary ductal dilatation. Mild diffuse periportal edema. Gallbladder is nearly completely decompressed, but otherwise unremarkable in appearance.  Pancreas: No pancreatic mass. No pancreatic ductal dilatation.  No pancreatic or peripancreatic fluid or inflammatory changes.  Spleen: Unremarkable.  Adrenals/Urinary Tract: Sub cm low-attenuation lesions in the kidneys bilaterally are too small to definitively characterize, but are statistically favored to represent tiny cysts. Enhancement pattern of the kidneys is normal bilaterally. No hydroureteronephrosis or perinephric stranding. Urinary bladder is normal in appearance. Bilateral adrenal glands are normal in appearance.  Stomach/Bowel: The appearance of the stomach is normal. No pathologic dilatation of small bowel or colon. Normal appendix.  Vascular/Lymphatic: No atherosclerotic disease, aneurysm or dissection identified in the abdominal or pelvic vasculature. No lymphadenopathy noted in the abdomen or pelvis.  Reproductive: Uterus is retroverted. Ovaries are unremarkable in appearance.  Other: No significant volume of ascites.  No pneumoperitoneum.  Musculoskeletal: There are no aggressive appearing lytic or blastic lesions noted in the visualized portions of the skeleton.  IMPRESSION: 1. Left lower lobe pneumonia with early endobronchial spread of infection into the right lower lobe. 2. No acute findings in the abdomen or pelvis to account for the patient's symptoms. 3. Normal appendix.   Electronically Signed   By: Trudie Reed M.D.   On:  04/23/2015 16:24   Dg Chest Port 1 View  04/23/2015   CLINICAL DATA:  Fever.  Current smoker.  EXAM: PORTABLE CHEST - 1 VIEW  COMPARISON:  01/06/2015; 07/23/2013  FINDINGS: Grossly unchanged cardiac silhouette and mediastinal contours. Interval development of ill-defined retrocardiac heterogeneous airspace opacities. No pleural effusion pneumothorax. No evidence of edema. No acute osseus abnormalities.  IMPRESSION: Findings worrisome for developing left lower lung pneumonia. Further evaluation with a PA and lateral chest radiograph may be obtained as clinically indicated.   Electronically Signed   By: Simonne Come M.D.   On: 04/23/2015 13:51     EKG Interpretation None      MDM   Final diagnoses:  HCAP (healthcare-associated pneumonia)    Patient here for evaluation of left flank pain, fevers. Patient is septic and ill appearing on examination. Chest x-ray is concerning for pneumonia. CT abdomen obtained given abdominal tenderness and abdominal pain, CT abdomen redemonstrates pneumonia found on chest x-ray, no additional significant abnormalities. Discussed with patient regarding need for admission for IV antibiotics. Discussed with hospitalist regarding admission for further management. Lactate did clear with IV fluids.    Tilden Fossa, MD 04/23/15 1630

## 2015-04-23 NOTE — ED Notes (Signed)
Initial doses of vancomycin and aztreonam infused.  0.9% NaCl bolus in progress. Patient in no acute distress, sleeping but arouses to verbal stimuli.

## 2015-04-23 NOTE — Progress Notes (Signed)
Utilization Review completed.  Mckay Brandt RN CM  

## 2015-04-23 NOTE — ED Notes (Signed)
Called report to RN Irving Burtonmily for pt transfer to 5E.  Per RN request, hung last dose of Potassium prior to transfer.

## 2015-04-23 NOTE — ED Notes (Signed)
Patient resting with eyes closed. Had ice chips, no emesis.  Is requesting apple sauce "I will eat it slow".

## 2015-04-23 NOTE — Progress Notes (Signed)
EDCM spoke to patient at bedside. Patient confirms she [place] a pcp [place].  [facility], informed patient of services there and walk in times.  EDCM also provided patient with list of pcps who accept self pay patients, list of discount pharmacies and websites needymeds.org and GoodRX.com for medication assistance, phone number to inquire about the orange card, phone number to inquire about Medic[employer]ad, phone number to inquire about the Affordable Care Act, financial resources in the community such as local churches, [employer], urban ministries, and dental assistance for uninsured patients.  EDCM placed resources in belongings bag and placed on patient's bed.  Patient was not awake enough to review resources.  EDCM did not wake patient at this time.  No further EDCM needs at this time.

## 2015-04-23 NOTE — Progress Notes (Signed)
ANTIBIOTIC CONSULT NOTE - INITIAL  Pharmacy Consult for Vancomycin, Azactam, Levaquin Indication: rule out sepsis  Allergies  Allergen Reactions  . Amoxicillin Other (See Comments)    diarrhea  . Penicillins Cross Reactors Hives, Nausea And Vomiting and Swelling   Patient Measurements: Height: 5' (152.4 cm) Weight: 115 lb (52.164 kg) IBW/kg (Calculated) : 45.5  Vital Signs: Temp: 102 F (38.9 C) (06/08 1218) Temp Source: Oral (06/08 1218) BP: 134/80 mmHg (06/08 1218) Pulse Rate: 119 (06/08 1218) Intake/Output from previous day:   Intake/Output from this shift:    Labs: No results for input(s): WBC, HGB, PLT, LABCREA, CREATININE in the last 72 hours. CrCl cannot be calculated (Patient has no serum creatinine result on file.). No results for input(s): VANCOTROUGH, VANCOPEAK, VANCORANDOM, GENTTROUGH, GENTPEAK, GENTRANDOM, TOBRATROUGH, TOBRAPEAK, TOBRARND, AMIKACINPEAK, AMIKACINTROU, AMIKACIN in the last 72 hours.   Microbiology: No results found for this or any previous visit (from the past 720 hour(s)).  Medical History: Past Medical History  Diagnosis Date  . Hypertension   . Heart murmur   . Seizures   . Cocaine abuse 01/06/2015  . Hepatitis C, chronic     Medications:  Anti-infectives    Start     Dose/Rate Route Frequency Ordered Stop   04/23/15 1245  levofloxacin (LEVAQUIN) IVPB 750 mg     750 mg 100 mL/hr over 90 Minutes Intravenous  Once 04/23/15 1239     04/23/15 1245  aztreonam (AZACTAM) 2 g in dextrose 5 % 50 mL IVPB     2 g 100 mL/hr over 30 Minutes Intravenous  Once 04/23/15 1239     04/23/15 1245  vancomycin (VANCOCIN) IVPB 1000 mg/200 mL premix     1,000 mg 200 mL/hr over 60 Minutes Intravenous  Once 04/23/15 1239       Assessment: 29yo F w/ fever, dysuria, flank pain, cough, SOB, and nausea/vomiting. Pharmacy asked to dose broad-spectrum antibiotics for suspected sepsis. First-doses of Vanc, Aztreonam, and Levaquin were ordered in the ED. SCr  wnl, CrCl >100.  Goal of Therapy:  Vancomycin trough level 15-20 mcg/ml  Appropriate antibiotic dosing for renal function; eradication of infection  Plan:  Vancomycin 750mg  IV q8h. Azactam 1g IV q8h. Levaquin 750mg  IV q24h. Measure Vanc trough at steady state. Follow up renal fxn, culture results, and clinical course.  Charolotte Ekeom Findley Blankenbaker, PharmD, pager [phone]. 04/23/2015,12:57 PM.

## 2015-04-23 NOTE — ED Notes (Signed)
Elevated lactic given to Dr Rees...klj 

## 2015-04-24 DIAGNOSIS — F1990 Other psychoactive substance use, unspecified, uncomplicated: Secondary | ICD-10-CM

## 2015-04-24 LAB — CBC
HCT: 36 % (ref 36.0–46.0)
Hemoglobin: 11.8 g/dL — ABNORMAL LOW (ref 12.0–15.0)
MCH: 28.2 pg (ref 26.0–34.0)
MCHC: 32.8 g/dL (ref 30.0–36.0)
MCV: 86.1 fL (ref 78.0–100.0)
PLATELETS: 145 10*3/uL — AB (ref 150–400)
RBC: 4.18 MIL/uL (ref 3.87–5.11)
RDW: 14.1 % (ref 11.5–15.5)
WBC: 8.5 10*3/uL (ref 4.0–10.5)

## 2015-04-24 LAB — BASIC METABOLIC PANEL
Anion gap: 5 (ref 5–15)
BUN: 9 mg/dL (ref 6–20)
CO2: 24 mmol/L (ref 22–32)
Calcium: 8.1 mg/dL — ABNORMAL LOW (ref 8.9–10.3)
Chloride: 113 mmol/L — ABNORMAL HIGH (ref 101–111)
Creatinine, Ser: 0.56 mg/dL (ref 0.44–1.00)
GFR calc non Af Amer: >60 mL/min (ref >60)
Glucose, Bld: 123 mg/dL — ABNORMAL HIGH (ref 65–99)
Potassium: 3.3 mmol/L — ABNORMAL LOW (ref 3.5–5.1)
SODIUM: 142 mmol/L (ref 135–145)

## 2015-04-24 LAB — INFLUENZA PANEL BY PCR (TYPE A & B)
H1N1 flu by pcr: NOT DETECTED
Influenza A By PCR: NEGATIVE
Influenza B By PCR: NEGATIVE

## 2015-04-24 LAB — URINE CULTURE

## 2015-04-24 LAB — HIV ANTIBODY (ROUTINE TESTING W REFLEX): HIV Screen 4th Generation wRfx: NONREACTIVE

## 2015-04-24 MED ORDER — POTASSIUM CHLORIDE CRYS ER 20 MEQ PO TBCR
40.0000 meq | EXTENDED_RELEASE_TABLET | Freq: Once | ORAL | Status: AC
  Administered 2015-04-24: 40 meq via ORAL
  Filled 2015-04-24: qty 2

## 2015-04-24 NOTE — Progress Notes (Signed)
PT Cancellation Note  Patient Details Name: [name] MRN: [id number] DOB: 1985-11-25   Cancelled Treatment:    Reason Eval/Treat Not Completed: PT screened, no needs identified, will sign off (patient reports that she is independent and declines PT)   Rada Hay 04/24/2015, 10:52 AM Blanchard Kelch PT [phone]

## 2015-04-24 NOTE — Progress Notes (Signed)
Patient Demographics  [name], is a 29 y.o. female, DOB - 05/09/1986, GMW:[id number]  Admit date - 04/23/2015   Admitting Physician Starleen Arms, MD  Outpatient Primary MD for the patient is Pcp Not In System  LOS - 1   Chief Complaint  Patient presents with  . Flank Pain       Admission HPI/Brief narrative: 29 year old female with history of hypertension, IVDU, being treated for HCAP, continues to improve.  Subjective:   [name] today has, No headache, No chest pain, No abdominal pain - No Nausea, No new weakness tingling or numbness, still complaining of cough, shortness of breath improved.  Assessment & Plan    Active Problems:   IVDU (intravenous drug user)   Hepatitis C, chronic   Sepsis   HCAP (healthcare-associated pneumonia)   Hypokalemia   Sepsis secondary to HCAP - Continue with IV vancomycin and aztreonam 04/23/15 -  negative influenza panel, negative strep pneumonia , Legionella antigen pending  -  blood cultures no growth to date    Hypokalemia - Repleted, recheck in a.m.  Hepatitis C chronic - LFTs within normal limits,    IVDU - UDS positive for cocaine, she was counseled.   Code Status: full  Family Communication: none at bedside  Disposition Plan: home in 24-48 hrs   Procedures  none   Consults   none   Medications  Scheduled Meds: . aztreonam  1 g Intravenous Q8H  . citalopram  40 mg Oral Daily  . gabapentin  600 mg Oral QID  . heparin  5,000 Units Subcutaneous 3 times per day  . potassium chloride  40 mEq Oral Once  . QUEtiapine  300 mg Oral QHS  . sodium chloride  3 mL Intravenous Q12H  . vancomycin  750 mg Intravenous Q8H   Continuous Infusions: . sodium chloride 100 mL/hr at 04/24/15 0827   PRN Meds:.acetaminophen **OR** acetaminophen, bisacodyl, cyclobenzaprine, diphenhydrAMINE, ondansetron **OR** ondansetron  (ZOFRAN) IV, oxyCODONE, polyethylene glycol, senna, zolpidem  DVT Prophylaxis Heparin -   Lab Results  Component Value Date   PLT 145* 04/24/2015    Antibiotics    Anti-infectives    Start     Dose/Rate Route Frequency Ordered Stop   04/24/15 1200  levofloxacin (LEVAQUIN) IVPB 750 mg  Status:  Discontinued     750 mg 100 mL/hr over 90 Minutes Intravenous Every 24 hours 04/23/15 1300 04/23/15 1753   04/23/15 2359  aztreonam (AZACTAM) 1 g in dextrose 5 % 50 mL IVPB     1 g 100 mL/hr over 30 Minutes Intravenous Every 8 hours 04/23/15 1300     04/23/15 2200  vancomycin (VANCOCIN) IVPB 750 mg/150 ml premix     750 mg 150 mL/hr over 60 Minutes Intravenous Every 8 hours 04/23/15 1300     04/23/15 1245  levofloxacin (LEVAQUIN) IVPB 750 mg     750 mg 100 mL/hr over 90 Minutes Intravenous  Once 04/23/15 1239 04/23/15 1620   04/23/15 1245  aztreonam (AZACTAM) 2 g in dextrose 5 % 50 mL IVPB     2 g 100 mL/hr over 30 Minutes Intravenous  Once 04/23/15 1239 04/23/15 1315   04/23/15 1245  vancomycin (VANCOCIN) IVPB 1000 mg/200 mL premix  1,000 mg 200 mL/hr over 60 Minutes Intravenous  Once 04/23/15 1239 04/23/15 1325          Objective:   Filed Vitals:   04/23/15 1859 04/23/15 2051 04/24/15 0410 04/24/15 0559  BP: 137/92 133/80 123/71 99/55  Pulse: 102 108 109 105  Temp: 98.4 F (36.9 C) 98.5 F (36.9 C) 99.1 F (37.3 C) 98 F (36.7 C)  TempSrc: Oral Oral Oral Oral  Resp: Height:      Weight:      SpO2: 100% 99% 98% 99%    Wt Readings from Last 3 Encounters:  04/23/15 52.164 kg (115 lb)  01/06/15 55.1 kg (121 lb 7.6 oz)  10/21/13 49.896 kg (110 lb)     Intake/Output Summary (Last 24 hours) at 04/24/15 1211 Last data filed at 04/24/15 0830  Gross per 24 hour  Intake   1655 ml  Output    250 ml  Net   1405 ml     Physical Exam  Awake Alert, Oriented X 3, No new F.N deficits, Normal affect [place].AT,PERRAL Supple Neck,No JVD, No cervical  lymphadenopathy appriciated.  Symmetrical Chest wall movement, Good air movement bilaterally, bibasilar Rales RRR,No Gallops,Rubs or new Murmurs, No Parasternal Heave +ve B.Sounds, Abd Soft, No tenderness, No organomegaly appriciated, No rebound - guarding or rigidity. No Cyanosis, Clubbing or edema, No new Rash or bruise     Data Review   Micro Results Recent Results (from the past 240 hour(s))  Blood Culture (routine x 2)     Status: None (Preliminary result)   Collection Time: 04/23/15  1:00 PM  Result Value Ref Range Status   Specimen Description BLOOD RIGHT HAND  Final   Special Requests BOTTLES DRAWN AEROBIC AND ANAEROBIC 5 CC EA  Final   Culture   Final           BLOOD CULTURE RECEIVED NO GROWTH TO DATE CULTURE WILL BE HELD FOR 5 DAYS BEFORE ISSUING A FINAL NEGATIVE REPORT Performed at [facility]    Report Status PENDING  Incomplete  Blood Culture (routine x 2)     Status: None (Preliminary result)   Collection Time: 04/23/15  1:03 PM  Result Value Ref Range Status   Specimen Description BLOOD BLOOD LEFT FOREARM  Final   Special Requests BOTTLES DRAWN AEROBIC AND ANAEROBIC 5 CC EA  Final   Culture   Final           BLOOD CULTURE RECEIVED NO GROWTH TO DATE CULTURE WILL BE HELD FOR 5 DAYS BEFORE ISSUING A FINAL NEGATIVE REPORT Performed at [facility]    Report Status PENDING  Incomplete    Radiology Reports Ct Abdomen Pelvis W Contrast  04/23/2015   CLINICAL DATA:  29 year old female with flank pain during urination yesterday. Febrile since yesterday with fever 101.3.  EXAM: CT ABDOMEN AND PELVIS WITH CONTRAST  TECHNIQUE: Multidetector CT imaging of the abdomen and pelvis was performed using the standard protocol following bolus administration of intravenous contrast.  CONTRAST:  80mL OMNIPAQUE IOHEXOL 300 MG/ML  SOLN  COMPARISON:  CT of the abdomen and pelvis 10/16/2014  FINDINGS: Lower chest: Extensive airspace consolidation in the posterior aspect of the  left lower lobe. Small focus of nodular airspace consolidation in the right lower lobe as well, presumably from endobronchial spread of infection.  Hepatobiliary: No cystic or solid hepatic lesions. No intra or extrahepatic biliary ductal dilatation. Mild diffuse periportal edema. Gallbladder is nearly completely decompressed, but  otherwise unremarkable in appearance.  Pancreas: No pancreatic mass. No pancreatic ductal dilatation. No pancreatic or peripancreatic fluid or inflammatory changes.  Spleen: Unremarkable.  Adrenals/Urinary Tract: Sub cm low-attenuation lesions in the kidneys bilaterally are too small to definitively characterize, but are statistically favored to represent tiny cysts. Enhancement pattern of the kidneys is normal bilaterally. No hydroureteronephrosis or perinephric stranding. Urinary bladder is normal in appearance. Bilateral adrenal glands are normal in appearance.  Stomach/Bowel: The appearance of the stomach is normal. No pathologic dilatation of small bowel or colon. Normal appendix.  Vascular/Lymphatic: No atherosclerotic disease, aneurysm or dissection identified in the abdominal or pelvic vasculature. No lymphadenopathy noted in the abdomen or pelvis.  Reproductive: Uterus is retroverted. Ovaries are unremarkable in appearance.  Other: No significant volume of ascites.  No pneumoperitoneum.  Musculoskeletal: There are no aggressive appearing lytic or blastic lesions noted in the visualized portions of the skeleton.  IMPRESSION: 1. Left lower lobe pneumonia with early endobronchial spread of infection into the right lower lobe. 2. No acute findings in the abdomen or pelvis to account for the patient's symptoms. 3. Normal appendix.   Electronically Signed   By: Trudie Reed M.D.   On: 04/23/2015 16:24   Dg Chest Port 1 View  04/23/2015   CLINICAL DATA:  Fever.  Current smoker.  EXAM: PORTABLE CHEST - 1 VIEW  COMPARISON:  01/06/2015; 07/23/2013  FINDINGS: Grossly unchanged cardiac  silhouette and mediastinal contours. Interval development of ill-defined retrocardiac heterogeneous airspace opacities. No pleural effusion pneumothorax. No evidence of edema. No acute osseus abnormalities.  IMPRESSION: Findings worrisome for developing left lower lung pneumonia. Further evaluation with a PA and lateral chest radiograph may be obtained as clinically indicated.   Electronically Signed   By: Simonne Come M.D.   On: 04/23/2015 13:51     CBC  Recent Labs Lab 04/23/15 1300 04/24/15 0525  WBC 17.9* 8.5  HGB 15.1* 11.8*  HCT 43.8 36.0  PLT 186 145*  MCV 86.7 86.1  MCH 29.9 28.2  MCHC 34.5 32.8  RDW 14.1 14.1  LYMPHSABS 0.7  --   MONOABS 1.4*  --   EOSABS 0.0  --   BASOSABS 0.0  --     Chemistries   Recent Labs Lab 04/23/15 1300 04/24/15 0525  NA 136 142  K 2.9* 3.3*  CL 103 113*  CO2 22 24  GLUCOSE 190* 123*  BUN 9 9  CREATININE 0.68 0.56  CALCIUM 8.9 8.1*  AST 39  --   ALT 48  --   ALKPHOS 58  --   BILITOT 0.9  --    ------------------------------------------------------------------------------------------------------------------ estimated creatinine clearance is 73.9 mL/min (by C-G formula based on Cr of 0.56). ------------------------------------------------------------------------------------------------------------------ No results for input(s): HGBA1C in the last 72 hours. ------------------------------------------------------------------------------------------------------------------ No results for input(s): CHOL, HDL, LDLCALC, TRIG, CHOLHDL, LDLDIRECT in the last 72 hours. ------------------------------------------------------------------------------------------------------------------ No results for input(s): TSH, T4TOTAL, T3FREE, THYROIDAB in the last 72 hours.  Invalid input(s): FREET3 ------------------------------------------------------------------------------------------------------------------ No results for input(s): VITAMINB12, FOLATE,  FERRITIN, TIBC, IRON, RETICCTPCT in the last 72 hours.  Coagulation profile No results for input(s): INR, PROTIME in the last 168 hours.  No results for input(s): DDIMER in the last 72 hours.  Cardiac Enzymes No results for input(s): CKMB, TROPONINI, MYOGLOBIN in the last 168 hours.  Invalid input(s): CK ------------------------------------------------------------------------------------------------------------------ Invalid input(s): POCBNP     Time Spent in minutes   25 minutes   [name] M.D on 04/24/2015 at 12:11 PM  Between 7am to  7pm - Pager - [phone]  After 7pm go to [email or web address] - password [email or web address]  [facility]   Office  [phone]

## 2015-04-25 LAB — BASIC METABOLIC PANEL
Anion gap: 3 — ABNORMAL LOW (ref 5–15)
BUN: 6 mg/dL (ref 6–20)
CHLORIDE: 114 mmol/L — AB (ref 101–111)
CO2: 25 mmol/L (ref 22–32)
Calcium: 7.9 mg/dL — ABNORMAL LOW (ref 8.9–10.3)
Creatinine, Ser: 0.52 mg/dL (ref 0.44–1.00)
GFR calc Af Amer: >60 mL/min (ref >60)
GFR calc non Af Amer: >60 mL/min (ref >60)
GLUCOSE: 84 mg/dL (ref 65–99)
POTASSIUM: 3.4 mmol/L — AB (ref 3.5–5.1)
Sodium: 142 mmol/L (ref 135–145)

## 2015-04-25 LAB — LEGIONELLA ANTIGEN, URINE

## 2015-04-25 MED ORDER — LEVOFLOXACIN IN D5W 750 MG/150ML IV SOLN
750.0000 mg | INTRAVENOUS | Status: DC
  Administered 2015-04-25 – 2015-04-26 (×2): 750 mg via INTRAVENOUS
  Filled 2015-04-25 (×2): qty 150

## 2015-04-25 MED ORDER — POTASSIUM CHLORIDE CRYS ER 20 MEQ PO TBCR
30.0000 meq | EXTENDED_RELEASE_TABLET | ORAL | Status: AC
  Administered 2015-04-25 (×2): 30 meq via ORAL
  Filled 2015-04-25 (×2): qty 1

## 2015-04-25 NOTE — Progress Notes (Signed)
Patient Demographics  [name], is a 29 y.o. female, DOB - 06-21-1986, VPX:[id number]  Admit date - 04/23/2015   Admitting Physician Starleen Arms, MD  Outpatient Primary MD for the patient is Pcp Not In System  LOS - 2   Chief Complaint  Patient presents with  . Flank Pain       Admission HPI/Brief narrative: 29 year old female with history of hypertension, IVDU, being treated for HCAP, continues to improve.  Subjective:   [name] today has, No headache, No chest pain, No abdominal pain - No Nausea, No new weakness tingling or numbness, still complaining of cough, shortness of breath improved.  Assessment & Plan    Active Problems:   IVDU (intravenous drug user)   Hepatitis C, chronic   Sepsis   HCAP (healthcare-associated pneumonia)   Hypokalemia   Sepsis secondary to HCAP - On IV vancomycin and aztreonam 04/23/15, given negative cultures so far, patient is clinically improving, will change antibiotics to IV levofloxacin -  negative influenza panel, negative strep pneumonia , Legionella antigen pending  -  blood cultures no growth to date    Hypokalemia - Repleted,  Hepatitis C chronic - LFTs within normal limits,    IVDU - UDS positive for cocaine, she was counseled.   Code Status: full  Family Communication: none at bedside  Disposition Plan: home in 24-48 hrs   Procedures  none   Consults   none   Medications  Scheduled Meds: . citalopram  40 mg Oral Daily  . gabapentin  600 mg Oral QID  . heparin  5,000 Units Subcutaneous 3 times per day  . levofloxacin (LEVAQUIN) IV  750 mg Intravenous Q24H  . potassium chloride  30 mEq Oral Q4H  . QUEtiapine  300 mg Oral QHS  . sodium chloride  3 mL Intravenous Q12H   Continuous Infusions: . sodium chloride 100 mL/hr at 04/24/15 1808   PRN Meds:.acetaminophen **OR** acetaminophen, bisacodyl,  cyclobenzaprine, diphenhydrAMINE, ondansetron **OR** ondansetron (ZOFRAN) IV, oxyCODONE, polyethylene glycol, senna, zolpidem  DVT Prophylaxis Heparin -   Lab Results  Component Value Date   PLT 145* 04/24/2015    Antibiotics    Anti-infectives    Start     Dose/Rate Route Frequency Ordered Stop   04/25/15 1400  levofloxacin (LEVAQUIN) IVPB 750 mg     750 mg 100 mL/hr over 90 Minutes Intravenous Every 24 hours 04/25/15 1223     04/24/15 1200  levofloxacin (LEVAQUIN) IVPB 750 mg  Status:  Discontinued     750 mg 100 mL/hr over 90 Minutes Intravenous Every 24 hours 04/23/15 1300 04/23/15 1753   04/23/15 2359  aztreonam (AZACTAM) 1 g in dextrose 5 % 50 mL IVPB  Status:  Discontinued     1 g 100 mL/hr over 30 Minutes Intravenous Every 8 hours 04/23/15 1300 04/25/15 1223   04/23/15 2200  vancomycin (VANCOCIN) IVPB 750 mg/150 ml premix  Status:  Discontinued     750 mg 150 mL/hr over 60 Minutes Intravenous Every 8 hours 04/23/15 1300 04/25/15 1223   04/23/15 1245  levofloxacin (LEVAQUIN) IVPB 750 mg     750 mg 100 mL/hr over 90 Minutes Intravenous  Once 04/23/15 1239 04/23/15 1620   04/23/15 1245  aztreonam (  AZACTAM) 2 g in dextrose 5 % 50 mL IVPB     2 g 100 mL/hr over 30 Minutes Intravenous  Once 04/23/15 1239 04/23/15 1315   04/23/15 1245  vancomycin (VANCOCIN) IVPB 1000 mg/200 mL premix     1,000 mg 200 mL/hr over 60 Minutes Intravenous  Once 04/23/15 1239 04/23/15 1325          Objective:   Filed Vitals:   04/24/15 0559 04/24/15 1403 04/24/15 2105 04/25/15 0428  BP: 99/55 113/64 115/74 105/65  Pulse: 105 98 104 81  Temp: 98 F (36.7 C)  98.1 F (36.7 C) 97.9 F (36.6 C)  TempSrc: Oral  Oral Oral  Resp: 20 21 22 20   Height:      Weight:      SpO2: 99% 98% 98% 99%    Wt Readings from Last 3 Encounters:  04/23/15 52.164 kg (115 lb)  01/06/15 55.1 kg (121 lb 7.6 oz)  10/21/13 49.896 kg (110 lb)     Intake/Output Summary (Last 24 hours) at 04/25/15 1242 Last  data filed at 04/25/15 0830  Gross per 24 hour  Intake 2580.83 ml  Output    100 ml  Net 2480.83 ml     Physical Exam  Awake Alert, Oriented X 3, No new F.N deficits, Normal affect [place].AT,PERRAL Supple Neck,No JVD, No cervical lymphadenopathy appriciated.  Symmetrical Chest wall movement, Good air movement bilaterally, bibasilar Rales RRR,No Gallops,Rubs or new Murmurs, No Parasternal Heave +ve B.Sounds, Abd Soft, No tenderness, No organomegaly appriciated, No rebound - guarding or rigidity. No Cyanosis, Clubbing or edema, No new Rash or bruise     Data Review   Micro Results Recent Results (from the past 240 hour(s))  Urine culture     Status: None   Collection Time: 04/23/15 12:38 PM  Result Value Ref Range Status   Specimen Description URINE, CLEAN CATCH  Final   Special Requests NONE  Final   Colony Count   Final    9,000 COLONIES/ML Performed at [facility]    Culture   Final    INSIGNIFICANT GROWTH Performed at [facility]    Report Status 04/24/2015 FINAL  Final  Blood Culture (routine x 2)     Status: None (Preliminary result)   Collection Time: 04/23/15  1:00 PM  Result Value Ref Range Status   Specimen Description BLOOD RIGHT HAND  Final   Special Requests BOTTLES DRAWN AEROBIC AND ANAEROBIC 5 CC EA  Final   Culture   Final           BLOOD CULTURE RECEIVED NO GROWTH TO DATE CULTURE WILL BE HELD FOR 5 DAYS BEFORE ISSUING A FINAL NEGATIVE REPORT Performed at [facility]    Report Status PENDING  Incomplete  Blood Culture (routine x 2)     Status: None (Preliminary result)   Collection Time: 04/23/15  1:03 PM  Result Value Ref Range Status   Specimen Description BLOOD BLOOD LEFT FOREARM  Final   Special Requests BOTTLES DRAWN AEROBIC AND ANAEROBIC 5 CC EA  Final   Culture   Final           BLOOD CULTURE RECEIVED NO GROWTH TO DATE CULTURE WILL BE HELD FOR 5 DAYS BEFORE ISSUING A FINAL NEGATIVE REPORT Performed at [facility]    Report Status PENDING  Incomplete    Radiology Reports Ct Abdomen Pelvis W Contrast  04/23/2015   CLINICAL DATA:  29 year old female with flank pain during  urination yesterday. Febrile since yesterday with fever 101.3.  EXAM: CT ABDOMEN AND PELVIS WITH CONTRAST  TECHNIQUE: Multidetector CT imaging of the abdomen and pelvis was performed using the standard protocol following bolus administration of intravenous contrast.  CONTRAST:  80mL OMNIPAQUE IOHEXOL 300 MG/ML  SOLN  COMPARISON:  CT of the abdomen and pelvis 10/16/2014  FINDINGS: Lower chest: Extensive airspace consolidation in the posterior aspect of the left lower lobe. Small focus of nodular airspace consolidation in the right lower lobe as well, presumably from endobronchial spread of infection.  Hepatobiliary: No cystic or solid hepatic lesions. No intra or extrahepatic biliary ductal dilatation. Mild diffuse periportal edema. Gallbladder is nearly completely decompressed, but otherwise unremarkable in appearance.  Pancreas: No pancreatic mass. No pancreatic ductal dilatation. No pancreatic or peripancreatic fluid or inflammatory changes.  Spleen: Unremarkable.  Adrenals/Urinary Tract: Sub cm low-attenuation lesions in the kidneys bilaterally are too small to definitively characterize, but are statistically favored to represent tiny cysts. Enhancement pattern of the kidneys is normal bilaterally. No hydroureteronephrosis or perinephric stranding. Urinary bladder is normal in appearance. Bilateral adrenal glands are normal in appearance.  Stomach/Bowel: The appearance of the stomach is normal. No pathologic dilatation of small bowel or colon. Normal appendix.  Vascular/Lymphatic: No atherosclerotic disease, aneurysm or dissection identified in the abdominal or pelvic vasculature. No lymphadenopathy noted in the abdomen or pelvis.  Reproductive: Uterus is retroverted. Ovaries are unremarkable in appearance.  Other: No significant volume of  ascites.  No pneumoperitoneum.  Musculoskeletal: There are no aggressive appearing lytic or blastic lesions noted in the visualized portions of the skeleton.  IMPRESSION: 1. Left lower lobe pneumonia with early endobronchial spread of infection into the right lower lobe. 2. No acute findings in the abdomen or pelvis to account for the patient's symptoms. 3. Normal appendix.   Electronically Signed   By: Trudie Reed M.D.   On: 04/23/2015 16:24   Dg Chest Port 1 View  04/23/2015   CLINICAL DATA:  Fever.  Current smoker.  EXAM: PORTABLE CHEST - 1 VIEW  COMPARISON:  01/06/2015; 07/23/2013  FINDINGS: Grossly unchanged cardiac silhouette and mediastinal contours. Interval development of ill-defined retrocardiac heterogeneous airspace opacities. No pleural effusion pneumothorax. No evidence of edema. No acute osseus abnormalities.  IMPRESSION: Findings worrisome for developing left lower lung pneumonia. Further evaluation with a PA and lateral chest radiograph may be obtained as clinically indicated.   Electronically Signed   By: Simonne Come M.D.   On: 04/23/2015 13:51     CBC  Recent Labs Lab 04/23/15 1300 04/24/15 0525  WBC 17.9* 8.5  HGB 15.1* 11.8*  HCT 43.8 36.0  PLT 186 145*  MCV 86.7 86.1  MCH 29.9 28.2  MCHC 34.5 32.8  RDW 14.1 14.1  LYMPHSABS 0.7  --   MONOABS 1.4*  --   EOSABS 0.0  --   BASOSABS 0.0  --     Chemistries   Recent Labs Lab 04/23/15 1300 04/24/15 0525 04/25/15 0510  NA 136 142 142  K 2.9* 3.3* 3.4*  CL 103 113* 114*  CO2 GLUCOSE 190* 123* 84  BUN CREATININE 0.68 0.56 0.52  CALCIUM 8.9 8.1* 7.9*  AST 39  --   --   ALT 48  --   --   ALKPHOS 58  --   --   BILITOT 0.9  --   --    ------------------------------------------------------------------------------------------------------------------ estimated creatinine clearance is 73.9 mL/min (by C-G formula based  on Cr of  0.52). ------------------------------------------------------------------------------------------------------------------ No results for input(s): HGBA1C in the last 72 hours. ------------------------------------------------------------------------------------------------------------------ No results for input(s): CHOL, HDL, LDLCALC, TRIG, CHOLHDL, LDLDIRECT in the last 72 hours. ------------------------------------------------------------------------------------------------------------------ No results for input(s): TSH, T4TOTAL, T3FREE, THYROIDAB in the last 72 hours.  Invalid input(s): FREET3 ------------------------------------------------------------------------------------------------------------------ No results for input(s): VITAMINB12, FOLATE, FERRITIN, TIBC, IRON, RETICCTPCT in the last 72 hours.  Coagulation profile No results for input(s): INR, PROTIME in the last 168 hours.  No results for input(s): DDIMER in the last 72 hours.  Cardiac Enzymes No results for input(s): CKMB, TROPONINI, MYOGLOBIN in the last 168 hours.  Invalid input(s): CK ------------------------------------------------------------------------------------------------------------------ Invalid input(s): POCBNP     Time Spent in minutes   25 minutes   ELGERGAWY, DAWOOD M.D on 04/25/2015 at 12:42 PM  Between 7am to 7pm - Pager - [phone]  After 7pm go to [email or web address] - password [facility]  [facility]   Office  [phone]

## 2015-04-26 MED ORDER — LEVOFLOXACIN 500 MG PO TABS: 500.0000 mg | ORAL_TABLET | Freq: Every day | ORAL | Status: AC

## 2015-04-26 MED ORDER — OXYCODONE HCL 15 MG PO TABS: 15.0000 mg | ORAL_TABLET | Freq: Four times a day (QID) | ORAL | Status: DC | PRN

## 2015-04-26 NOTE — Discharge Summary (Signed)
[name], is a 29 y.o. female  DOB [birth year]  MRN [id number].  Admission date:  04/23/2015  Admitting Physician  Starleen Arms, MD  Discharge Date:  04/26/2015   Primary MD  Pcp Not In System  Recommendations for primary care physician for things to follow:  - Check CBC, BMP, recheck chest x-ray in one week    Admission Diagnosis  HCAP (healthcare-associated pneumonia) [J18.9]   Discharge Diagnosis  HCAP (healthcare-associated pneumonia) [J18.9]    Active Problems:   IVDU (intravenous drug user)   Hepatitis C, chronic   Sepsis   HCAP (healthcare-associated pneumonia)   Hypokalemia      Past Medical History  Diagnosis Date  . Hypertension   . Heart murmur   . Seizures   . Cocaine abuse 01/06/2015  . Hepatitis C, chronic     Past Surgical History  Procedure Laterality Date  . No past surgeries    . I&d extremity Right 01/07/2015    Procedure: IRRIGATION AND DEBRIDEMENT EXTREMITY;  Surgeon: Dominica Severin, MD;  Location: [facility] OR;  Service: Orthopedics;  Laterality: Right;       History of present illness and  Hospital Course:     Kindly see H&P for history of present illness and admission details, please review complete Labs, Consult reports and Test reports for all details in brief  HPI  from the history and physical done on the day of admission   [name] is a 29 y.o. female, with past medical history of hypertension, IVDU, recently discharged from [facility] on 01/13/15 for supportive thrombophlebitis on IV antibiotics on facility, patient reports was discharged from nursing home in April, presents with complaints of fever, chills, cough, productive sputum, flank pain, NAD patient was found to be febrile 101.3, leukocytosis 17.9, CT abdomen with no acute findings and abdomen, but significant for left base infiltrate, and developing right base pneumonia, urinalysis was  contaminated, patient denies any IV drug abuse over last 4 month, started on IV vancomycin, Azactam, levofloxacin in ED, after blood cultures were sent, denies any chest pain, shortness of breath, dysuria or polyuria, hospitalist requested to admit the patient.  Hospital Course   Sepsis secondary to HCAP -Treated initially with IV vancomycin and aztreonam 04/23/15, given negative and clinically improving, changed antibiotics to IV levofloxacin 6/10, to finish another 4 days of oral levofloxacin as an outpatient. - negative influenza panel, negative strep pneumonia , negative Legionella antigen .  - blood cultures no growth to date    Hypokalemia - Repleted,  Hepatitis C chronic - LFTs within normal limits,   IVDU - UDS positive for cocaine, she was counseled.    Discharge Condition:  Stable  Follow UP      Discharge Instructions  and  Discharge Medications    Discharge Instructions    Discharge instructions    Complete by:  As directed   Follow with Primary MD Pcp Not In System in 7 days   Get CBC, CMP, 2 view Chest X ray checked  by Primary MD next visit.    Activity: As tolerated with Full fall precautions use walker/cane & assistance as needed   Disposition Home    Diet: Regular , with feeding assistance and aspiration precautions.  For Heart failure patients - Check your Weight same time everyday, if you gain over 2 pounds, or you develop in leg swelling, experience more shortness of breath or chest pain, call your Primary MD immediately. Follow Cardiac Low Salt Diet and 1.5 lit/day fluid restriction.   On your next visit with your primary care physician please Get Medicines reviewed and adjusted.   Please request your Prim.MD to go over all Hospital Tests and Procedure/Radiological results at the follow up, please get all Hospital records sent to your Prim MD by signing hospital release before you go home.   If you experience worsening of your admission  symptoms, develop shortness of breath, life threatening emergency, suicidal or homicidal thoughts you must seek medical attention immediately by calling 911 or calling your MD immediately  if symptoms less severe.  You Must read complete instructions/literature along with all the possible adverse reactions/side effects for all the Medicines you take and that have been prescribed to you. Take any new Medicines after you have completely understood and accpet all the possible adverse reactions/side effects.   Do not drive, operating heavy machinery, perform activities at heights, swimming or participation in water activities or provide baby sitting services if your were admitted for syncope or siezures until you have seen by Primary MD or a Neurologist and advised to do so again.  Do not drive when taking Pain medications.    Do not take more than prescribed Pain, Sleep and Anxiety Medications  Special Instructions: If you have smoked or chewed Tobacco  in the last 2 yrs please stop smoking, stop any regular Alcohol  and or any Recreational drug use.  Wear Seat belts while driving.   Please note  You were cared for by a hospitalist during your hospital stay. If you have any questions about your discharge medications or the care you received while you were in the hospital after you are discharged, you can call the unit and asked to speak with the hospitalist on call if the hospitalist that took care of you is not available. Once you are discharged, your primary care physician will handle any further medical issues. Please note that NO REFILLS for any discharge medications will be authorized once you are discharged, as it is imperative that you return to your primary care physician (or establish a relationship with a primary care physician if you do not have one) for your aftercare needs so that they can reassess your need for medications and monitor your lab values.            Medication List      STOP taking these medications        diphenhydrAMINE 25 mg capsule  Commonly known as:  BENADRYL     pregabalin 50 MG capsule  Commonly known as:  LYRICA      TAKE these medications        citalopram 40 MG tablet  Commonly known as:  CELEXA  Take 1 tablet (40 mg total) by mouth daily.     cyclobenzaprine 5 MG tablet  Commonly known as:  FLEXERIL  Take 1 tablet (5 mg total) by mouth 3 (three) times daily as needed for muscle spasms.     gabapentin 300 MG capsule  Commonly known as:  NEURONTIN  Take 600 mg by mouth 4 (four) times daily.     levofloxacin 500 MG tablet  Commonly known as:  LEVAQUIN  Take 1 tablet (500 mg total) by mouth daily.  Start taking on:  04/27/2015     oxyCODONE 15 MG immediate release tablet  Commonly known as:  ROXICODONE  Take 1 tablet (15 mg total) by mouth every 6 (six) hours as needed for moderate pain, severe pain or breakthrough pain.     QUEtiapine 300 MG 24 hr tablet  Commonly known as:  SEROQUEL XR  Take 300 mg by mouth at bedtime.     senna 8.6 MG Tabs tablet  Commonly known as:  SENOKOT  Take 1 tablet (8.6 mg total) by mouth 2 (two) times daily as needed for mild constipation.     zolpidem 5 MG tablet  Commonly known as:  AMBIEN  Take 1 tablet (5 mg total) by mouth at bedtime as needed for sleep.          Diet and Activity recommendation: See Discharge Instructions above   Consults obtained - none   Major procedures and Radiology Reports - PLEASE review detailed and final reports for all details, in brief -      Ct Abdomen Pelvis W Contrast  04/23/2015   CLINICAL DATA:  29 year old female with flank pain during urination yesterday. Febrile since yesterday with fever 101.3.  EXAM: CT ABDOMEN AND PELVIS WITH CONTRAST  TECHNIQUE: Multidetector CT imaging of the abdomen and pelvis was performed using the standard protocol following bolus administration of intravenous contrast.  CONTRAST:  80mL OMNIPAQUE IOHEXOL 300 MG/ML   SOLN  COMPARISON:  CT of the abdomen and pelvis 10/16/2014  FINDINGS: Lower chest: Extensive airspace consolidation in the posterior aspect of the left lower lobe. Small focus of nodular airspace consolidation in the right lower lobe as well, presumably from endobronchial spread of infection.  Hepatobiliary: No cystic or solid hepatic lesions. No intra or extrahepatic biliary ductal dilatation. Mild diffuse periportal edema. Gallbladder is nearly completely decompressed, but otherwise unremarkable in appearance.  Pancreas: No pancreatic mass. No pancreatic ductal dilatation. No pancreatic or peripancreatic fluid or inflammatory changes.  Spleen: Unremarkable.  Adrenals/Urinary Tract: Sub cm low-attenuation lesions in the kidneys bilaterally are too small to definitively characterize, but are statistically favored to represent tiny cysts. Enhancement pattern of the kidneys is normal bilaterally. No hydroureteronephrosis or perinephric stranding. Urinary bladder is normal in appearance. Bilateral adrenal glands are normal in appearance.  Stomach/Bowel: The appearance of the stomach is normal. No pathologic dilatation of small bowel or colon. Normal appendix.  Vascular/Lymphatic: No atherosclerotic disease, aneurysm or dissection identified in the abdominal or pelvic vasculature. No lymphadenopathy noted in the abdomen or pelvis.  Reproductive: Uterus is retroverted. Ovaries are unremarkable in appearance.  Other: No significant volume of ascites.  No pneumoperitoneum.  Musculoskeletal: There are no aggressive appearing lytic or blastic lesions noted in the visualized portions of the skeleton.  IMPRESSION: 1. Left lower lobe pneumonia with early endobronchial spread of infection into the right lower lobe. 2. No acute findings in the abdomen or pelvis to account for the patient's symptoms. 3. Normal appendix.   Electronically Signed   By: Trudie Reed M.D.   On: 04/23/2015 16:24   Dg Chest Port 1 View  04/23/2015    CLINICAL DATA:  Fever.  Current smoker.  EXAM: PORTABLE CHEST - 1 VIEW  COMPARISON:  01/06/2015; 07/23/2013  FINDINGS: Grossly unchanged cardiac silhouette and mediastinal contours.  Interval development of ill-defined retrocardiac heterogeneous airspace opacities. No pleural effusion pneumothorax. No evidence of edema. No acute osseus abnormalities.  IMPRESSION: Findings worrisome for developing left lower lung pneumonia. Further evaluation with a PA and lateral chest radiograph may be obtained as clinically indicated.   Electronically Signed   By: Simonne Come M.D.   On: 04/23/2015 13:51    Micro Results   Results for orders placed or performed during the hospital encounter of 04/23/15  Urine culture     Status: None   Collection Time: 04/23/15 12:38 PM  Result Value Ref Range Status   Specimen Description URINE, CLEAN CATCH  Final   Special Requests NONE  Final   Colony Count   Final    9,000 COLONIES/ML Performed at [facility]    Culture   Final    INSIGNIFICANT GROWTH Performed at [facility]    Report Status 04/24/2015 FINAL  Final  Blood Culture (routine x 2)     Status: None (Preliminary result)   Collection Time: 04/23/15  1:00 PM  Result Value Ref Range Status   Specimen Description BLOOD RIGHT HAND  Final   Special Requests BOTTLES DRAWN AEROBIC AND ANAEROBIC 5 CC EA  Final   Culture   Final           BLOOD CULTURE RECEIVED NO GROWTH TO DATE CULTURE WILL BE HELD FOR 5 DAYS BEFORE ISSUING A FINAL NEGATIVE REPORT Performed at [facility]    Report Status PENDING  Incomplete  Blood Culture (routine x 2)     Status: None (Preliminary result)   Collection Time: 04/23/15  1:03 PM  Result Value Ref Range Status   Specimen Description BLOOD BLOOD LEFT FOREARM  Final   Special Requests BOTTLES DRAWN AEROBIC AND ANAEROBIC 5 CC EA  Final   Culture   Final           BLOOD CULTURE RECEIVED NO GROWTH TO DATE CULTURE WILL BE HELD FOR 5 DAYS BEFORE ISSUING A  FINAL NEGATIVE REPORT Performed at [facility]    Report Status PENDING  Incomplete     Recent Results (from the past 240 hour(s))  Urine culture     Status: None   Collection Time: 04/23/15 12:38 PM  Result Value Ref Range Status   Specimen Description URINE, CLEAN CATCH  Final   Special Requests NONE  Final   Colony Count   Final    9,000 COLONIES/ML Performed at [facility]   Final    INSIGNIFICANT GROWTH Performed at [facility]    Report Status 04/24/2015 FINAL  Final  Blood Culture (routine x 2)     Status: None (Preliminary result)   Collection Time: 04/23/15  1:00 PM  Result Value Ref Range Status   Specimen Description BLOOD RIGHT HAND  Final   Special Requests BOTTLES DRAWN AEROBIC AND ANAEROBIC 5 CC EA  Final   Culture   Final           BLOOD CULTURE RECEIVED NO GROWTH TO DATE CULTURE WILL BE HELD FOR 5 DAYS BEFORE ISSUING A FINAL NEGATIVE REPORT Performed at [facility]    Report Status PENDING  Incomplete  Blood Culture (routine x 2)     Status: None (Preliminary result)   Collection Time: 04/23/15  1:03 PM  Result Value Ref Range Status   Specimen Description BLOOD BLOOD LEFT FOREARM  Final   Special Requests BOTTLES DRAWN AEROBIC  AND ANAEROBIC 5 CC EA  Final   Culture   Final           BLOOD CULTURE RECEIVED NO GROWTH TO DATE CULTURE WILL BE HELD FOR 5 DAYS BEFORE ISSUING A FINAL NEGATIVE REPORT Performed at [facility]    Report Status PENDING  Incomplete       Today   Subjective:   [name] today has no headache,no chest abdominal pain,no new weakness tingling or numbness, feels much better today. Cough significantly improved.  Objective:   Blood pressure 126/81, pulse 98, temperature 97.4 F (36.3 C), temperature source Oral, resp. rate 20, height 5' (1.524 m), weight 52.164 kg (115 lb), last menstrual period 04/19/2015, SpO2 100 %.   Intake/Output Summary (Last 24 hours) at  04/26/15 1043 Last data filed at 04/26/15 0820  Gross per 24 hour  Intake 2930.51 ml  Output      0 ml  Net 2930.51 ml    Exam Awake Alert, Oriented X 3, No new F.N deficits, Normal affect [place].AT,PERRAL Supple Neck,No JVD, No cervical lymphadenopathy appriciated.  Symmetrical Chest wall movement, Good air movement bilaterally, bibasilar Rales significantly subsided RRR,No Gallops,Rubs or new Murmurs, No Parasternal Heave +ve B.Sounds, Abd Soft, No tenderness, No organomegaly appriciated, No rebound - guarding or rigidity. No Cyanosis, Clubbing or edema, No new Rash or bruise  Data Review   CBC w Diff: Lab Results  Component Value Date   WBC 8.5 04/24/2015   HGB 11.8* 04/24/2015   HCT 36.0 04/24/2015   PLT 145* 04/24/2015   LYMPHOPCT 4* 04/23/2015   MONOPCT 8 04/23/2015   EOSPCT 0 04/23/2015   BASOPCT 0 04/23/2015    CMP: Lab Results  Component Value Date   NA 142 04/25/2015   K 3.4* 04/25/2015   CL 114* 04/25/2015   CO2 25 04/25/2015   BUN 6 04/25/2015   CREATININE 0.52 04/25/2015   PROT 7.3 04/23/2015   ALBUMIN 3.5 04/23/2015   BILITOT 0.9 04/23/2015   ALKPHOS 58 04/23/2015   AST 39 04/23/2015   ALT 48 04/23/2015  .   Total Time in preparing paper work, data evaluation and todays exam - 35 minutes  [name] M.D on 04/26/2015 at 10:43 AM  [facility]   Office  [phone]

## 2015-04-26 NOTE — Discharge Instructions (Signed)
Follow with Primary MD Pcp Not In System in 7 days   Get CBC, CMP, 2 view Chest X ray checked  by Primary MD next visit.    Activity: As tolerated with Full fall precautions use walker/cane & assistance as needed   Disposition Home    Diet: Regular , with feeding assistance and aspiration precautions.  For Heart failure patients - Check your Weight same time everyday, if you gain over 2 pounds, or you develop in leg swelling, experience more shortness of breath or chest pain, call your Primary MD immediately. Follow Cardiac Low Salt Diet and 1.5 lit/day fluid restriction.   On your next visit with your primary care physician please Get Medicines reviewed and adjusted.   Please request your Prim.MD to go over all Hospital Tests and Procedure/Radiological results at the follow up, please get all Hospital records sent to your Prim MD by signing hospital release before you go home.   If you experience worsening of your admission symptoms, develop shortness of breath, life threatening emergency, suicidal or homicidal thoughts you must seek medical attention immediately by calling 911 or calling your MD immediately  if symptoms less severe.  You Must read complete instructions/literature along with all the possible adverse reactions/side effects for all the Medicines you take and that have been prescribed to you. Take any new Medicines after you have completely understood and accpet all the possible adverse reactions/side effects.   Do not drive, operating heavy machinery, perform activities at heights, swimming or participation in water activities or provide baby sitting services if your were admitted for syncope or siezures until you have seen by Primary MD or a Neurologist and advised to do so again.  Do not drive when taking Pain medications.    Do not take more than prescribed Pain, Sleep and Anxiety Medications  Special Instructions: If you have smoked or chewed Tobacco  in the last  2 yrs please stop smoking, stop any regular Alcohol  and or any Recreational drug use.  Wear Seat belts while driving.   Please note  You were cared for by a hospitalist during your hospital stay. If you have any questions about your discharge medications or the care you received while you were in the hospital after you are discharged, you can call the unit and asked to speak with the hospitalist on call if the hospitalist that took care of you is not available. Once you are discharged, your primary care physician will handle any further medical issues. Please note that NO REFILLS for any discharge medications will be authorized once you are discharged, as it is imperative that you return to your primary care physician (or establish a relationship with a primary care physician if you do not have one) for your aftercare needs so that they can reassess your need for medications and monitor your lab values.

## 2015-04-26 NOTE — Progress Notes (Signed)
Pt given d/c instructions, when to call MD, encouraged to call and make appointment/set up PCP, prescriptions given, meds reviewed--pt verbalized understanding and asked appropriate questions.  Pt came without clothes, paper scrubs provided.  Ride unable to come pick up.  Pt without shoes, SW provided cab voucher to [employer].  Pt to finish IV abx then d/c.

## 2015-04-26 NOTE — Clinical Social Work Note (Signed)
CSW received a call from RN stating that pt needed a taxi voucher for discharge home.   Pt had no clothes or shoes to travel with and no one to pick her up  CSW met with pt and confirmed address. CSW provided taxi voucher to RN  .Dede Query, LCSW [facility] Clinical Social Worker - Weekend Coverage cell #: [phone]

## 2015-04-29 LAB — CULTURE, BLOOD (ROUTINE X 2)
CULTURE: NO GROWTH
Culture: NO GROWTH

## 2015-05-18 ENCOUNTER — Encounter (HOSPITAL_COMMUNITY): Payer: Self-pay | Admitting: Emergency Medicine

## 2015-05-18 ENCOUNTER — Emergency Department (HOSPITAL_COMMUNITY)
Admission: EM | Admit: 2015-05-18 | Discharge: 2015-05-19 | Disposition: A | Discharge status: Home / Self Care | Payer: Self-pay | Attending: Emergency Medicine | Admitting: Emergency Medicine

## 2015-05-18 DIAGNOSIS — Z79899 Other long term (current) drug therapy: Secondary | ICD-10-CM | POA: Insufficient documentation

## 2015-05-18 DIAGNOSIS — F1510 Other stimulant abuse, uncomplicated: Secondary | ICD-10-CM | POA: Insufficient documentation

## 2015-05-18 DIAGNOSIS — F1210 Cannabis abuse, uncomplicated: Secondary | ICD-10-CM | POA: Insufficient documentation

## 2015-05-18 DIAGNOSIS — Z3202 Encounter for pregnancy test, result negative: Secondary | ICD-10-CM | POA: Insufficient documentation

## 2015-05-18 DIAGNOSIS — R630 Anorexia: Secondary | ICD-10-CM | POA: Insufficient documentation

## 2015-05-18 DIAGNOSIS — Z880 Allergy status to penicillin: Secondary | ICD-10-CM | POA: Insufficient documentation

## 2015-05-18 DIAGNOSIS — F1410 Cocaine abuse, uncomplicated: Secondary | ICD-10-CM | POA: Insufficient documentation

## 2015-05-18 DIAGNOSIS — N760 Acute vaginitis: Secondary | ICD-10-CM | POA: Insufficient documentation

## 2015-05-18 DIAGNOSIS — Z8619 Personal history of other infectious and parasitic diseases: Secondary | ICD-10-CM | POA: Insufficient documentation

## 2015-05-18 DIAGNOSIS — B9689 Other specified bacterial agents as the cause of diseases classified elsewhere: Secondary | ICD-10-CM

## 2015-05-18 DIAGNOSIS — Z720 Tobacco use: Secondary | ICD-10-CM | POA: Insufficient documentation

## 2015-05-18 DIAGNOSIS — F1910 Other psychoactive substance abuse, uncomplicated: Secondary | ICD-10-CM

## 2015-05-18 DIAGNOSIS — I10 Essential (primary) hypertension: Secondary | ICD-10-CM | POA: Insufficient documentation

## 2015-05-18 DIAGNOSIS — F1110 Opioid abuse, uncomplicated: Secondary | ICD-10-CM | POA: Insufficient documentation

## 2015-05-18 DIAGNOSIS — R011 Cardiac murmur, unspecified: Secondary | ICD-10-CM | POA: Insufficient documentation

## 2015-05-18 DIAGNOSIS — G40909 Epilepsy, unspecified, not intractable, without status epilepticus: Secondary | ICD-10-CM | POA: Insufficient documentation

## 2015-05-18 LAB — URINALYSIS, ROUTINE W REFLEX MICROSCOPIC
GLUCOSE, UA: NEGATIVE mg/dL
Hgb urine dipstick: NEGATIVE
Ketones, ur: 15 mg/dL — AB
Nitrite: NEGATIVE
Protein, ur: 30 mg/dL — AB
SPECIFIC GRAVITY, URINE: 1.03 (ref 1.005–1.030)
Urobilinogen, UA: 1 mg/dL (ref 0.0–1.0)
pH: 5.5 (ref 5.0–8.0)

## 2015-05-18 LAB — RAPID URINE DRUG SCREEN, HOSP PERFORMED
Amphetamines: NOT DETECTED
BARBITURATES: NOT DETECTED
Benzodiazepines: NOT DETECTED
Cocaine: POSITIVE — AB
OPIATES: POSITIVE — AB
Tetrahydrocannabinol: POSITIVE — AB

## 2015-05-18 LAB — URINE MICROSCOPIC-ADD ON

## 2015-05-18 LAB — POC URINE PREG, ED: Preg Test, Ur: NEGATIVE

## 2015-05-18 MED ORDER — SODIUM CHLORIDE 0.9 % IV BOLUS (SEPSIS)
1000.0000 mL | INTRAVENOUS | Status: AC
  Administered 2015-05-18: 1000 mL via INTRAVENOUS

## 2015-05-18 MED ORDER — DIPHENHYDRAMINE HCL 25 MG PO CAPS
50.0000 mg | ORAL_CAPSULE | Freq: Once | ORAL | Status: AC
  Administered 2015-05-18: 50 mg via ORAL
  Filled 2015-05-18: qty 2

## 2015-05-18 MED ORDER — LORAZEPAM 2 MG/ML IJ SOLN
1.0000 mg | Freq: Once | INTRAMUSCULAR | Status: AC
  Administered 2015-05-18: 1 mg via INTRAVENOUS
  Filled 2015-05-18: qty 1
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
## 2015-05-19 LAB — CBC WITH DIFFERENTIAL/PLATELET
BASOS ABS: 0.1 10*3/uL (ref 0.0–0.1)
BASOS PCT: 1 % (ref 0–1)
EOS ABS: 0.1 10*3/uL (ref 0.0–0.7)
Eosinophils Relative: 1 % (ref 0–5)
HCT: 40.9 % (ref 36.0–46.0)
Hemoglobin: 14 g/dL (ref 12.0–15.0)
LYMPHS PCT: 19 % (ref 12–46)
Lymphs Abs: 1.8 10*3/uL (ref 0.7–4.0)
MCH: 29.5 pg (ref 26.0–34.0)
MCHC: 34.2 g/dL (ref 30.0–36.0)
MCV: 86.1 fL (ref 78.0–100.0)
Monocytes Absolute: 0.7 10*3/uL (ref 0.1–1.0)
Monocytes Relative: 7 % (ref 3–12)
NEUTROS PCT: 72 % (ref 43–77)
Neutro Abs: 6.8 10*3/uL (ref 1.7–7.7)
Platelets: 253 10*3/uL (ref 150–400)
RBC: 4.75 MIL/uL (ref 3.87–5.11)
RDW: 13.9 % (ref 11.5–15.5)
WBC: 9.4 10*3/uL (ref 4.0–10.5)

## 2015-05-19 LAB — COMPREHENSIVE METABOLIC PANEL
ALBUMIN: 4 g/dL (ref 3.5–5.0)
ALT: 81 U/L — ABNORMAL HIGH (ref 14–54)
ANION GAP: 10 (ref 5–15)
AST: 82 U/L — ABNORMAL HIGH (ref 15–41)
Alkaline Phosphatase: 64 U/L (ref 38–126)
BUN: 12 mg/dL (ref 6–20)
CHLORIDE: 105 mmol/L (ref 101–111)
CO2: 22 mmol/L (ref 22–32)
CREATININE: 0.76 mg/dL (ref 0.44–1.00)
Calcium: 8.6 mg/dL — ABNORMAL LOW (ref 8.9–10.3)
GFR calc Af Amer: >60 mL/min (ref >60)
GLUCOSE: 104 mg/dL — AB (ref 65–99)
POTASSIUM: 3.5 mmol/L (ref 3.5–5.1)
Sodium: 137 mmol/L (ref 135–145)
Total Bilirubin: 0.6 mg/dL (ref 0.3–1.2)
Total Protein: 6.8 g/dL (ref 6.5–8.1)

## 2015-05-19 LAB — WET PREP, GENITAL
Trich, Wet Prep: NONE SEEN
Yeast Wet Prep HPF POC: NONE SEEN

## 2015-05-19 LAB — HIV ANTIBODY (ROUTINE TESTING W REFLEX): HIV Screen 4th Generation wRfx: NONREACTIVE

## 2015-05-19 MED ORDER — METRONIDAZOLE 500 MG PO TABS: 500.0000 mg | ORAL_TABLET | Freq: Two times a day (BID) | ORAL | Status: AC

## 2015-05-19 MED ORDER — DOXYCYCLINE HYCLATE 100 MG PO CAPS: 100.0000 mg | ORAL_CAPSULE | Freq: Two times a day (BID) | ORAL | Status: DC

## 2015-05-19 MED ORDER — METRONIDAZOLE 500 MG PO TABS
2000.0000 mg | ORAL_TABLET | Freq: Once | ORAL | Status: AC
  Administered 2015-05-19: 2000 mg via ORAL
  Filled 2015-05-19: qty 4

## 2015-05-19 MED ORDER — STERILE WATER FOR INJECTION IJ SOLN
INTRAMUSCULAR | Status: AC
  Administered 2015-05-19: 10 mL
  Filled 2015-05-19: qty 10

## 2015-05-19 MED ORDER — AZITHROMYCIN 250 MG PO TABS
1000.0000 mg | ORAL_TABLET | Freq: Once | ORAL | Status: AC
  Administered 2015-05-19: 1000 mg via ORAL
  Filled 2015-05-19: qty 4

## 2015-05-19 MED ORDER — ALPRAZOLAM 0.25 MG PO TABS
0.2500 mg | ORAL_TABLET | Freq: Once | ORAL | Status: AC
  Administered 2015-05-19: 0.25 mg via ORAL
  Filled 2015-05-19: qty 1

## 2015-05-19 MED ORDER — CEFTRIAXONE SODIUM 250 MG IJ SOLR
250.0000 mg | Freq: Once | INTRAMUSCULAR | Status: AC
  Administered 2015-05-19: 250 mg via INTRAMUSCULAR
  Filled 2015-05-19: qty 250
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
## 2015-05-19 NOTE — ED Notes (Signed)
[employer] has taken patient into custody.

## 2015-05-19 NOTE — Discharge Instructions (Signed)
Bacterial Vaginosis Bacterial vaginosis is a vaginal infection that occurs when the normal balance of bacteria in the vagina is disrupted. It results from an overgrowth of certain bacteria. This is the most common vaginal infection in women of childbearing age. Treatment is important to prevent complications, especially in pregnant women, as it can cause a premature delivery. CAUSES  Bacterial vaginosis is caused by an increase in harmful bacteria that are normally present in smaller amounts in the vagina. Several different kinds of bacteria can cause bacterial vaginosis. However, the reason that the condition develops is not fully understood. RISK FACTORS Certain activities or behaviors can put you at an increased risk of developing bacterial vaginosis, including:  Having a new sex partner or multiple sex partners.  Douching.  Using an intrauterine device (IUD) for contraception. Women do not get bacterial vaginosis from toilet seats, bedding, swimming pools, or contact with objects around them. SIGNS AND SYMPTOMS  Some women with bacterial vaginosis have no signs or symptoms. Common symptoms include:  Grey vaginal discharge.  A fishlike odor with discharge, especially after sexual intercourse.  Itching or burning of the vagina and vulva.  Burning or pain with urination. DIAGNOSIS  Your health care provider will take a medical history and examine the vagina for signs of bacterial vaginosis. A sample of vaginal fluid may be taken. Your health care provider will look at this sample under a microscope to check for bacteria and abnormal cells. A vaginal pH test may also be done.  TREATMENT  Bacterial vaginosis may be treated with antibiotic medicines. These may be given in the form of a pill or a vaginal cream. A second round of antibiotics may be prescribed if the condition comes back after treatment.  HOME CARE INSTRUCTIONS   Only take over-the-counter or prescription medicines as  directed by your health care provider.  If antibiotic medicine was prescribed, take it as directed. Make sure you finish it even if you start to feel better.  Do not have sex until treatment is completed.  Tell all sexual partners that you have a vaginal infection. They should see their health care provider and be treated if they have problems, such as a mild rash or itching.  Practice safe sex by using condoms and only having one sex partner. SEEK MEDICAL CARE IF:   Your symptoms are not improving after 3 days of treatment.  You have increased discharge or pain.  You have a fever. MAKE SURE YOU:   Understand these instructions.  Will watch your condition.  Will get help right away if you are not doing well or get worse. FOR MORE INFORMATION  Centers for Disease Control and Prevention, Division of STD Prevention: www.cdc.gov/std American Sexual Health Association (ASHA): www.ashastd.org  Document Released: 11/01/2005 Document Revised: 08/22/2013 Document Reviewed: 06/13/2013 ExitCare Patient Information 2015 [employer]. This information is not intended to replace advice given to you by your health care provider. Make sure you discuss any questions you have with your health care provider.  

## 2015-05-19 NOTE — ED Notes (Signed)
Ambulated patient in hall. Patient tolerated well

## 2015-05-20 LAB — URINE CULTURE

## 2015-05-20 LAB — GC/CHLAMYDIA PROBE AMP (~~LOC~~) NOT AT ARMC
Chlamydia: NEGATIVE
Neisseria Gonorrhea: NEGATIVE

## 2015-12-31 ENCOUNTER — Emergency Department (HOSPITAL_COMMUNITY)
Admission: EM | Admit: 2015-12-31 | Discharge: 2016-01-01 | Disposition: A | Discharge status: Home / Self Care | Payer: Self-pay | Attending: Emergency Medicine | Admitting: Emergency Medicine

## 2015-12-31 ENCOUNTER — Emergency Department (HOSPITAL_COMMUNITY): Payer: Self-pay

## 2015-12-31 ENCOUNTER — Encounter (HOSPITAL_COMMUNITY): Payer: Self-pay | Admitting: Emergency Medicine

## 2015-12-31 DIAGNOSIS — F1410 Cocaine abuse, uncomplicated: Secondary | ICD-10-CM | POA: Insufficient documentation

## 2015-12-31 DIAGNOSIS — Z8619 Personal history of other infectious and parasitic diseases: Secondary | ICD-10-CM | POA: Insufficient documentation

## 2015-12-31 DIAGNOSIS — F17210 Nicotine dependence, cigarettes, uncomplicated: Secondary | ICD-10-CM | POA: Insufficient documentation

## 2015-12-31 DIAGNOSIS — R011 Cardiac murmur, unspecified: Secondary | ICD-10-CM | POA: Insufficient documentation

## 2015-12-31 DIAGNOSIS — Z79899 Other long term (current) drug therapy: Secondary | ICD-10-CM | POA: Insufficient documentation

## 2015-12-31 DIAGNOSIS — I10 Essential (primary) hypertension: Secondary | ICD-10-CM | POA: Insufficient documentation

## 2015-12-31 DIAGNOSIS — G40909 Epilepsy, unspecified, not intractable, without status epilepticus: Secondary | ICD-10-CM | POA: Insufficient documentation

## 2015-12-31 DIAGNOSIS — R109 Unspecified abdominal pain: Secondary | ICD-10-CM | POA: Insufficient documentation

## 2015-12-31 DIAGNOSIS — F1210 Cannabis abuse, uncomplicated: Secondary | ICD-10-CM | POA: Insufficient documentation

## 2015-12-31 DIAGNOSIS — Z880 Allergy status to penicillin: Secondary | ICD-10-CM | POA: Insufficient documentation

## 2015-12-31 DIAGNOSIS — B349 Viral infection, unspecified: Secondary | ICD-10-CM | POA: Insufficient documentation

## 2015-12-31 DIAGNOSIS — F1110 Opioid abuse, uncomplicated: Secondary | ICD-10-CM | POA: Insufficient documentation

## 2015-12-31 DIAGNOSIS — E860 Dehydration: Secondary | ICD-10-CM | POA: Insufficient documentation

## 2015-12-31 DIAGNOSIS — Z3202 Encounter for pregnancy test, result negative: Secondary | ICD-10-CM | POA: Insufficient documentation

## 2015-12-31 LAB — I-STAT TROPONIN, ED: Troponin i, poc: 0 ng/mL (ref 0.00–0.08)

## 2015-12-31 LAB — CBC
HEMATOCRIT: 41.3 % (ref 36.0–46.0)
Hemoglobin: 14.2 g/dL (ref 12.0–15.0)
MCH: 29 pg (ref 26.0–34.0)
MCHC: 34.4 g/dL (ref 30.0–36.0)
MCV: 84.5 fL (ref 78.0–100.0)
PLATELETS: 249 10*3/uL (ref 150–400)
RBC: 4.89 MIL/uL (ref 3.87–5.11)
RDW: 13.3 % (ref 11.5–15.5)
WBC: 9.1 10*3/uL (ref 4.0–10.5)

## 2015-12-31 LAB — BASIC METABOLIC PANEL
ANION GAP: 11 (ref 5–15)
BUN: 8 mg/dL (ref 6–20)
CALCIUM: 9.3 mg/dL (ref 8.9–10.3)
CO2: 25 mmol/L (ref 22–32)
Chloride: 106 mmol/L (ref 101–111)
Creatinine, Ser: 0.76 mg/dL (ref 0.44–1.00)
Glucose, Bld: 101 mg/dL — ABNORMAL HIGH (ref 65–99)
Potassium: 4.2 mmol/L (ref 3.5–5.1)
SODIUM: 142 mmol/L (ref 135–145)

## 2015-12-31 MED ORDER — LORAZEPAM 2 MG/ML IJ SOLN
1.0000 mg | Freq: Once | INTRAMUSCULAR | Status: AC
  Administered 2016-01-01: 1 mg via INTRAVENOUS
  Filled 2015-12-31: qty 1

## 2015-12-31 MED ORDER — SODIUM CHLORIDE 0.9 % IV BOLUS (SEPSIS)
1000.0000 mL | Freq: Once | INTRAVENOUS | Status: AC
  Administered 2016-01-01: 1000 mL via INTRAVENOUS

## 2015-12-31 MED ORDER — ONDANSETRON HCL 4 MG/2ML IJ SOLN
4.0000 mg | Freq: Once | INTRAMUSCULAR | Status: AC
  Administered 2016-01-01: 4 mg via INTRAVENOUS
  Filled 2015-12-31: qty 2
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
## 2016-01-01 LAB — URINALYSIS, ROUTINE W REFLEX MICROSCOPIC
Bilirubin Urine: NEGATIVE
Glucose, UA: NEGATIVE mg/dL
Hgb urine dipstick: NEGATIVE
Ketones, ur: NEGATIVE mg/dL
Nitrite: NEGATIVE
Protein, ur: NEGATIVE mg/dL
SPECIFIC GRAVITY, URINE: 1.034 — AB (ref 1.005–1.030)
pH: 6 (ref 5.0–8.0)

## 2016-01-01 LAB — PREGNANCY, URINE: PREG TEST UR: NEGATIVE

## 2016-01-01 LAB — URINE MICROSCOPIC-ADD ON: RBC / HPF: NONE SEEN RBC/hpf (ref 0–5)

## 2016-01-01 LAB — RAPID URINE DRUG SCREEN, HOSP PERFORMED
Amphetamines: NOT DETECTED
BENZODIAZEPINES: NOT DETECTED
Barbiturates: NOT DETECTED
Cocaine: POSITIVE — AB
Opiates: POSITIVE — AB
Tetrahydrocannabinol: POSITIVE — AB

## 2016-01-01 NOTE — Discharge Instructions (Signed)
YOU CAN BE DISCHARGED OME AND SHOULD FOLLOW UP WITH A PRIMARY CARE PROVIDER FOR ANY PERSISTENT SYMPTOMS.   Polysubstance Abuse When people abuse more than one drug or type of drug it is called polysubstance or polydrug abuse. For example, many smokers also drink alcohol. This is one form of polydrug abuse. Polydrug abuse also refers to the use of a drug to counteract an unpleasant effect produced by another drug. It may also be used to help with withdrawal from another drug. People who take stimulants may become agitated. Sometimes this agitation is countered with a tranquilizer. This helps protect against the unpleasant side effects. Polydrug abuse also refers to the use of different drugs at the same time.  Anytime drug use is interfering with normal living activities, it has become abuse. This includes problems with family and friends. Psychological dependence has developed when your mind tells you that the drug is needed. This is usually followed by physical dependence which has developed when continuing increases of drug are required to get the same feeling or "high". This is known as addiction or chemical dependency. A person's risk is much higher if there is a history of chemical dependency in the family. SIGNS OF CHEMICAL DEPENDENCY  You have been told by friends or family that drugs have become a problem.  You fight when using drugs.  You are having blackouts (not remembering what you do while using).  You feel sick from using drugs but continue using.  You lie about use or amounts of drugs (chemicals) used.  You need chemicals to get you going.  You are suffering in work performance or in school because of drug use.  You get sick from use of drugs but continue to use anyway.  You need drugs to relate to people or feel comfortable in social situations.  You use drugs to forget problems. "Yes" answered to any of the above signs of chemical dependency indicates there are problems.  The longer the use of drugs continues, the greater the problems will become. If there is a family history of drug or alcohol use, it is best not to experiment with these drugs. Continual use leads to tolerance. After tolerance develops more of the drug is needed to get the same feeling. This is followed by addiction. With addiction, drugs become the most important part of life. It becomes more important to take drugs than participate in the other usual activities of life. This includes relating to friends and family. Addiction is followed by dependency. Dependency is a condition where drugs are now needed not just to get high, but to feel normal. Addiction cannot be cured but it can be stopped. This often requires outside help and the care of professionals. Treatment centers are listed in the yellow pages under: Cocaine, Narcotics, and Alcoholics Anonymous. Most hospitals and clinics can refer you to a specialized care center. Talk to your caregiver if you need help.   This information is not intended to replace advice given to you by your health care provider. Make sure you discuss any questions you have with your health care provider.   Document Released: 06/23/2005 Document Revised: 01/24/2012 Document Reviewed: 11/06/2014 Elsevier Interactive Patient Education [employer]

## 2016-01-01 NOTE — ED Provider Notes (Signed)
CSN: [id number]     Arrival date & time 12/31/15  1927 History   First MD Initiated Contact with Patient 12/31/15 2316     Chief Complaint  Patient presents with  . Chest Pain     (Consider location/radiation/quality/duration/timing/severity/associated sxs/prior Treatment) HPI.... Patient complains of nausea and vomiting c dehydration for 2 days. She thinks she picked up "the flu".  Review systems positive for chest pain, right flank pain, questionable fever. She has been using intravenous drugs recently. Past medical history positive for drug abuse, hepatitis C, hypertension, seizures.  Past Medical History  Diagnosis Date  . Hypertension   . Heart murmur   . Seizures (HCC)   . Cocaine abuse 01/06/2015  . Hepatitis C, chronic (HCC)    Past Surgical History  Procedure Laterality Date  . No past surgeries    . I&d extremity Right 01/07/2015    Procedure: IRRIGATION AND DEBRIDEMENT EXTREMITY;  Surgeon: Dominica Severin, MD;  Location: [facility] OR;  Service: Orthopedics;  Laterality: Right;   Family History  Problem Relation Age of Onset  . Cancer Mother   . Diabetes Mother   . Hypertension Father    Social History  Substance Use Topics  . Smoking status: Current Every Day Smoker -- 0.50 packs/day    Types: Cigarettes  . Smokeless tobacco: None  . Alcohol Use: No     Comment: patient says she quit alcohol 1 month ago   OB History    Gravida Para Term Preterm AB TAB SAB Ectopic Multiple Living   Review of Systems  All other systems reviewed and are negative.     Allergies  Amoxicillin and Penicillins cross reactors  Home Medications   Prior to Admission medications   Medication Sig Start Date End Date Taking? Authorizing Provider  citalopram (CELEXA) 40 MG tablet Take 1 tablet (40 mg total) by mouth daily. 11/27/14  Yes Bethann Berkshire, MD  gabapentin (NEURONTIN) 300 MG capsule Take 600 mg by mouth 3 (three) times daily.    Yes Historical Provider, MD   ibuprofen (ADVIL,MOTRIN) 200 MG tablet Take 200 mg by mouth every 6 (six) hours as needed for mild pain or moderate pain.   Yes Historical Provider, MD  lamoTRIgine (LAMICTAL) 200 MG tablet Take 200 mg by mouth at bedtime.   Yes Historical Provider, MD  zolpidem (AMBIEN) 5 MG tablet Take 1 tablet (5 mg total) by mouth at bedtime as needed for sleep. 01/16/15  Yes Alexa Dulcy Fanny, MD   BP 109/88 mmHg  Pulse 82  Temp(Src) 98.2 F (36.8 C) (Oral)  Resp 16  SpO2 98%  LMP 12/29/2015 (Exact Date) Physical Exam  Constitutional: She is oriented to person, place, and time.  Alert, looks dehydrated  HENT:  Head: Normocephalic and atraumatic.  Eyes: Conjunctivae and EOM are normal. Pupils are equal, round, and reactive to light.  Neck: Normal range of motion. Neck supple.  Cardiovascular: Normal rate and regular rhythm.   Pulmonary/Chest: Effort normal and breath sounds normal.  Abdominal: Soft. Bowel sounds are normal.  Musculoskeletal: Normal range of motion.  Neurological: She is alert and oriented to person, place, and time.  Skin: Skin is warm and dry.  Psychiatric: She has a normal mood and affect. Her behavior is normal.  Nursing note and vitals reviewed.   ED Course  Procedures (including critical care time) Labs Review Labs Reviewed  BASIC METABOLIC PANEL - Abnormal; Notable for  the following:    Glucose, Bld 101 (*)    All other components within normal limits  CBC  PREGNANCY, URINE  URINE RAPID DRUG SCREEN, HOSP PERFORMED  URINALYSIS, ROUTINE W REFLEX MICROSCOPIC (NOT AT [facility])  Rosezena Sensor, ED    Imaging Review Dg Chest 2 View  12/31/2015  CLINICAL DATA:  30 year old female with central chest pressure and shortness of breath EXAM: CHEST  2 VIEW COMPARISON:  Prior chest x-ray 04/23/2015 FINDINGS: The lungs are clear and negative for focal airspace consolidation, pulmonary edema or suspicious pulmonary nodule. No pleural effusion or pneumothorax. Cardiac and  mediastinal contours are within normal limits. No acute fracture or lytic or blastic osseous lesions. The visualized upper abdominal bowel gas pattern is unremarkable. IMPRESSION: Negative chest x-ray. Electronically Signed   By: Malachy Moan M.D.   On: 12/31/2015 20:28   I have personally reviewed and evaluated these images and lab results as part of my medical decision-making.   EKG Interpretation   Date/Time:  Wednesday December 31 2015 19:42:07 EST Ventricular Rate:  80 PR Interval:  106 QRS Duration: 84 QT Interval:  392 QTC Calculation: 452 R Axis:   86 Text Interpretation:  Sinus rhythm with short PR T wave abnormality,  consider anterior ischemia Abnormal ECG Confirmed by Adriana Simas  MD, Zuleyka Kloc  [id number]) on 12/31/2015 11:24:39 PM Also confirmed by Adriana Simas  MD, Donn Zanetti ([id number])   on 12/31/2015 11:44:51 PM      MDM   Final diagnoses:  Viral syndrome  Dehydration    I suspect patient has a viral syndrome along with drug abuse issues. Will hydrate and check basic labs including urinalysis.  2 L of IV fluids ordered along with Zofran and Ativan. Discussed with S Upstill PA-C who will f/u test results.    Donnetta Hutching, MD 01/01/16 [phone]

## 2016-01-01 NOTE — ED Notes (Signed)
Pt called out and asked for apple sauce, graham crackers, peanut butter and chocolate milk. Pt given warm blanket and peanut butter with chocolate milk and graham crackers.

## 2016-01-01 NOTE — ED Notes (Signed)
Pt verbalized understanding of d/c instructions and has no further questions. Pt stable and NAD. Pt escorted to waiting room by wheelchair to wait for bus.
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
## 2016-05-11 ENCOUNTER — Emergency Department (HOSPITAL_COMMUNITY)
Admission: EM | Admit: 2016-05-11 | Discharge: 2016-05-12 | Disposition: A | Discharge status: Home / Self Care | Payer: Self-pay | Attending: Emergency Medicine | Admitting: Emergency Medicine

## 2016-05-11 ENCOUNTER — Encounter (HOSPITAL_COMMUNITY): Payer: Self-pay

## 2016-05-11 DIAGNOSIS — F1193 Opioid use, unspecified with withdrawal: Secondary | ICD-10-CM

## 2016-05-11 DIAGNOSIS — I10 Essential (primary) hypertension: Secondary | ICD-10-CM | POA: Insufficient documentation

## 2016-05-11 DIAGNOSIS — F1290 Cannabis use, unspecified, uncomplicated: Secondary | ICD-10-CM | POA: Insufficient documentation

## 2016-05-11 DIAGNOSIS — F1410 Cocaine abuse, uncomplicated: Secondary | ICD-10-CM | POA: Insufficient documentation

## 2016-05-11 DIAGNOSIS — F17210 Nicotine dependence, cigarettes, uncomplicated: Secondary | ICD-10-CM | POA: Insufficient documentation

## 2016-05-11 DIAGNOSIS — F1123 Opioid dependence with withdrawal: Secondary | ICD-10-CM | POA: Insufficient documentation

## 2016-05-11 DIAGNOSIS — T7421XA Adult sexual abuse, confirmed, initial encounter: Secondary | ICD-10-CM | POA: Insufficient documentation

## 2016-05-11 DIAGNOSIS — Z8669 Personal history of other diseases of the nervous system and sense organs: Secondary | ICD-10-CM | POA: Insufficient documentation

## 2016-05-11 DIAGNOSIS — R102 Pelvic and perineal pain: Secondary | ICD-10-CM | POA: Insufficient documentation

## 2016-05-11 DIAGNOSIS — F1990 Other psychoactive substance use, unspecified, uncomplicated: Secondary | ICD-10-CM | POA: Insufficient documentation
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
## 2016-05-11 NOTE — ED Notes (Signed)
Pt complains of vaginal and rectal pain and lower abdominal pain, pt states that her periods are irregular because she does heroin

## 2016-05-11 NOTE — ED Notes (Signed)
Nehemiah SettleBrooke, SANE RN is on the way
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
## 2016-05-12 ENCOUNTER — Ambulatory Visit (HOSPITAL_COMMUNITY)
Admission: EM | Admit: 2016-05-12 | Discharge: 2016-05-12 | Disposition: A | Discharge status: Home / Self Care | Payer: No Typology Code available for payment source | Source: Ambulatory Visit | Attending: Emergency Medicine | Admitting: Emergency Medicine

## 2016-05-12 DIAGNOSIS — Z79899 Other long term (current) drug therapy: Secondary | ICD-10-CM | POA: Diagnosis not present

## 2016-05-12 DIAGNOSIS — Z87891 Personal history of nicotine dependence: Secondary | ICD-10-CM | POA: Diagnosis not present

## 2016-05-12 DIAGNOSIS — Z833 Family history of diabetes mellitus: Secondary | ICD-10-CM | POA: Insufficient documentation

## 2016-05-12 DIAGNOSIS — Z880 Allergy status to penicillin: Secondary | ICD-10-CM | POA: Diagnosis not present

## 2016-05-12 DIAGNOSIS — Z8619 Personal history of other infectious and parasitic diseases: Secondary | ICD-10-CM | POA: Diagnosis not present

## 2016-05-12 DIAGNOSIS — Z8249 Family history of ischemic heart disease and other diseases of the circulatory system: Secondary | ICD-10-CM | POA: Diagnosis not present

## 2016-05-12 DIAGNOSIS — Z0441 Encounter for examination and observation following alleged adult rape: Secondary | ICD-10-CM | POA: Insufficient documentation

## 2016-05-12 LAB — I-STAT BETA HCG BLOOD, ED (MC, WL, AP ONLY): I-stat hCG, quantitative: <5 m[IU]/mL (ref <5)

## 2016-05-12 MED ORDER — AZITHROMYCIN 250 MG PO TABS
1000.0000 mg | ORAL_TABLET | Freq: Once | ORAL | Status: AC
  Administered 2016-05-12: 1000 mg via ORAL
  Filled 2016-05-12: qty 4

## 2016-05-12 MED ORDER — ULIPRISTAL ACETATE 30 MG PO TABS
30.0000 mg | ORAL_TABLET | Freq: Once | ORAL | Status: AC
  Administered 2016-05-12: 30 mg via ORAL
  Filled 2016-05-12: qty 1

## 2016-05-12 MED ORDER — KETOROLAC TROMETHAMINE 30 MG/ML IJ SOLN
60.0000 mg | Freq: Once | INTRAMUSCULAR | Status: AC
  Administered 2016-05-12: 60 mg via INTRAMUSCULAR
  Filled 2016-05-12: qty 2

## 2016-05-12 MED ORDER — LIDOCAINE HCL (PF) 1 % IJ SOLN
0.9000 mL | Freq: Once | INTRAMUSCULAR | Status: AC
  Administered 2016-05-12: 30 mL
  Filled 2016-05-12: qty 30

## 2016-05-12 MED ORDER — CEFTRIAXONE SODIUM 250 MG IJ SOLR
250.0000 mg | Freq: Once | INTRAMUSCULAR | Status: AC
  Administered 2016-05-12: 250 mg via INTRAMUSCULAR
  Filled 2016-05-12: qty 250

## 2016-05-12 MED ORDER — LOPERAMIDE HCL 2 MG PO CAPS: 2.0000 mg | ORAL_CAPSULE | Freq: Four times a day (QID) | ORAL | Status: AC | PRN

## 2016-05-12 MED ORDER — ACETAMINOPHEN 500 MG PO TABS: 1000.0000 mg | ORAL_TABLET | Freq: Four times a day (QID) | ORAL | Status: AC | PRN

## 2016-05-12 MED ORDER — DICYCLOMINE HCL 20 MG PO TABS: 20.0000 mg | ORAL_TABLET | Freq: Three times a day (TID) | ORAL | Status: AC

## 2016-05-12 MED ORDER — ONDANSETRON 4 MG PO TBDP
4.0000 mg | ORAL_TABLET | Freq: Once | ORAL | Status: AC
  Administered 2016-05-12: 4 mg via ORAL
  Filled 2016-05-12: qty 1

## 2016-05-12 MED ORDER — METRONIDAZOLE 500 MG PO TABS
2000.0000 mg | ORAL_TABLET | Freq: Once | ORAL | Status: AC
  Administered 2016-05-12: 2000 mg via ORAL
  Filled 2016-05-12: qty 4

## 2016-05-12 MED ORDER — ONDANSETRON 4 MG PO TBDP: 4.0000 mg | ORAL_TABLET | Freq: Three times a day (TID) | ORAL | Status: AC | PRN

## 2016-05-12 NOTE — Discharge Instructions (Signed)
Sexual Assault or Rape °Sexual assault is any sexual activity that a person is forced, threatened, or coerced into participating in. It may or may not involve physical contact. You are being sexually abused if you are forced to have sexual contact of any kind. Sexual assault is called rape if penetration has occurred (vaginal, oral, or anal). Many times, sexual assaults are committed by a friend, relative, or associate. Sexual assault and rape are never the victim's fault.  °Sexual assault can result in various health problems for the person who was assaulted. Some of these problems include: °· Physical injuries in the genital area or other areas of the body. °· Risk of unwanted pregnancy. °· Risk of sexually transmitted infections (STIs). °· Psychological problems such as anxiety, depression, or posttraumatic stress disorder. °WHAT STEPS SHOULD BE TAKEN AFTER A SEXUAL ASSAULT? °If you have been sexually assaulted, you should take the following steps as soon as possible: °· Go to a safe area as quickly as possible and call your local emergency services (911 in [place] Get away from the area where you have been attacked.   °· Do not wash, shower, comb your hair, or clean any part of your body.   °· Do not change your clothes.   °· Do not remove or touch anything in the area where you were assaulted.   °· Go to an emergency room for a complete physical exam. Get the necessary tests to protect yourself from STIs or pregnancy. You may be treated for an STI even if no signs of one are present. Emergency contraceptive medicines are also available to help prevent pregnancy, if this is desired. You may need to be examined by a specially trained health care provider. °· Have the health care provider collect evidence during the exam, even if you are not sure if you will file a report with the police. °· Find out how to file the correct papers with the authorities. This is important for all assaults, even if they were committed  by a family member or friend. °· Find out where you can get additional help and support, such as a local rape crisis center. °· Follow up with your health care provider as directed.   °HOW CAN YOU REDUCE THE CHANCES OF SEXUAL ASSAULT? °Take the following steps to help reduce your chances of being sexually assaulted: °· Consider carrying mace or pepper spray for protection against an attacker.   °· Consider taking a self-defense course. °· Do not try to fight off an attacker if he or she has a gun or knife.   °· Be aware of your surroundings, what is happening around you, and who might be there.   °· Be assertive, trust your instincts, and walk with confidence and direction. °· Be careful not to drink too much alcohol or use other intoxicants. These can reduce your ability to fight off an assault. °· Always lock your doors and windows. Be sure to have high-quality locks for your home.   °· Do not let people enter your house if you do not know them.   °· Get a home security system that has a siren if you are able.   °· Protect the keys to your house and car. Do not lend them out. Do not put your name and address on them. If you lose them, get your locks changed.   °· Always lock your car and have your key ready to open the door before approaching the car.   °· Park in a well-lit and busy area. °· Plan your driving routes   so that you travel on well-lit and frequently used streets.  Keep your car serviced. Always have at least half a tank of gas in it.   Do not go into isolated areas alone. This includes open garages, empty buildings or offices, or [employer].   Do not walk or jog alone, especially when it is dark.   Never hitchhike.   If your car breaks down, call the police for help on your cell phone and stay inside the car with your doors locked and windows up.   If you are being followed, go to a busy area and call for help.   If you are stopped by a police officer, especially one in  an unmarked police car, keep your door locked. Do not put your window down all the way. Ask the officer to show you identification first.   Be aware of "date rape drugs" that can be placed in a drink when you are not looking. These drugs can make you unable to fight off an assault. FOR MORE INFORMATION  Office on [employer], U.S. Department of Health and Human Services: SecretaryNews.ca  National Sexual Assault Hotline: 1-800-656-HOPE (223)587-5969)  National Domestic Violence Hotline: 1-800-799-SAFE (519)136-9577) or www.thehotline.org   This information is not intended to replace advice given to you by your health care provider. Make sure you discuss any questions you have with your health care provider.   Document Released: 10/29/2000 Document Revised: 07/04/2013 Document Reviewed: 06/06/2015 Elsevier Interactive Patient Education 2016 Elsevier Inc.  Opioid Withdrawal Opioids are a group of narcotic drugs. They include the street drug heroin. They also include pain medicines, such as morphine, hydrocodone, oxycodone, and fentanyl. Opioid withdrawal is a group of characteristic physical and mental signs and symptoms. It typically occurs if you have been using opioids daily for several weeks or longer and stop using or rapidly decrease use. Opioid withdrawal can also occur if you have used opioids daily for a long time and are given a medicine to block the effect.  SIGNS AND SYMPTOMS Opioid withdrawal includes three or more of the following symptoms:   Depressed, anxious, or irritable mood.  Nausea or vomiting.  Muscle aches or spasms.   Watery eyes.   Runny nose.  Dilated pupils, sweating, or hairs standing on end.  Diarrhea or intestinal cramping.  Yawning.   Fever.  Increased blood pressure.  Fast pulse.  Restlessness or trouble sleeping. These signs and symptoms occur within several hours of stopping  or reducing short-acting opioids, such as heroin. They can occur within 3 days of stopping or reducing long-acting opioids, such as methadone. Withdrawal begins within minutes of receiving a drug that blocks the effects of opioids, such as naltrexone or naloxone. DIAGNOSIS  Opioid use disorder is diagnosed by your health care provider. You will be asked about your symptoms, drug and alcohol use, medical history, and use of medicines. A physical exam may be done. Lab tests may be ordered. Your health care provider may have you see a mental health professional.  TREATMENT  The treatment for opioid withdrawal is usually provided by medical doctors with special training in substance use disorders (addiction specialists). The following medicines may be included in treatment:  Opioids given in place of the abused opioid. They turn on opioid receptors in the brain and lessen or prevent withdrawal symptoms. They are gradually decreased (opioid substitution and taper).  Non-opioids that can lessen certain opioid withdrawal symptoms. They may be used alone or with opioid substitution and  taper. Successful long-term recovery usually requires medicine, counseling, and group support. HOME CARE INSTRUCTIONS   Take medicines only as directed by your health care provider.  Check with your health care provider before starting new medicines.  Keep all follow-up visits as directed by your health care provider. SEEK MEDICAL CARE IF:  You are not able to take your medicines as directed.  Your symptoms get worse.  You relapse. SEEK IMMEDIATE MEDICAL CARE IF:  You have serious thoughts about hurting yourself or others.  You have a seizure.  You lose consciousness.   This information is not intended to replace advice given to you by your health care provider. Make sure you discuss any questions you have with your health care provider.   Document Released: 11/04/2003 Document Revised: 11/22/2014 Document  Reviewed: 11/14/2013 Elsevier Interactive Patient Education 2016 [employer].   [employer] Guide Outpatient Counseling/Substance Abuse Adult The United Ways 211 is a great source of information about community services available.  Access by dialing 2-1-1 from anywhere in [place], or by website -  PooledIncome.pl.   Other Local Resources (Updated 11/2015)  Crisis Hotlines   Services     Area Served  [employer]  Crisis Hotline, available 24 hours a day, 7 days a week: 440-665-3021 [facility], [place]   [facility]  Crisis Hotline, available 24 hours a day, 7 days a week: [phone] [facility], [place]  [facility]  Suicide Prevention Hotline, available 24 hours a day, 7 days a week: 639 009 8184 [facility], [place]  [employer], available 24 hours a day, 7 days a week: (209) [phone] [facility], [place]   [facility] Access to [employer], available 24 hours a day, 7 days a week: 478-395-4722 All   [facility]  Crisis Hotline, available 24 hours a day, 7 days a week: (218)393-0349 All   Other Local Resources (Updated 11/2015)  Outpatient Counseling/ Substance Abuse Programs  Services     Address and Phone Number  ADS (Alcohol and Drug Services)   Options include Individual counseling, group counseling, intensive outpatient program (several hours a day, several days a week)  Offers depression assessments  Provides methadone maintenance program [phone] [address] [place], [place] [address]   [facility]   Offers partial hospitalization/day treatment and DUI/DWI programs  [employer], private insurance [phone] [address], [address] [place], [place] [address]  [facility]    Services include intensive outpatient program (several hours a day, several days a week), outpatient treatment, DUI/DWI services, family  education  Also has some services specifically for [employer] transitional housing  [phone] [address] [place], [place] [address]     [facility]  [employer], private pay, and private insurance [phone] [address] [place], [place] [address]  [employer] of Care  Services include individual counseling, substance abuse intensive outpatient program (several hours a day, several days a week), day treatment  Delene Loll, Medicaid, private insurance [phone] [address], [address] [place], [place] [address]  [facility]   Offers substance abuse intensive outpatient program (several hours a day, several days a week), partial hospitalization program [phone] [address] [place], [place] [address]  [phone] [address] [place], [place] [address]  [phone] [address] [place], [place] [address]  [phone] 0[address], [address] [place], [place] [address]  [facility]  Individual counseling only  Accepts private insurance only [phone] [address] [place], [place] [address]  [facility]  Methadone maintenanceCrown Holdings [address] [place], [place] [address]  [facility]  Walk-In Clinic providing substance abuse and mental health counseling  Accepts Medicaid, Medicare, private insurance  Offers sliding scale for uninsured [phone] [address] [place], [place]   [facility] in [place], [employer].  Offers individual counseling, and intensive in-home services [phone] [address] [place], [place] [address]  [facility] individual counseling, family counseling, group therapy, domestic violence counseling, consumer credit counseling  Accepts Medicare, Medicaid, private insurance  Offers sliding scale for uninsured  [phone] [address] [place], [place] [address]  [phone] [facility], [address] [place], [place] [address]  [facility]  Offers individual, family and group counseling  3 locations - [place], [place], and [place]  [phone]  [address] [place], [place] [address]  [address] [place], [place] [address]  [address] [place], [place] [address]  [facility] Margo Aye    Offers psychiatric assessment, 8-week Intensive Outpatient Program (several hours a day, several times a week, daytime or evenings), early recovery group, family Program, medication management  Private pay or private insurance only [phone], or  [phone] [address] [place], [place] [address]  [place] [facility] [place] individual, couples and family counseling  Accepts Medicaid, private insurance, and sliding scale for uninsured [phone] [address] [place], [place] [address]  Len Blalock, MD  Individual counseling  Private insurance [phone] [address] [place], [place] [address]  [facility]   Offers assessment, substance abuse treatment, and behavioral health treatment [phone] [address] [place], [place] [address]  [facility]  Individual counseling  Accepts private insurance [phone] [address] [place], [place] [address]  [facility]  Individual counseling  Delene Loll, private insurance [phone] [address] [place], [place] [address]  [facility]    Offers intensive outpatient program (several hours a day, several times a week)  Private pay, private insurance [phone] [facility] [place], [place]  [facility]  Individual counseling  Medicare, private insurance [phone] [address] [place], [place] [address]  [facility]    Offers  intensive outpatient program (several hours a day, several times a week) and partial hospitalization program [phone] [address] [place], [place] [address]  Emerson Monte, MD  Individual counseling [phone] [address] [place], [place] [address]  [facility]  Offers Christian counseling to individuals, couples, and families  Accepts Medicare and private insurance; offers sliding scale for uninsured [phone] [address] [place], [place] [address]  [facility]  [facility] [phone] [address] [place], [place] [address]  [facility] crisis counseling, individual counseling, group therapy, in-home therapy, domestic violence services, day treatment, DWI services, Administrator, arts (CST), Assertive Community Treatment Team (ACTT), substance abuse Intensive Outpatient Program (several hours a day, several times a week)  2 locations - [place] and [place] [phone] [address] [place], [place] [address]  [phone] [address] [place], [place] [address]  [facility]     Individual counseling and group therapy  Accepts private insurance, [place], [place] [phone] [address] [place], [place]  [facility]  Offers individual and family counseling  Nutritional therapistffers LGBTQ services  Accepts private insurance and private pay [phone] [phone] [place], [place] [address]  [facility]    Offers individual counseling, group therapy, and outpatient detox  Accepts private insurance [phone] [address] [place], [place]  [facility]  Individual counseling  Accepts Medicare, private insurance [phone] [address] [place], [place] [address]  [facility]  Individual counseling  Accepts Medicare, private insurance  [phone] [address] [place], [place] [address]  [facility]Zephan[facility]   Offers substance abuse Intensive Outpatient Program (several hours a day, several times a week) [phone], or [phone] [place], [place]    State Street CorporationCommunity Resource Guide Inpatient Behavioral Health/Residential  Substance Abuse Treatment Adults The United Ways 211 is a great source of information about community services available.  Access by dialing 2-1-1 from anywhere in [place], or by website -  PooledIncome.plwww.nc211.org.   (Updated 11/2015)  Crisis Assistance 24 hours a day   Services Offered    Area Lock[employer] MartinServed  [facility]  24-hour crisis assistance: [phone] [place], [place]   [facility]  24-hour crisis [phone] [place], [place]  [facility]   24-hour crisis assistance: [phone] [place], [place]   [facility] Access to Care Line  24-hour crisis assistance; [phone] All   [facility]  24-hour crisis response line: [phone] All   Other Local Resources (Updated 11/2015)  Inpatient Behavioral Health/Residential Substance Abuse Treatment Programs   Services      Address and Phone Number  [facility] ([facility])   14-day residential rehabilitation  [phone] [address] [place], [place]  [facility] ([facility])    Detox - private pay only  14-day residential rehabilitation -  Medicaid, insurance, private pay only [phone], or [phone] [address], [place], [place] [address]   [facility]  Private Insurance only  Multiple facilities [phone] admissions   [facility] ([facility])   90-day program  Must be homeless to participate  [phone], or [phone] [place], [facility]  [facility]     Private Insurance only [phone], or  [phone] [address] [place], [place] [address]    [facility]     Must make an appointment  Transportation is offered from [employer] on [address]  Accepts private pay, Sheryn BisonMedicare, [facility] Medicaid [phone]  [address] [place], [place] [address]   [facility]  Females only  Associated with the [facility] [phone] [address] [place], [place] [address]  [facility]ms[facility]Call   Private insurance only [phone], or [phone] [address] [place], [id number]  [facility]    Detox  Residential rehabilitation  Private insurance only  Multiple locations [phone] admissions  [facility]    Private pay  Private insurance [phone] [address] [place], [place] [id number]  [facility]    Males only  Fee required at time of admission [phone] [address] [place], [place] [address]  [facility]    Private pay only  [phone] [place], [place]  RTS (Residential Treatment Services)    Detox - private pay, Medicaid  Residential rehabilitation for males  - Medicare, Medicaid, insurance, private pay [phone] [address] [place], [place]   [facility]    Walk-in interviews Monday - Saturday from 8 am - 4 pm  Individuals with legal charges are not eligible [phone] [address] [place], [place] [address]  [facility]   Must be willing to work  Must attend Alcoholics Anonymous meetings [phone] [address] [place], [place]   [facility] [employer]    Faith-based program  Private pay only [phone] [address] [place], [place]    [employer] Guide Social Services The United Ways 211 is a great source of information about community services available.  Access by dialing 2-1-1 from anywhere in [place], or by website -  PooledIncome.pl.   Other Local Resources (Updated 11/2015)  Social  Acupuncturist Number and Address  Aging, Disability and Transit Services  In-home services  Meals on wheels  Community meal sites  Transportation services  Adult day health care  Center for Active Retirement  Family caregiver support services [phone] [place], [place]  [place] [employer] of Social Services  Aging and adult services  Childrens services  Deaf-Blind services  Disability services  Guardianship  Hearing loss (assistive technology, interpreters, etc.)  Low-income services (health care, child care, housing, financial and nutrition assistance)  Medicaid  Pregnancy services  Utility assistance  Veterans services [phone] [address] [place], [place] [address]   [facility]  Assessment  Care management  Family education  Information and referral [phone] [address] [place], [place] [address]  [facility] Social Services  Aging and adult services  Childrens services  Deaf-Blind services  Disability services  Guardianship  Hearing loss (assistive technology, interpreters, etc.)  Low-income services (health care, child care, housing, financial and nutrition assistance)  Medicaid  Pregnancy services  Utility assistance  Veterans services [phone] [address] [place], [place] [address]  [facility] Department of Social Services  Aging and adult services  Childrens services  Deaf-Blind services  Disability services  Guardianship  Hearing loss (assistive technology, interpreters, etc.)  Low-income services (health care, child care, housing, financial and nutrition assistance)  [facility]  Pregnancy services  Utility assistance  Veterans services [phone] [address] [place], [place] [address]  [facility] Department of Health and [employer]  Aging and adult services  Childrens services  Deaf-Blind services  Disability  services  Guardianship  Hearing loss (assistive technology, interpreters, etc.)  Low-income services (health care, child care, housing, financial and nutrition assistance)  Medicaid  Pregnancy services  Utility assistance  Veterans services [phone] [address] [place], [place] [address]   [facility] Department of Social Services  Aging and adult services  Childrens services  Deaf-Blind services  Disability services  Guardianship  Hearing loss (assistive technology, interpreters, etc.)  Low-income services (health care, child care, housing, financial and nutrition assistance)  Medicaid  Pregnancy services  Utility assistance  Veterans services [phone] [place], [place] [address]  [facility] Division of Social Services  Aging and adult services  Childrens services  Deaf-Blind services  Disability services  Guardianship  Hearing loss (assistive technology, interpreters, etc.)  Low-income services (health care, child care, housing, financial and nutrition assistance)  Medicaid  Pregnancy services  Utility assistance  Veterans services [phone] 65 [place], [place] [address]  [facility] [place] [place]  Serves adults age 65 and over and their families  Caregiver information  Foster grandparents  Forensic scientist meals  Refugee programs  Retired Herbalist (RSVP)  Chief Strategy Officer [employer] Health Insurance Information Program [facility] [employer])  Financial planner  SeniorLine  Support Groups  TeleCare [phone]  Jorja Lo[address]. [address] [place], [place] [address]  [phone]  [address] [place], [place] [address]  Senior [employer]  Help Line  Home Care  Living at Goldman SachsHome  Meals on Wheels  Senior Lunch Program  Adult Day Center  [facility] Closet  Support Groups  Care Partner  Workshops  Referrals for chore and homemaker services [phone]  [address] [place], [place] [address]  [employer]  Crisis assistance  Medication assistance  Rental assistance  Food pantry  Medication assistance  Housing assistance  Emergency food distribution  Utility assistance  Boys [employer] Girls Clubs [phone] [address] [place], [place]   [phone](Tuesdays and Thursdays from 9am - 12 noon by appointment only) [address] [place], [place] [address]  [phone] [address] [place], [place] [address]     Community Resource Guide Financial Assistance The United Ways 211 is a great source of information about community services available.  Access by dialing 2-1-1 from anywhere in [place], or by website -  PooledIncome.plwww.nc211.org.   Other Local Resources (Updated 11/2015)  Financial Assistance   Services    Phone Number and Address  [facility]  Low-cost medical care - 1st and 3rd Saturday of every month  Must not qualify for public or private insurance and must have limited income [phone] [address] [place], [place]    [place] [employer] Department of Social Services  Child care  Emergency assistance for housing and Kimberly-Clarkutilities  Food stamps  Medicaid [phone] [address] [place], [place] [address]   [facility]  Low-cost medical care for children, communicable diseases, sexually-transmitted diseases, immunizations, maternity care, womens health and family planning [phone] [address] [place], [place] [address]  [facility]   Medication assistance for [facility] residents  Must meet income requirements [phone] [address] [place], [place].    [facility]  Child care  Emergency assistance for housing and Kimberly-Clarkutilities  Food stamps  Medicaid  [phone] [address] [place], [place] [address]  [facility]   Low-cost medical care,   Monday through Friday, 9 am to 6 pm.   Accepts Medicare/Medicaid, and self-pay [phone] [address] [place], [place] [address]  [facility]  Low-cost medical care - Monday through Friday, 8:30 am - 5:30 pm  Accepts Medicaid and self-pay [phone] [address] [place], [place] [address]    [facility]  Primary medical care, including for those with sickle cell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

## 2016-05-12 NOTE — ED Notes (Signed)
Pt given sandwich, graham crackers, and gingerale. 

## 2016-05-12 NOTE — ED Notes (Signed)
SANE RN here for evaluation
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
## 2017-07-17 ENCOUNTER — Emergency Department (HOSPITAL_COMMUNITY): Payer: Self-pay | Admitting: Certified Registered Nurse Anesthetist

## 2017-07-17 ENCOUNTER — Encounter (HOSPITAL_COMMUNITY)
Admission: EM | Disposition: A | Discharge status: Home Health | Payer: Self-pay | Source: Home / Self Care | Attending: Orthopedic Surgery

## 2017-07-17 ENCOUNTER — Inpatient Hospital Stay (HOSPITAL_COMMUNITY)
Admission: EM | Admit: 2017-07-17 | Discharge: 2017-07-19 | DRG: 982 | Disposition: A | Discharge status: Home Health | Payer: Self-pay | Attending: Orthopedic Surgery | Admitting: Orthopedic Surgery

## 2017-07-17 ENCOUNTER — Emergency Department (HOSPITAL_COMMUNITY): Payer: Self-pay

## 2017-07-17 ENCOUNTER — Encounter (HOSPITAL_COMMUNITY): Payer: Self-pay

## 2017-07-17 DIAGNOSIS — Z79891 Long term (current) use of opiate analgesic: Secondary | ICD-10-CM

## 2017-07-17 DIAGNOSIS — F1990 Other psychoactive substance use, unspecified, uncomplicated: Secondary | ICD-10-CM | POA: Diagnosis present

## 2017-07-17 DIAGNOSIS — Z8249 Family history of ischemic heart disease and other diseases of the circulatory system: Secondary | ICD-10-CM

## 2017-07-17 DIAGNOSIS — F1410 Cocaine abuse, uncomplicated: Secondary | ICD-10-CM | POA: Diagnosis present

## 2017-07-17 DIAGNOSIS — B182 Chronic viral hepatitis C: Secondary | ICD-10-CM | POA: Diagnosis present

## 2017-07-17 DIAGNOSIS — I10 Essential (primary) hypertension: Secondary | ICD-10-CM | POA: Diagnosis present

## 2017-07-17 DIAGNOSIS — F1110 Opioid abuse, uncomplicated: Secondary | ICD-10-CM | POA: Diagnosis present

## 2017-07-17 DIAGNOSIS — F17210 Nicotine dependence, cigarettes, uncomplicated: Secondary | ICD-10-CM | POA: Diagnosis present

## 2017-07-17 DIAGNOSIS — L0291 Cutaneous abscess, unspecified: Secondary | ICD-10-CM

## 2017-07-17 DIAGNOSIS — L02519 Cutaneous abscess of unspecified hand: Secondary | ICD-10-CM | POA: Diagnosis present

## 2017-07-17 DIAGNOSIS — Z833 Family history of diabetes mellitus: Secondary | ICD-10-CM

## 2017-07-17 DIAGNOSIS — N390 Urinary tract infection, site not specified: Secondary | ICD-10-CM | POA: Diagnosis present

## 2017-07-17 DIAGNOSIS — L02512 Cutaneous abscess of left hand: Principal | ICD-10-CM | POA: Diagnosis present

## 2017-07-17 HISTORY — PX: I & D EXTREMITY: SHX5045

## 2017-07-17 LAB — I-STAT BETA HCG BLOOD, ED (MC, WL, AP ONLY)

## 2017-07-17 LAB — C-REACTIVE PROTEIN: CRP: 4.8 mg/dL — ABNORMAL HIGH (ref <1.0)

## 2017-07-17 LAB — URINALYSIS, ROUTINE W REFLEX MICROSCOPIC
GLUCOSE, UA: NEGATIVE mg/dL
KETONES UR: 5 mg/dL — AB
Nitrite: NEGATIVE
PROTEIN: 100 mg/dL — AB
Specific Gravity, Urine: 1.033 — ABNORMAL HIGH (ref 1.005–1.030)
pH: 5 (ref 5.0–8.0)

## 2017-07-17 LAB — CBC WITH DIFFERENTIAL/PLATELET
Basophils Absolute: 0 10*3/uL (ref 0.0–0.1)
Basophils Relative: 0 %
EOS ABS: 0.1 10*3/uL (ref 0.0–0.7)
EOS PCT: 1 %
HCT: 39.1 % (ref 36.0–46.0)
Hemoglobin: 13.3 g/dL (ref 12.0–15.0)
LYMPHS ABS: 1.2 10*3/uL (ref 0.7–4.0)
Lymphocytes Relative: 12 %
MCH: 29.6 pg (ref 26.0–34.0)
MCHC: 34 g/dL (ref 30.0–36.0)
MCV: 86.9 fL (ref 78.0–100.0)
MONO ABS: 1 10*3/uL (ref 0.1–1.0)
Monocytes Relative: 10 %
Neutro Abs: 7.4 10*3/uL (ref 1.7–7.7)
Neutrophils Relative %: 77 %
PLATELETS: 309 10*3/uL (ref 150–400)
RBC: 4.5 MIL/uL (ref 3.87–5.11)
RDW: 12.8 % (ref 11.5–15.5)
WBC: 9.8 10*3/uL (ref 4.0–10.5)

## 2017-07-17 LAB — SEDIMENTATION RATE: SED RATE: 30 mm/h — AB (ref 0–22)

## 2017-07-17 LAB — COMPREHENSIVE METABOLIC PANEL
ALBUMIN: 3.8 g/dL (ref 3.5–5.0)
ALT: 44 U/L (ref 14–54)
AST: 59 U/L — AB (ref 15–41)
Alkaline Phosphatase: 56 U/L (ref 38–126)
Anion gap: 13 (ref 5–15)
BILIRUBIN TOTAL: 0.8 mg/dL (ref 0.3–1.2)
BUN: 6 mg/dL (ref 6–20)
CHLORIDE: 104 mmol/L (ref 101–111)
CO2: 21 mmol/L — ABNORMAL LOW (ref 22–32)
CREATININE: 0.7 mg/dL (ref 0.44–1.00)
Calcium: 8.9 mg/dL (ref 8.9–10.3)
GFR calc Af Amer: >60 mL/min (ref >60)
GLUCOSE: 149 mg/dL — AB (ref 65–99)
Potassium: 3.5 mmol/L (ref 3.5–5.1)
Sodium: 138 mmol/L (ref 135–145)
Total Protein: 7.1 g/dL (ref 6.5–8.1)

## 2017-07-17 LAB — RAPID URINE DRUG SCREEN, HOSP PERFORMED
Amphetamines: NOT DETECTED
BARBITURATES: NOT DETECTED
Benzodiazepines: POSITIVE — AB
Cocaine: POSITIVE — AB
Opiates: POSITIVE — AB
TETRAHYDROCANNABINOL: POSITIVE — AB

## 2017-07-17 LAB — URINALYSIS, MICROSCOPIC (REFLEX): Bacteria, UA: NONE SEEN

## 2017-07-17 LAB — I-STAT CG4 LACTIC ACID, ED: LACTIC ACID, VENOUS: 1.28 mmol/L (ref 0.5–1.9)

## 2017-07-17 SURGERY — IRRIGATION AND DEBRIDEMENT EXTREMITY
Anesthesia: General | Laterality: Left

## 2017-07-17 MED ORDER — FENTANYL CITRATE (PF) 100 MCG/2ML IJ SOLN
INTRAMUSCULAR | Status: DC | PRN
  Administered 2017-07-17: 50 ug via INTRAVENOUS
  Administered 2017-07-17: 100 ug via INTRAVENOUS

## 2017-07-17 MED ORDER — ONDANSETRON HCL 4 MG/2ML IJ SOLN
4.0000 mg | Freq: Once | INTRAMUSCULAR | Status: AC
  Administered 2017-07-17: 4 mg via INTRAVENOUS
  Filled 2017-07-17: qty 2

## 2017-07-17 MED ORDER — LACTATED RINGERS IV SOLN
INTRAVENOUS | Status: DC | PRN
  Administered 2017-07-17: 18:00:00 via INTRAVENOUS

## 2017-07-17 MED ORDER — METOCLOPRAMIDE HCL 5 MG/ML IJ SOLN: 5.0000 mg | Freq: Three times a day (TID) | INTRAMUSCULAR | Status: DC | PRN

## 2017-07-17 MED ORDER — ACETAMINOPHEN 650 MG RE SUPP: 650.0000 mg | Freq: Four times a day (QID) | RECTAL | Status: DC | PRN

## 2017-07-17 MED ORDER — SODIUM CHLORIDE 0.9 % IV BOLUS (SEPSIS)
1000.0000 mL | Freq: Once | INTRAVENOUS | Status: AC
  Administered 2017-07-17: 1000 mL via INTRAVENOUS

## 2017-07-17 MED ORDER — QUETIAPINE FUMARATE 50 MG PO TABS
350.0000 mg | ORAL_TABLET | Freq: Every day | ORAL | Status: DC
Start: 2017-07-17 — End: 2017-07-19
  Administered 2017-07-17 – 2017-07-18 (×2): 350 mg via ORAL
  Filled 2017-07-17 (×2): qty 1

## 2017-07-17 MED ORDER — FENTANYL CITRATE (PF) 250 MCG/5ML IJ SOLN
INTRAMUSCULAR | Status: AC
  Filled 2017-07-17: qty 5

## 2017-07-17 MED ORDER — SODIUM CHLORIDE 0.9 % IR SOLN
Status: DC | PRN
  Administered 2017-07-17: 3000 mL

## 2017-07-17 MED ORDER — ACETAMINOPHEN 325 MG PO TABS
650.0000 mg | ORAL_TABLET | Freq: Four times a day (QID) | ORAL | Status: DC | PRN
  Administered 2017-07-17 – 2017-07-19 (×3): 650 mg via ORAL
  Filled 2017-07-17 (×3): qty 2

## 2017-07-17 MED ORDER — DEXAMETHASONE SODIUM PHOSPHATE 4 MG/ML IJ SOLN
INTRAMUSCULAR | Status: DC | PRN
  Administered 2017-07-17: 10 mg via INTRAVENOUS

## 2017-07-17 MED ORDER — FENTANYL CITRATE (PF) 100 MCG/2ML IJ SOLN: 25.0000 ug | INTRAMUSCULAR | Status: DC | PRN

## 2017-07-17 MED ORDER — 0.9 % SODIUM CHLORIDE (POUR BTL) OPTIME
TOPICAL | Status: DC | PRN
  Administered 2017-07-17: 1000 mL

## 2017-07-17 MED ORDER — KETOROLAC TROMETHAMINE 30 MG/ML IJ SOLN
15.0000 mg | Freq: Once | INTRAMUSCULAR | Status: AC
  Administered 2017-07-17: 15 mg via INTRAVENOUS
  Filled 2017-07-17: qty 1

## 2017-07-17 MED ORDER — METHADONE HCL 10 MG PO TABS
80.0000 mg | ORAL_TABLET | Freq: Every day | ORAL | Status: DC
  Administered 2017-07-18 – 2017-07-19 (×2): 80 mg via ORAL
  Filled 2017-07-17 (×2): qty 8

## 2017-07-17 MED ORDER — ACETAMINOPHEN 325 MG PO TABS
650.0000 mg | ORAL_TABLET | Freq: Once | ORAL | Status: DC
  Filled 2017-07-17: qty 2

## 2017-07-17 MED ORDER — PROPOFOL 10 MG/ML IV BOLUS
INTRAVENOUS | Status: AC
  Filled 2017-07-17: qty 20

## 2017-07-17 MED ORDER — METOCLOPRAMIDE HCL 5 MG PO TABS: 5.0000 mg | ORAL_TABLET | Freq: Three times a day (TID) | ORAL | Status: DC | PRN

## 2017-07-17 MED ORDER — ONDANSETRON HCL 4 MG/2ML IJ SOLN
INTRAMUSCULAR | Status: DC | PRN
  Administered 2017-07-17: 4 mg via INTRAVENOUS

## 2017-07-17 MED ORDER — IBUPROFEN 200 MG PO TABS
200.0000 mg | ORAL_TABLET | Freq: Four times a day (QID) | ORAL | Status: DC | PRN
  Administered 2017-07-18 – 2017-07-19 (×4): 200 mg via ORAL
  Filled 2017-07-17 (×4): qty 1

## 2017-07-17 MED ORDER — VANCOMYCIN HCL IN DEXTROSE 1-5 GM/200ML-% IV SOLN
INTRAVENOUS | Status: AC
  Filled 2017-07-17: qty 200

## 2017-07-17 MED ORDER — MIDAZOLAM HCL 5 MG/5ML IJ SOLN
INTRAMUSCULAR | Status: DC | PRN
  Administered 2017-07-17: 2 mg via INTRAVENOUS

## 2017-07-17 MED ORDER — POTASSIUM CHLORIDE IN NACL 20-0.9 MEQ/L-% IV SOLN
INTRAVENOUS | Status: DC
  Administered 2017-07-17: 21:00:00 via INTRAVENOUS
  Filled 2017-07-17 (×2): qty 1000

## 2017-07-17 MED ORDER — OLANZAPINE 5 MG PO TABS
20.0000 mg | ORAL_TABLET | Freq: Every day | ORAL | Status: DC
  Administered 2017-07-17 – 2017-07-18 (×2): 20 mg via ORAL
  Filled 2017-07-17 (×2): qty 4

## 2017-07-17 MED ORDER — LAMOTRIGINE 100 MG PO TABS
150.0000 mg | ORAL_TABLET | Freq: Every day | ORAL | Status: DC
Start: 2017-07-18 — End: 2017-07-19
  Administered 2017-07-18 – 2017-07-19 (×2): 150 mg via ORAL
  Filled 2017-07-17 (×2): qty 2

## 2017-07-17 MED ORDER — PHENYLEPHRINE HCL 10 MG/ML IJ SOLN
INTRAMUSCULAR | Status: DC | PRN
  Administered 2017-07-17 (×2): 80 ug via INTRAVENOUS

## 2017-07-17 MED ORDER — SODIUM CHLORIDE 0.9 % IV SOLN
INTRAVENOUS | Status: DC | PRN
  Administered 2017-07-17: 18:00:00 via INTRAVENOUS

## 2017-07-17 MED ORDER — ONDANSETRON HCL 4 MG/2ML IJ SOLN: 4.0000 mg | Freq: Four times a day (QID) | INTRAMUSCULAR | Status: DC | PRN

## 2017-07-17 MED ORDER — MIDAZOLAM HCL 2 MG/2ML IJ SOLN
INTRAMUSCULAR | Status: AC
  Filled 2017-07-17: qty 2

## 2017-07-17 MED ORDER — DIAZEPAM 2 MG PO TABS
1.0000 mg | ORAL_TABLET | Freq: Four times a day (QID) | ORAL | Status: DC | PRN
  Administered 2017-07-17 – 2017-07-19 (×6): 1 mg via ORAL
  Filled 2017-07-17 (×7): qty 1

## 2017-07-17 MED ORDER — MORPHINE SULFATE (PF) 4 MG/ML IV SOLN
1.0000 mg | INTRAVENOUS | Status: DC | PRN
  Administered 2017-07-17 – 2017-07-19 (×6): 1 mg via INTRAVENOUS
  Filled 2017-07-17 (×6): qty 1

## 2017-07-17 MED ORDER — VANCOMYCIN HCL 1000 MG IV SOLR
INTRAVENOUS | Status: DC | PRN
  Administered 2017-07-17: 1000 mg via INTRAVENOUS

## 2017-07-17 MED ORDER — ONDANSETRON HCL 4 MG PO TABS: 4.0000 mg | ORAL_TABLET | Freq: Four times a day (QID) | ORAL | Status: DC | PRN

## 2017-07-17 SURGICAL SUPPLY — 45 items
BANDAGE ACE 3X5.8 VEL STRL LF (GAUZE/BANDAGES/DRESSINGS) ×2 IMPLANT
BNDG COHESIVE 4X5 TAN STRL (GAUZE/BANDAGES/DRESSINGS) ×3 IMPLANT
BNDG GAUZE ELAST 4 BULKY (GAUZE/BANDAGES/DRESSINGS) ×4 IMPLANT
BNDG GAUZE STRTCH 6 (GAUZE/BANDAGES/DRESSINGS) ×9 IMPLANT
BRUSH SCRUB SURG 4.25 DISP (MISCELLANEOUS) ×2 IMPLANT
COVER SURGICAL LIGHT HANDLE (MISCELLANEOUS) ×6 IMPLANT
DRAPE U-SHAPE 47X51 STRL (DRAPES) ×1 IMPLANT
DRSG ADAPTIC 3X8 NADH LF (GAUZE/BANDAGES/DRESSINGS) ×3 IMPLANT
ELECT REM PT RETURN 9FT ADLT (ELECTROSURGICAL) ×3
ELECTRODE REM PT RTRN 9FT ADLT (ELECTROSURGICAL) IMPLANT
GAUZE IODOFORM PACK 1/2 7832 (GAUZE/BANDAGES/DRESSINGS) ×2 IMPLANT
GAUZE SPONGE 4X4 12PLY STRL (GAUZE/BANDAGES/DRESSINGS) ×3 IMPLANT
GLOVE BIO SURGEON STRL SZ7.5 (GLOVE) ×1 IMPLANT
GLOVE BIO SURGEON STRL SZ8 (GLOVE) ×3 IMPLANT
GLOVE BIOGEL PI IND STRL 7.5 (GLOVE) ×1 IMPLANT
GLOVE BIOGEL PI IND STRL 8 (GLOVE) ×1 IMPLANT
GLOVE BIOGEL PI INDICATOR 7.5 (GLOVE)
GLOVE BIOGEL PI INDICATOR 8 (GLOVE) ×2
GOWN STRL REUS W/ TWL LRG LVL3 (GOWN DISPOSABLE) ×2 IMPLANT
GOWN STRL REUS W/ TWL XL LVL3 (GOWN DISPOSABLE) ×1 IMPLANT
GOWN STRL REUS W/TWL LRG LVL3 (GOWN DISPOSABLE) ×3
GOWN STRL REUS W/TWL XL LVL3 (GOWN DISPOSABLE) ×3
HANDPIECE INTERPULSE COAX TIP (DISPOSABLE)
KIT BASIN OR (CUSTOM PROCEDURE TRAY) ×3 IMPLANT
KIT ROOM TURNOVER OR (KITS) ×3 IMPLANT
MANIFOLD NEPTUNE II (INSTRUMENTS) ×3 IMPLANT
NS IRRIG 1000ML POUR BTL (IV SOLUTION) ×3 IMPLANT
PACK ORTHO EXTREMITY (CUSTOM PROCEDURE TRAY) ×3 IMPLANT
PAD ARMBOARD 7.5X6 YLW CONV (MISCELLANEOUS) ×6 IMPLANT
PAD CAST 4YDX4 CTTN HI CHSV (CAST SUPPLIES) IMPLANT
PADDING CAST COTTON 4X4 STRL (CAST SUPPLIES) ×3
PADDING CAST COTTON 6X4 STRL (CAST SUPPLIES) ×1 IMPLANT
SET HNDPC FAN SPRY TIP SCT (DISPOSABLE) IMPLANT
SPLINT FIBERGLASS 3X12 (CAST SUPPLIES) ×2 IMPLANT
SPONGE LAP 18X18 X RAY DECT (DISPOSABLE) ×1 IMPLANT
STOCKINETTE IMPERVIOUS 9X36 MD (GAUZE/BANDAGES/DRESSINGS) ×1 IMPLANT
SUT PDS AB 2-0 CT1 27 (SUTURE) IMPLANT
SWAB CULTURE ESWAB REG 1ML (MISCELLANEOUS) ×2 IMPLANT
TOWEL OR 17X24 6PK STRL BLUE (TOWEL DISPOSABLE) ×1 IMPLANT
TOWEL OR 17X26 10 PK STRL BLUE (TOWEL DISPOSABLE) ×4 IMPLANT
TUBE CONNECTING 12'X1/4 (SUCTIONS) ×1
TUBE CONNECTING 12X1/4 (SUCTIONS) ×2 IMPLANT
UNDERPAD 30X30 (UNDERPADS AND DIAPERS) ×3 IMPLANT
WATER STERILE IRR 1000ML POUR (IV SOLUTION) ×1 IMPLANT
YANKAUER SUCT BULB TIP NO VENT (SUCTIONS) ×3 IMPLANT

## 2017-07-17 NOTE — Anesthesia Procedure Notes (Signed)
Procedure Name: Intubation Date/Time: 07/17/2017 5:48 PM Performed by: Ollen Bowl Pre-anesthesia Checklist: Patient identified, Emergency Drugs available, Suction available, Patient being monitored and Timeout performed Patient Re-evaluated:Patient Re-evaluated prior to induction Oxygen Delivery Method: Circle system utilized Preoxygenation: Pre-oxygenation with 100% oxygen Induction Type: IV induction, Rapid sequence and Cricoid Pressure applied Laryngoscope Size: Mac and 3 Grade View: Grade I Tube type: Oral Tube size: 7.5 mm Number of attempts: 1 Airway Equipment and Method: Patient positioned with wedge pillow and Stylet Placement Confirmation: ETT inserted through vocal cords under direct vision,  positive ETCO2 and breath sounds checked- equal and bilateral Secured at: 20 cm Tube secured with: Tape Dental Injury: Teeth and Oropharynx as per pre-operative assessment

## 2017-07-17 NOTE — ED Provider Notes (Signed)
[facility]Y DEPT Provider Note   CSN: [id number] Arrival date & time: 07/17/17  1238     History   Chief Complaint Chief Complaint  Patient presents with  . Abscess    HPI [name] is a 31 y.o. female.  HPI  31 year old female with a history of cocaine abuse, IV drug abuse, hepatitis C, prior endocarditis, presents with left hand infection. She states she was bitten by bugs 3 days ago and it has progressively swollen. She states it's gotten to the point where now the pain is severe. Since this morning she's had 3 episodes of emesis and has not been able to keep anything down besides a little bit of Sprite. She's also noticed numbness to the palmar aspect of her fourth and fifth digits and palmar ulnar hand. She has also noticed a little bit of redness around her elbow that has not seen any streaking in between. No fevers although she has felt hot and cold. She adamantly denies shooting up in that hand. She does admit to using cocaine for 5 days ago for someone's party. She is currently on methadone. She's seen some mild white discharge from the area.   Past Medical History:  Diagnosis Date  . Cocaine abuse 01/06/2015  . Heart murmur   . Hepatitis C, chronic (HCC)   . Hypertension   . Seizures [facility])     Patient Active Problem List   Diagnosis Date Noted  . Sepsis (HCC) 04/23/2015  . HCAP (healthcare-associated pneumonia) 04/23/2015  . Hypokalemia 04/23/2015  . Shingles rash 01/12/2015  . Abscess of forearm, right 01/07/2015  . Hepatitis C, chronic (HCC) 01/07/2015  . Heroin abuse   . Gram-positive cocci bacteremia   . Seizure disorder (HCC) 01/06/2015  . Hypertension 01/06/2015  . H/O cardiac murmur 01/06/2015  . IVDU (intravenous drug user) 01/06/2015  . Cocaine abuse 01/06/2015    Past Surgical History:  Procedure Laterality Date  . I&D EXTREMITY Right 01/07/2015   Procedure: IRRIGATION AND DEBRIDEMENT EXTREMITY;  Surgeon: Dominica Severin, MD;  Location: [facility]  OR;  Service: Orthopedics;  Laterality: Right;  . NO PAST SURGERIES      OB History    Gravida Para Term Preterm AB Living   6 4 4   2 4    SAB TAB Ectopic Multiple Live Births   2       4       Home Medications    Prior to Admission medications   Medication Sig Start Date End Date Taking? Authorizing Provider  acetaminophen (TYLENOL) 500 MG tablet Take 2 tablets (1,000 mg total) by mouth every 6 (six) hours as needed. 05/12/16   Ward, Layla Maw, DO  dicyclomine (BENTYL) 20 MG tablet Take 1 tablet (20 mg total) by mouth 3 (three) times daily before meals. As needed for abdominal cramping 05/12/16   Ward, Layla Maw, DO  loperamide (IMODIUM) 2 MG capsule Take 1 capsule (2 mg total) by mouth 4 (four) times daily as needed for diarrhea or loose stools. 05/12/16   Ward, Layla Maw, DO  ondansetron (ZOFRAN ODT) 4 MG disintegrating tablet Take 1 tablet (4 mg total) by mouth every 8 (eight) hours as needed for nausea or vomiting. 05/12/16   Ward, Layla Maw, DO    Family History Family History  Problem Relation Age of Onset  . Cancer Mother   . Diabetes Mother   . Hypertension Father     Social History Social History  Substance Use Topics  . Smoking  status: Current Every Day Smoker    Packs/day: 0.50    Types: Cigarettes  . Smokeless tobacco: Not on file  . Alcohol use No     Comment: patient says she quit alcohol 1 month ago     Allergies   Amoxicillin and Penicillins cross reactors   Review of Systems Review of Systems  Constitutional: Positive for chills. Negative for fever.  Gastrointestinal: Positive for nausea and vomiting.  Skin: Positive for color change. Negative for wound.  Neurological: Positive for numbness. Negative for weakness.  All other systems reviewed and are negative.    Physical Exam Updated Vital Signs BP 134/86   Pulse (!) 120   Temp 98.1 F (36.7 C) (Oral)   Resp 20   SpO2 97%   Physical Exam  Constitutional: She is oriented to person,  place, and time. She appears well-developed and well-nourished.  HENT:  Head: Normocephalic and atraumatic.  Right Ear: External ear normal.  Left Ear: External ear normal.  Nose: Nose normal.  Eyes: Right eye exhibits no discharge. Left eye exhibits no discharge.  Cardiovascular: Normal rate, regular rhythm and normal heart sounds.   Pulses:      Radial pulses are 2+ on the left side.  Pulmonary/Chest: Effort normal.  Musculoskeletal:  Large area of redness and induration as seen in the picture below. This is very tender. No current drainage. She is able to move all 5 fingers and flex and extend versus resistance. There is numbness over the palmar 4/5th digits and ulnar palmar hand Small area of erythema to elbow without swelling or abscess  Neurological: She is alert and oriented to person, place, and time.  Skin: Skin is warm and dry.  Psychiatric: Her mood appears anxious.  Fidgety, has a hard time sitting still  Nursing note and vitals reviewed.      ED Treatments / Results  Labs (all labs ordered are listed, but only abnormal results are displayed) Labs Reviewed  COMPREHENSIVE METABOLIC PANEL - Abnormal; Notable for the following:       Result Value   CO2 21 (*)    Glucose, Bld 149 (*)    AST 59 (*)    All other components within normal limits  CBC WITH DIFFERENTIAL/PLATELET  URINALYSIS, ROUTINE W REFLEX MICROSCOPIC  I-STAT CG4 LACTIC ACID, ED  I-STAT BETA HCG BLOOD, ED ([facility], [facility], [facility] ONLY)    EKG  EKG Interpretation None       Radiology Dg Hand Complete Left  Result Date: 07/17/2017 CLINICAL DATA:  Erythematous focal swelling along the left hand. EXAM: LEFT HAND - COMPLETE 3+ VIEW COMPARISON:  Report from prior exam from 04/03/2014 FINDINGS: Sclerotic lesion in the left distal radius is likely a bone island and was described on the prior exam. No foreign body or significant bony abnormality observed. Notable soft tissue swelling along the dorsum of the hand,  centered about at the level of the carpometacarpal articulation and tracking over the metacarpals. IMPRESSION: 1. Prominent dorsal soft tissue swelling in the hand without soft tissue gas or visible foreign body. No underlying bony abnormality seen. Electronically Signed   By: Gaylyn Rong M.D.   On: 07/17/2017 16:07    Procedures Procedures (including critical care time)  Medications Ordered in ED Medications - No data to display   Initial Impression / Assessment and Plan / ED Course  I have reviewed the triage vital signs and the nursing notes.  Pertinent labs & imaging results that were available during my  care of the patient were reviewed by me and considered in my medical decision making (see chart for details).  Clinical Course as of Jul 18 52  Wynelle LinkSun Jul 17, 2017  1519 D/w Dr. Carola FrostHandy, who will come eval patient. Asks for antibiotics to be held at this time. She does not appear septic, I think it's reasonable to wait. HR 120 in triage, but in 80s,90s on my exam  [SG]  1607 D/w Montez MoritaKeith Paul, plan is likely incise/drain and d/c. Ok with IV toradol for her pain  [SG]    Clinical Course User Index [SG] Pricilla LovelessGoldston, Dail Lerew, MD    Ortho will take to OR for I&D washout. Asks for hold on antibiotics. Does not appear septic.   Final Clinical Impressions(s) / ED Diagnoses   Final diagnoses:  Hand abscess    New Prescriptions New Prescriptions   No medications on file     Pricilla LovelessGoldston, Chinenye Katzenberger, MD 07/18/17 [phone]

## 2017-07-17 NOTE — Brief Op Note (Signed)
07/17/2017  6:36 PM  PATIENT:  [name]  31 y.o. female  PRE-OPERATIVE DIAGNOSIS:  LEFT HAND ABSCESS  POST-OPERATIVE DIAGNOSIS:  LEFT HAND ABSCESS  PROCEDURE:  Procedure(s): IRRIGATION AND DEBRIDEMENT HAND (Left) ABSCESS INCLUDING EXCISION OF TENOSYNOVIUM  SURGEON:  Surgeon(s) and Role:    Myrene Galas* Mahsa Hanser, MD - Primary  ASSISTANTS: none   ANESTHESIA:   general  EBL:  Total I/O In: 500 [I.V.:500] Out: -   BLOOD ADMINISTERED:none  DRAINS: none   LOCAL MEDICATIONS USED:  NONE  SPECIMEN:  Source of Specimen:  deep abscess  DISPOSITION OF SPECIMEN:  micro  COUNTS:  YES  TOURNIQUET:   Total Tourniquet Time Documented: Upper Arm (Left) - 12 minutes Total: Upper Arm (Left) - 12 minutes   DICTATION: .Other Dictation: Dictation Number [id number]  PLAN OF CARE: Admit to inpatient   PATIENT DISPOSITION:  PACU - hemodynamically stable.   Delay start of Pharmacological VTE agent (>24hrs) due to surgical blood loss or risk of bleeding: no

## 2017-07-17 NOTE — H&P (Addendum)
Orthopaedic Trauma Service H&P/Consult     Patient ID: [name] MRN: [id number] DOB/AGE: 1986-03-06 31 y.o.  Requesting Physician: Sherwood Gambler, MD Chief Complaint: Left hand abscess HPI: [name] is an 31 y.o. female. 3 day h/o increasing pain and erythema. Denies other injury. H/o endocarditis and heroin right antecubital abscess. Takes '100mg'$  methadone daily. No previous interventions, no alleviating factors. H/o IVDA and endocarditis.   Past Medical History:  Diagnosis Date  . Cocaine abuse 01/06/2015  . Heart murmur   . Hepatitis C, chronic ([place])   . Hypertension   . Seizures ([place])     Past Surgical History:  Procedure Laterality Date  . I&D EXTREMITY Right 01/07/2015   Procedure: IRRIGATION AND DEBRIDEMENT EXTREMITY;  Surgeon: Roseanne Kaufman, MD;  Location: [facility];  Service: Orthopedics;  Laterality: Right;  . NO PAST SURGERIES      Family History  Problem Relation Age of Onset  . Cancer Mother   . Diabetes Mother   . Hypertension Father    Social History:  reports that she has been smoking Cigarettes.  She has been smoking about 0.50 packs per day. She does not have any smokeless tobacco history on file. She reports that she uses drugs, including Cocaine, Marijuana, IV, Heroin, and Methamphetamines. She reports that she does not drink alcohol.  Allergies:  Allergies  Allergen Reactions  . Amoxicillin Other (See Comments)    diarrhea  . Penicillins Cross Reactors Hives, Nausea And Vomiting and Swelling    Patient experienced NO swelling of airways or SOB.  Has tolerated cephalosporins multiple times in the past Has patient had a PCN reaction causing immediate rash, facial/tongue/throat swelling, SOB or lightheadedness with hypotension: no Has patient had a PCN reaction causing severe rash involving mucus membranes or skin necrosis: no Has patient had a PCN reaction that required hospitalization: no Has patient had a PCN reaction occurring within the  last 10 years: no If all of the above answers are     Medications Prior to Admission  Medication Sig Dispense Refill  . diazepam (VALIUM) 2 MG tablet Take 1 mg by mouth every 6 (six) hours as needed for anxiety.    Marland Kitchen ibuprofen (ADVIL,MOTRIN) 200 MG tablet Take 200 mg by mouth every 6 (six) hours as needed for moderate pain.    Marland Kitchen lamoTRIgine (LAMICTAL) 150 MG tablet Take 150 mg by mouth daily.    . methadone (DOLOPHINE) 10 MG/ML solution Take 100 mg by mouth daily.    Marland Kitchen OLANZapine (ZYPREXA) 20 MG tablet Take 20 mg by mouth at bedtime.    Marland Kitchen QUEtiapine (SEROQUEL) 300 MG tablet Take 300 mg by mouth at bedtime.    . Prilosec '20mg'$  Take '20mg'$  BID    .      Marland Kitchen      .        Results for orders placed or performed during the hospital encounter of 07/17/17 (from the past 48 hour(s))  Comprehensive metabolic panel     Status: Abnormal   Collection Time: 07/17/17  1:00 PM  Result Value Ref Range   Sodium 138 135 - 145 mmol/L   Potassium 3.5 3.5 - 5.1 mmol/L   Chloride 104 101 - 111 mmol/L   CO2 21 (L) 22 - 32 mmol/L   Glucose, Bld 149 (H) 65 - 99 mg/dL   BUN 6 6 - 20 mg/dL   Creatinine, Ser 0.70 0.44 - 1.00 mg/dL   Calcium 8.9 8.9 - 10.3 mg/dL  Total Protein 7.1 6.5 - 8.1 g/dL   Albumin 3.8 3.5 - 5.0 g/dL   AST 59 (H) 15 - 41 U/L   ALT 44 14 - 54 U/L   Alkaline Phosphatase 56 38 - 126 U/L   Total Bilirubin 0.8 0.3 - 1.2 mg/dL   GFR calc non Af Amer >60 >60 mL/min   GFR calc Af Amer >60 >60 mL/min    Comment: (NOTE) The eGFR has been calculated using the CKD EPI equation. This calculation has not been validated in all clinical situations. eGFR's persistently <60 mL/min signify possible Chronic Kidney Disease.    Anion gap 13 5 - 15  CBC with Differential     Status: None   Collection Time: 07/17/17  1:00 PM  Result Value Ref Range   WBC 9.8 4.0 - 10.5 K/uL   RBC 4.50 3.87 - 5.11 MIL/uL   Hemoglobin 13.3 12.0 - 15.0 g/dL   HCT 39.1 36.0 - 46.0 %   MCV 86.9 78.0 - 100.0 fL   MCH  29.6 26.0 - 34.0 pg   MCHC 34.0 30.0 - 36.0 g/dL   RDW 12.8 11.5 - 15.5 %   Platelets 309 150 - 400 K/uL   Neutrophils Relative % 77 %   Neutro Abs 7.4 1.7 - 7.7 K/uL   Lymphocytes Relative 12 %   Lymphs Abs 1.2 0.7 - 4.0 K/uL   Monocytes Relative 10 %   Monocytes Absolute 1.0 0.1 - 1.0 K/uL   Eosinophils Relative 1 %   Eosinophils Absolute 0.1 0.0 - 0.7 K/uL   Basophils Relative 0 %   Basophils Absolute 0.0 0.0 - 0.1 K/uL  I-Stat beta hCG blood, ED     Status: None   Collection Time: 07/17/17  1:18 PM  Result Value Ref Range   I-stat hCG, quantitative <5.0 <5 mIU/mL   Comment 3            Comment:   GEST. AGE      CONC.  (mIU/mL)   <=1 WEEK        5 - 50     2 WEEKS       50 - 500     3 WEEKS       100 - 10,000     4 WEEKS     1,000 - 30,000        FEMALE AND NON-PREGNANT FEMALE:     LESS THAN 5 mIU/mL   I-Stat CG4 Lactic Acid, ED     Status: None   Collection Time: 07/17/17  1:20 PM  Result Value Ref Range   Lactic Acid, Venous 1.28 0.5 - 1.9 mmol/L  Urinalysis, Routine w reflex microscopic     Status: Abnormal   Collection Time: 07/17/17  4:21 PM  Result Value Ref Range   Color, Urine ORANGE (A) YELLOW    Comment: BIOCHEMICALS MAY BE AFFECTED BY COLOR   APPearance TURBID (A) CLEAR   Specific Gravity, Urine 1.033 (H) 1.005 - 1.030   pH 5.0 5.0 - 8.0   Glucose, UA NEGATIVE NEGATIVE mg/dL   Hgb urine dipstick LARGE (A) NEGATIVE   Bilirubin Urine SMALL (A) NEGATIVE   Ketones, ur 5 (A) NEGATIVE mg/dL   Protein, ur 100 (A) NEGATIVE mg/dL   Nitrite NEGATIVE NEGATIVE   Leukocytes, UA MODERATE (A) NEGATIVE  Urine rapid drug screen (hosp performed)     Status: Abnormal   Collection Time: 07/17/17  4:21 PM  Result Value Ref Range  Opiates POSITIVE (A) NONE DETECTED   Cocaine POSITIVE (A) NONE DETECTED   Benzodiazepines POSITIVE (A) NONE DETECTED   Amphetamines NONE DETECTED NONE DETECTED   Tetrahydrocannabinol POSITIVE (A) NONE DETECTED   Barbiturates NONE DETECTED NONE  DETECTED    Comment:        DRUG SCREEN FOR MEDICAL PURPOSES ONLY.  IF CONFIRMATION IS NEEDED FOR ANY PURPOSE, NOTIFY LAB WITHIN 5 DAYS.        LOWEST DETECTABLE LIMITS FOR URINE DRUG SCREEN Drug Class       Cutoff (ng/mL) Amphetamine      1000 Barbiturate      200 Benzodiazepine   119 Tricyclics       417 Opiates          300 Cocaine          300 THC              50   Urinalysis, Microscopic (reflex)     Status: Abnormal   Collection Time: 07/17/17  4:21 PM  Result Value Ref Range   RBC / HPF TOO NUMEROUS TO COUNT 0 - 5 RBC/hpf   WBC, UA TOO NUMEROUS TO COUNT 0 - 5 WBC/hpf   Bacteria, UA NONE SEEN NONE SEEN   Squamous Epithelial / LPF 6-30 (A) NONE SEEN   Dg Hand Complete Left  Result Date: 07/17/2017 CLINICAL DATA:  Erythematous focal swelling along the left hand. EXAM: LEFT HAND - COMPLETE 3+ VIEW COMPARISON:  Report from prior exam from 04/03/2014 FINDINGS: Sclerotic lesion in the left distal radius is likely a bone island and was described on the prior exam. No foreign body or significant bony abnormality observed. Notable soft tissue swelling along the dorsum of the hand, centered about at the level of the carpometacarpal articulation and tracking over the metacarpals. IMPRESSION: 1. Prominent dorsal soft tissue swelling in the hand without soft tissue gas or visible foreign body. No underlying bony abnormality seen. Electronically Signed   By: Van Clines M.D.   On: 07/17/2017 16:07    ROS Recent fever, but denies bleeding abnormalities, urologic dysfunction, GI problems, or weight gain.  Blood pressure 115/73, pulse (!) 120, temperature 98.1 F (36.7 C), temperature source Oral, resp. rate 20, SpO2 97 %. Physical Exam Agitated psychomotor Tachycardia RR No audible wheezing Abdomen soft LUE     Large, prominent abscess with centrally blanched skin = 2 cm nidus, small ulnar wrist satellite lesion; primary abscess appears to be in line with hand vein  Severe  erythema and swelling             Sens  Ax/R/M/U intact             Mot   Ax/ R/ PIN/ M/ AIN/ U intact             Brisk CR  Assessment/Plan  Left hand raging abscess for I&D of left hand, admission and IV Abx  I discussed with the patient the risks and benefits of surgery, including the possibility of persistent infection, nerve injury, vessel injury, wound breakdown, DVT/ PE, loss of motion, and need for further surgery among others.  She acknowledged these risks and wished to proceed.   Altamese [place], MD [facility] [phone] (p)  07/17/2017, 5:26 PM

## 2017-07-17 NOTE — Anesthesia Postprocedure Evaluation (Signed)
Anesthesia Post Note  Patient: [name]  Procedure(s) Performed: Procedure(s) (LRB): IRRIGATION AND DEBRIDEMENT HAND (Left)     Patient location during evaluation: PACU Anesthesia Type: General Level of consciousness: awake Pain management: pain level controlled Vital Signs Assessment: post-procedure vital signs reviewed and stable Respiratory status: spontaneous breathing Cardiovascular status: stable Postop Assessment: no signs of nausea or vomiting Anesthetic complications: no    Last Vitals:  Vitals:   07/17/17 1900 07/17/17 1915  BP: (!) 121/91 124/90  Pulse: 73 71  Resp: 16 17  Temp:    SpO2: 99% 100%    Last Pain:  Vitals:   07/17/17 1915  TempSrc:   PainSc: Asleep                 Isaia Hassell

## 2017-07-17 NOTE — Transfer of Care (Signed)
Immediate Anesthesia Transfer of Care Note  Patient: Jamie FrederickKrystal D Swindell  Procedure(s) Performed: Procedure(s): IRRIGATION AND DEBRIDEMENT HAND (Left)  Patient Location: PACU  Anesthesia Type:General  Level of Consciousness: awake and alert   Airway & Oxygen Therapy: Patient Spontanous Breathing and Patient connected to nasal cannula oxygen  Post-op Assessment: Report given to RN and Post -op Vital signs reviewed and stable  Post vital signs: Reviewed and stable  Last Vitals:  Vitals:   07/17/17 1628 07/17/17 1845  BP: 115/73   Pulse:    Resp:    Temp:  (P) 36.6 C  SpO2:      Last Pain:  Vitals:   07/17/17 1845  TempSrc:   PainSc: (P) Asleep         Complications: No apparent anesthesia complications

## 2017-07-17 NOTE — Anesthesia Preprocedure Evaluation (Signed)
Anesthesia Evaluation  Patient identified by MRN, date of birth, ID band Patient awake    Reviewed: Allergy & Precautions, NPO status , Patient's Chart, lab work & pertinent test results  Airway Mallampati: II  TM Distance: >3 FB     Dental   Pulmonary pneumonia, Current Smoker,    breath sounds clear to auscultation       Cardiovascular hypertension, + Valvular Problems/Murmurs  Rhythm:Regular Rate:Normal     Neuro/Psych Seizures -,     GI/Hepatic negative GI ROS, (+) Hepatitis -  Endo/Other  negative endocrine ROS  Renal/GU negative Renal ROS     Musculoskeletal   Abdominal   Peds  Hematology   Anesthesia Other Findings   Reproductive/Obstetrics                             Anesthesia Physical Anesthesia Plan  ASA: III  Anesthesia Plan: General   Post-op Pain Management:    Induction: Intravenous, Rapid sequence and Cricoid pressure planned  PONV Risk Score and Plan: 2 and Ondansetron, Dexamethasone, Propofol infusion, Midazolam and Treatment may vary due to age or medical condition  Airway Management Planned: Oral ETT  Additional Equipment:   Intra-op Plan:   Post-operative Plan: Extubation in OR  Informed Consent: I have reviewed the patients History and Physical, chart, labs and discussed the procedure including the risks, benefits and alternatives for the proposed anesthesia with the patient or authorized representative who has indicated his/her understanding and acceptance.   Dental advisory given  Plan Discussed with: CRNA, Anesthesiologist and Surgeon  Anesthesia Plan Comments:         Anesthesia Quick Evaluation

## 2017-07-17 NOTE — ED Triage Notes (Signed)
Patient reports that she was bitten by insect 3 days ago with abscess and redness/swelling to left hand. Appears anxious on arrival. denies any recent drug use. Patient restless and also reports that she takes methadone

## 2017-07-18 ENCOUNTER — Encounter (HOSPITAL_COMMUNITY): Payer: Self-pay | Admitting: Orthopedic Surgery

## 2017-07-18 LAB — CBC
HEMATOCRIT: 38 % (ref 36.0–46.0)
HEMOGLOBIN: 12.6 g/dL (ref 12.0–15.0)
MCH: 29 pg (ref 26.0–34.0)
MCHC: 33.2 g/dL (ref 30.0–36.0)
MCV: 87.6 fL (ref 78.0–100.0)
Platelets: 277 10*3/uL (ref 150–400)
RBC: 4.34 MIL/uL (ref 3.87–5.11)
RDW: 13 % (ref 11.5–15.5)
WBC: 3.9 10*3/uL — ABNORMAL LOW (ref 4.0–10.5)

## 2017-07-18 NOTE — Progress Notes (Signed)
Orthopaedic Trauma Service (OTS)  1 Day Post-Op Procedure(s) (LRB): IRRIGATION AND DEBRIDEMENT HAND (Left)  Subjective: Patient reports pain as moderate.    Objective: Current Vitals Blood pressure 106/64, pulse 73, temperature 97.6 F (36.4 C), temperature source Oral, resp. rate 16, SpO2 100 %. Vital signs in last 24 hours: Temp:  [97.3 F (36.3 C)-97.9 F (36.6 C)] 97.6 F (36.4 C) (09/03 0725) Pulse Rate:  [71-85] 73 (09/03 0725) Resp:  [13-20] 16 (09/03 0725) BP: (106-128)/(64-91) 106/64 (09/03 0725) SpO2:  [96 %-100 %] 100 % (09/03 0725)  Intake/Output from previous day: 09/02 0701 - 09/03 0700 In: 500 [I.V.:500] Out: 25 [Blood:25]  LABS  Recent Labs  07/17/17 1300 07/18/17 0309  HGB 13.3 12.6    Recent Labs  07/17/17 1300 07/18/17 0309  WBC 9.8 3.9*  RBC 4.50 4.34  HCT 39.1 38.0  PLT 309 277    Recent Labs  07/17/17 1300  NA 138  K 3.5  CL 104  CO2 21*  BUN 6  CREATININE 0.70  GLUCOSE 149*  CALCIUM 8.9   No results for input(s): LABPT, INR in the last 72 hours.   Physical Exam LUE  Splint in place  Sens  Ax/R/M/U intact  Mot   Ax/ R/ PIN/ M/ AIN/ U intact  Brisk CR  Assessment/Plan: 1 Day Post-Op Procedure(s) (LRB): IRRIGATION AND DEBRIDEMENT HAND (Left)  Gram positive cocci abscess UTI 1. OT  With hydrotherapy 2. D/c to home tomorrow on oral abx  Myrene GalasMichael Nicie Milan, MD [facility] [phone] (p)

## 2017-07-18 NOTE — Evaluation (Signed)
Occupational Therapy Evaluation Patient Details Name: [name] MRN: [id number] DOB: 1986-07-14 Today's Date: 07/18/2017    History of Present Illness 31 year old female with a history of cocaine abuse, IV drug abuse, hepatitis C, prior endocarditis, presents with left hand infection. She states she was bitten by bugs 3 days ago and it has progressively swollen. She states it's gotten to the point where now the pain is severe. Underwent irrigation and debridement of left hand abscess including excision of tenosynovium on 07/17/17.   Clinical Impression   PTA, pt was living with her children and friends and was independent. Currently, pt demonstrating decreased balance, safety awareness, problem solving, and attention. Pt requiring Max A for LB ADLs and functional mobility due to LOB in standing and impulsivity. Pt would benefit from further acute OT to optimize safety and independence with ADLs and functional mobility. Recommend dc home once medically stable per physician.      Follow Up Recommendations  No OT follow up;Supervision/Assistance - 24 hour;Other (comment) (Pending pt progress)    Equipment Recommendations  Other (comment) (Pending pt progress)    Recommendations for Other Services PT consult     Precautions / Restrictions Precautions Precautions: Fall Restrictions Other Position/Activity Restrictions: No formal WB ordered for LUE.       Mobility Bed Mobility Overal bed mobility: Needs Assistance Bed Mobility: Supine to Sit     Supine to sit: Min guard     General bed mobility comments: Min Guard for safety due to impulsivity  Transfers Overall transfer level: Needs assistance Equipment used: None Transfers: Sit to/from Stand Sit to Stand: Mod assist;Max assist         General transfer comment: Mod-Max A to maintain balance    Balance Overall balance assessment: Needs assistance Sitting-balance support: No upper extremity supported;Feet  supported Sitting balance-Leahy Scale: Fair Sitting balance - Comments: Able to maintain sitting balance. however, very impulsive.   Standing balance support: No upper extremity supported;During functional activity Standing balance-Leahy Scale: Poor Standing balance comment: requiring Max A to correct LOB                           ADL either performed or assessed with clinical judgement   ADL Overall ADL's : Needs assistance/impaired Eating/Feeding: Minimal assistance;Sitting Eating/Feeding Details (indicate cue type and reason): Pt able to use L hand to stabilize apple sauce container while self feeding. Min A to open lids and set up food. Grooming: Therapist, nutritional;Moderate assistance;Standing Grooming Details (indicate cue type and reason): Pt requiring Mod A while standing at sink to maintain balance.  Upper Body Bathing: Moderate assistance;Sitting   Lower Body Bathing: Maximal assistance;Sit to/from stand   Upper Body Dressing : Moderate assistance;Sitting   Lower Body Dressing: Maximal assistance;Sit to/from stand   Toilet Transfer: Maximal Holiday representative;Ambulation Toilet Transfer Details (indicate cue type and reason): Pt fluctuating to need Min A and then Max A to maintain balance. Pt very unsteady in standing and impulsive Toileting- Clothing Manipulation and Hygiene: Set up;Supervision/safety;Sitting/lateral lean Toileting - Clothing Manipulation Details (indicate cue type and reason): Close supervision for safety due to impulsivity     Functional mobility during ADLs: Maximal assistance (Pt requiring Max A to maintain balance due to LOB) General ADL Comments: Pt demonstrating decreased functional performance throughout session. Pt requiring Max VCs to order from menu. Pt requiring Min A-Max A for functional mobility due to significant LOB and needing Max A to correct.  Vision Baseline Vision/History: Wears glasses Wears Glasses: At all  times Patient Visual Report: No change from baseline;Other (comment) (Does not have glasses with her)       Perception     Praxis      Pertinent Vitals/Pain Pain Assessment: Faces Faces Pain Scale: Hurts whole lot Pain Location: L hand Pain Descriptors / Indicators: Constant;Discomfort;Grimacing;Guarding Pain Intervention(s): Monitored during session;Limited activity within patient's tolerance;Repositioned;Patient requesting pain meds-RN notified     Hand Dominance Right   Extremity/Trunk Assessment Upper Extremity Assessment Upper Extremity Assessment: LUE deficits/detail LUE Deficits / Details: Irrigation and debridment of left hand. Pt with limited digit ROM due to pain. LUE: Unable to fully assess due to pain LUE Coordination: decreased fine motor   Lower Extremity Assessment Lower Extremity Assessment: Defer to PT evaluation   Cervical / Trunk Assessment Cervical / Trunk Assessment: Normal   Communication Communication Communication: Other (comment) (Slurred speech)   Cognition Arousal/Alertness: Lethargic;Suspect due to medications Behavior During Therapy: Restless;Impulsive Overall Cognitive Status: No family/caregiver present to determine baseline cognitive functioning                                 General Comments: Pt impulsive and has decreased attention, problem solving, and awareness. Pt requiring cues throughout session for safety and to sequence tasks such as ordering lunch and using restroom.    General Comments  L hand ace wrap in place    Exercises Exercises: Hand exercises Hand Exercises Digit Composite Flexion: AROM;Left;5 reps;Seated Composite Extension: AROM;Left;5 reps;Seated (Pt only able to handle 5 reps due to pain)   Shoulder Instructions      Home Living Family/patient expects to be discharged to:: Private residence Living Arrangements: Children;Non-relatives/Friends (Two children) Available Help at Discharge:  Friend(s);Family;Available PRN/intermittently Type of Home: Mobile home Home Access: Stairs to enter Entrance Stairs-Number of Steps: 2   Home Layout: One level     Bathroom Shower/Tub: IT trainer: Standard     Home Equipment: None   Additional Comments: Pt reporting that she lives with her children and then a friend and the friend's boyfrien      Prior Functioning/Environment Level of Independence: Independent        Comments: ADLs, IADLs, driving. Per pt, "I drive my kids to school each day."        OT Problem List: Decreased strength;Decreased activity tolerance;Impaired balance (sitting and/or standing);Decreased safety awareness;Decreased knowledge of use of DME or AE;Decreased knowledge of precautions;Pain;Impaired UE functional use      OT Treatment/Interventions: Self-care/ADL training;Therapeutic exercise;Energy conservation;DME and/or AE instruction;Therapeutic activities;Patient/family education    OT Goals(Current goals can be found in the care plan section) Acute Rehab OT Goals Patient Stated Goal: Stop pain OT Goal Formulation: With patient Time For Goal Achievement: 08/01/17 Potential to Achieve Goals: Good ADL Goals Pt Will Perform Grooming: with supervision;with set-up;standing Pt Will Perform Upper Body Dressing: with modified independence;sitting Pt Will Perform Lower Body Dressing: sit to/from stand;with set-up;with supervision Pt Will Transfer to Toilet: with set-up;with supervision;regular height toilet;ambulating Pt Will Perform Toileting - Clothing Manipulation and hygiene: with modified independence;sit to/from stand Pt Will Perform Tub/Shower Transfer: Tub transfer;ambulating;with min guard assist (with or without 3N1)  OT Frequency: Min 3X/week   Barriers to D/C:            Co-evaluation              AM-PAC PT "6 Clicks" Daily  Activity     Outcome Measure Help from another person eating meals?: A  Little Help from another person taking care of personal grooming?: A Little Help from another person toileting, which includes using toliet, bedpan, or urinal?: A Lot Help from another person bathing (including washing, rinsing, drying)?: A Lot Help from another person to put on and taking off regular upper body clothing?: A Little Help from another person to put on and taking off regular lower body clothing?: A Lot 6 Click Score: 15   End of Session Equipment Utilized During Treatment: Gait belt Nurse Communication: Mobility status;Patient requests pain meds  Activity Tolerance: Patient tolerated treatment well;Patient limited by pain Patient left: in chair;with call bell/phone within reach;with chair alarm set  OT Visit Diagnosis: Unsteadiness on feet (R26.81);Other abnormalities of gait and mobility (R26.89);Pain Pain - Right/Left: Left Pain - part of body: Hand                Time: [id number] OT Time Calculation (min): 25 min Charges:  OT General Charges $OT Visit: 1 Visit OT Evaluation $OT Eval Moderate Complexity: 1 Mod OT Treatments $Self Care/Home Management : 8-22 mins G-Codes:     Sarahann Horrell MSOT, OTR/L Acute Rehab Pager: [phone] Office: [phone]  Theodoro GristCharis M Anureet Bruington 07/18/2017, 1:09 PM

## 2017-07-18 NOTE — Progress Notes (Signed)
Physical Therapy Wound Treatment Patient Details  Name: [name] MRN: [id number] Date of Birth: Jun 27, 1986  Today's Date: 07/18/2017 Time: 0820-0904 Time Calculation (min): 44 min  Subjective  Subjective: "It hurts, It hurts, I need my breakthrough medicine." Patient and Family Stated Goals: Decrease pain Date of Onset: 07/14/17 Prior Treatments: I&D 9/2  Pain Score:  Premedicated. Appeared extremely painful at times but tolerated pulsed-lavage fairly well considering level of pain at start of session. Reported 7/10 at rest.   Wound Assessment  Wound / Incision (Open or Dehisced) 07/18/17 Incision - Open Hand Left (Active)  Dressing Type Compression wrap;Gauze (Comment);Moist to dry 07/18/2017  8:00 AM  Dressing Changed Changed 07/18/2017  8:00 AM  Dressing Status Clean;Dry;Intact 07/18/2017  8:00 AM  Dressing Change Frequency Daily 07/18/2017  8:00 AM  Site / Wound Assessment Red 07/18/2017  8:00 AM  % Wound base Red or Granulating 100% 07/18/2017  8:00 AM  % Wound base Yellow/Fibrinous Exudate 0% 07/18/2017  8:00 AM  % Wound base Black/Eschar 0% 07/18/2017  8:00 AM  % Wound base Other/Granulation Tissue (Comment) 0% 07/18/2017  8:00 AM  Peri-wound Assessment Pink;Maceration;Purple;Erythema (blanchable);Intact 07/18/2017  8:00 AM  Wound Length (cm) 2.9 cm 07/18/2017  8:00 AM  Wound Width (cm) 1.7 cm 07/18/2017  8:00 AM  Wound Depth (cm) 1.1 cm 07/18/2017  8:00 AM  Undermining (cm) Some undermining noted at 4:00-5:00. Unable to measure 2 pain.  07/18/2017  8:00 AM  Margins Unattached edges (unapproximated) 07/18/2017  8:00 AM  Closure None 07/18/2017  8:00 AM  Drainage Amount Moderate 07/18/2017  8:00 AM  Drainage Description Serosanguineous 07/18/2017  8:00 AM  Non-staged Wound Description Not applicable 02/18/6255  3:89 AM  Treatment Hydrotherapy (Pulse lavage);Packing (Plain strip) 07/18/2017  8:00 AM     Hydrotherapy Pulsed lavage therapy - wound location: L hand Pulsed Lavage with Suction (psi): 8  psi Pulsed Lavage with Suction - Normal Saline Used: 1000 mL Pulsed Lavage Tip: Tip with splash shield   Wound Assessment and Plan  Wound Therapy - Assess/Plan/Recommendations Wound Therapy - Clinical Statement: Pt presents to hydrotherapy s/p I&D. Pt will benefit from continued pulsed-levage to decrease bioburden and maximize wound bed healing.  Wound Therapy - Functional Problem List: Acute pain Factors Delaying/Impairing Wound Healing: Substance abuse;Infection - systemic/local;Tobacco use Hydrotherapy Plan: Dressing change;Patient/family education;Debridement;Pulsatile lavage with suction Wound Therapy - Frequency: 6X / week Wound Therapy - Follow Up Recommendations: Home health RN Wound Plan: See above  Wound Therapy Goals- Improve the function of patient's integumentary system by progressing the wound(s) through the phases of wound healing (inflammation - proliferation - remodeling) by: Decrease Length/Width/Depth by (cm): 1 cm Decrease Length/Width/Depth - Progress: Goal set today Improve Drainage Characteristics: Min;Serous Improve Drainage Characteristics - Progress: Goal set today Goals/treatment plan/discharge plan were made with and agreed upon by patient/family: Yes Time For Goal Achievement: 7 days Wound Therapy - Potential for Goals: Good  Goals will be updated until maximal potential achieved or discharge criteria met.  Discharge criteria: when goals achieved, discharge from hospital, MD decision/surgical intervention, no progress towards goals, refusal/missing three consecutive treatments without notification or medical reason.  GP     [name] 07/18/2017, 9:23 AM   Rolinda Roan, PT, DPT Acute Rehabilitation Services Pager: [phone]

## 2017-07-18 NOTE — Op Note (Addendum)
NAME:  [name], [name]                    ACCOUNT NO.:  MEDICAL RECORD NO.:  [id number]  LOCATION:                                 FACILITY:  PHYSICIAN:  Doralee AlbinoMichael H. Carola FrostHandy, M.D.      DATE OF BIRTH:  DATE OF PROCEDURE:  07/17/2017 DATE OF DISCHARGE:                              OPERATIVE REPORT   PREOPERATIVE DIAGNOSIS:  Left hand abscess.  POSTOPERATIVE DIAGNOSIS:  Left hand abscess.  PROCEDURE:   1. Incision and drainage of left hand abscess 2. Excision of tenosynovium extensor digitorum longus.  SURGEON:  Doralee AlbinoMichael H. Carola FrostHandy, M.D.  ASSISTANT:  None.  ANESTHESIA:  General.  COMPLICATIONS:  None.  SPECIMENS:  Two anaerobic, aerobic sent to Micro.  TOTAL TOURNIQUET TIME:  12 minutes.  DISPOSITION:  To PACU.  CONDITION:  Stable.  BRIEF SUMMARY AND INDICATION FOR PROCEDURE:  [name] is a 31 year old female with a history of IV drug abuse including complications related to same with endocarditis and right arm abscess involving the antecubital fossa.  She is currently taking methadone 100 mg daily.  She states that she was bitten on the dorsum of her hand about 3 days ago and she has had abrupt increase in pain, erythema and swelling over the dorsum of her hand.  She denied other injuries.  She denied recent infection elsewhere.  I discussed with her the risks and benefits of surgical debridement including the potential for persistent infection, need for further surgery, loss of motion and others, she strongly wished to proceed.  BRIEF SUMMARY OF PROCEDURE:  The patient was taken to the operating room where general anesthesia was induced.  Her left upper extremity was prepped and draped in usual sterile fashion.  A tourniquet was placed about the arm, but not initially inflated.  After time-out, a 4-cm incision was made vertically over the fourth metacarpal.  In the area of the abscess, there was immediate egress of purulence under pressure. Has sent for anaerobic  and aerobic cultures.  Antibiotics were given at that point.  Three liters of saline were then flushed through the arm after first exploring the cavity with a hemostat using [employer] to gain full inspection thereof.  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  I did not encounter any purulence with digital palpation of the forearm, extensors from proximal to distal.  The arm was elevated, tourniquet inflated, and then a curette used to completely scrape the cavities.  I did not scrape directly on the tendons themselves.  The irrigation then continued at that point, getting excellent evacuation of all this material.  I also confirmed that I had adequately debrided a small satellite lesion that had popped up on the ulnar aspect of the wrist. Iodoform packing was then placed using 0.5-inch strip.  Sterile gently compressive dressing and volar splint were applied.  The patient was taken to PACU in stable condition.  PROGNOSIS:  [name] will begin hydrotherapy.  She will continue on IV  antibiotics, which will be tapered according to the type of infection. She will need to stay here until the infection is adequately control to switch her to oral medications as she is not an IV candidate for home use.  She remains at elevated risk for persistent or recurrent complications given her social patterns.  These appeared to be substantiated by a toxicology screen, which was positive for cocaine, cannabis, and benzodiazepines, one of which she does take as regular medication.     Doralee Albino. Carola Frost, M.D.     MHH/MEDQ  D:  07/17/2017  T:  07/17/2017  Job:  [id number]

## 2017-07-18 NOTE — Care Management Note (Signed)
Case Management Note  Patient Details  Name: Ruffin FrederickKrystal D Sattler MRN: [id number] Date of Birth: [birth year]  Subjective/Objective:                    Action/Plan:  Plan to discharge to home on 9/4 on PO ABX. Patient has non medicaid qualify DX for HHPT.  Will need MATCH letter to fill PO ABX prescription. Expected Discharge Date:                  Expected Discharge Plan:  Home/Self Care  In-House Referral:     Discharge planning Services  CM [facility], [facility], [facility]  Post Acute Care Choice:  Home Health Choice offered to:     DME Arranged:    DME Agency:     HH Arranged:    HH Agency:     Status of Service:  In process, will continue to follow  If discussed at Long Length of Stay Meetings, dates discussed:    Additional Comments:  Kingsley PlanWile, Lorin Hauck Marie, RN 07/18/2017, 3:23 PM

## 2017-07-19 LAB — CBC
HCT: 34 % — ABNORMAL LOW (ref 36.0–46.0)
HEMOGLOBIN: 11 g/dL — AB (ref 12.0–15.0)
MCH: 29.1 pg (ref 26.0–34.0)
MCHC: 32.4 g/dL (ref 30.0–36.0)
MCV: 89.9 fL (ref 78.0–100.0)
PLATELETS: 267 10*3/uL (ref 150–400)
RBC: 3.78 MIL/uL — ABNORMAL LOW (ref 3.87–5.11)
RDW: 13.6 % (ref 11.5–15.5)
WBC: 5.8 10*3/uL (ref 4.0–10.5)

## 2017-07-19 LAB — URINE CULTURE

## 2017-07-19 MED ORDER — HYDROCODONE-ACETAMINOPHEN 5-325 MG PO TABS: 1.0000 | ORAL_TABLET | Freq: Three times a day (TID) | ORAL | 0 refills | Status: AC | PRN

## 2017-07-19 MED ORDER — KETOROLAC TROMETHAMINE 10 MG PO TABS: 10.0000 mg | ORAL_TABLET | Freq: Four times a day (QID) | ORAL | 0 refills | Status: AC | PRN

## 2017-07-19 MED ORDER — DOXYCYCLINE HYCLATE 100 MG PO TABS
100.0000 mg | ORAL_TABLET | Freq: Two times a day (BID) | ORAL | Status: DC
  Administered 2017-07-19: 100 mg via ORAL
  Filled 2017-07-19: qty 1

## 2017-07-19 MED ORDER — DOXYCYCLINE HYCLATE 100 MG PO TABS: 100.0000 mg | ORAL_TABLET | Freq: Two times a day (BID) | ORAL | 0 refills | Status: AC

## 2017-07-19 NOTE — Discharge Instructions (Signed)
Orthopaedic Trauma Service Discharge Instructions   General Discharge Instructions  WEIGHT BEARING STATUS: Weightbearing as tolerated through left hand and wrist  RANGE OF MOTION/ACTIVITY: as tolerated  Wound Care: daily packing changes to L hand wound. Use packing to fill wound. Cover with gauze and ace wrap. Change daily. Keep wound clean    Diet: as you were eating previously.  Can use over the counter stool softeners and bowel preparations, such as Miralax, to help with bowel movements.  Narcotics can be constipating.  Be sure to drink plenty of fluids  PAIN MEDICATION USE AND EXPECTATIONS  You have likely been given narcotic medications to help control your pain.  After a traumatic event that results in an fracture (broken bone) with or without surgery, it is ok to use narcotic pain medications to help control one's pain.  We understand that everyone responds to pain differently and each individual patient will be evaluated on a regular basis for the continued need for narcotic medications. Ideally, narcotic medication use should last no more than 6-8 weeks (coinciding with fracture healing).   As a patient it is your responsibility as well to monitor narcotic medication use and report the amount and frequency you use these medications when you come to your office visit.   We would also advise that if you are using narcotic medications, you should take a dose prior to therapy to maximize you participation.  IF YOU ARE ON NARCOTIC MEDICATIONS IT IS NOT PERMISSIBLE TO OPERATE A MOTOR VEHICLE (MOTORCYCLE/CAR/TRUCK/MOPED) OR HEAVY MACHINERY DO NOT MIX NARCOTICS WITH OTHER CNS (CENTRAL NERVOUS SYSTEM) DEPRESSANTS SUCH AS ALCOHOL   STOP SMOKING OR USING NICOTINE PRODUCTS!!!!  As discussed nicotine severely impairs your body's ability to heal surgical and traumatic wounds but also impairs bone healing.  Wounds and bone heal by forming microscopic blood vessels (angiogenesis) and nicotine is a  vasoconstrictor (essentially, shrinks blood vessels).  Therefore, if vasoconstriction occurs to these microscopic blood vessels they essentially disappear and are unable to deliver necessary nutrients to the healing tissue.  This is one modifiable factor that you can do to dramatically increase your chances of healing your injury.    (This means no smoking, no nicotine gum, patches, etc)  DO NOT USE NONSTEROIDAL ANTI-INFLAMMATORY DRUGS (NSAID'S)  Using products such as Advil (ibuprofen), Aleve (naproxen), Motrin (ibuprofen) for additional pain control during fracture healing can delay and/or prevent the healing response.  If you would like to take over the counter (OTC) medication, Tylenol (acetaminophen) is ok.  However, some narcotic medications that are given for pain control contain acetaminophen as well. Therefore, you should not exceed more than 4000 mg of tylenol in a day if you do not have liver disease.  Also note that there are may OTC medicines, such as cold medicines and allergy medicines that my contain tylenol as well.  If you have any questions about medications and/or interactions please ask your doctor/PA or your pharmacist.      ICE AND ELEVATE INJURED/OPERATIVE EXTREMITY  Using ice and elevating the injured extremity above your heart can help with swelling and pain control.  Icing in a pulsatile fashion, such as 20 minutes on and 20 minutes off, can be followed.    Do not place ice directly on skin. Make sure there is a barrier between to skin and the ice pack.    Using frozen items such as frozen peas works well as the conform nicely to the are that needs to be iced.  USE  AN ACE WRAP OR TED HOSE FOR SWELLING CONTROL  In addition to icing and elevation, Ace wraps or TED hose are used to help limit and resolve swelling.  It is recommended to use Ace wraps or TED hose until you are informed to stop.    When using Ace Wraps start the wrapping distally (farthest away from the body) and  wrap proximally (closer to the body)   Example: If you had surgery on your leg or thing and you do not have a splint on, start the ace wrap at the toes and work your way up to the thigh        If you had surgery on your upper extremity and do not have a splint on, start the ace wrap at your fingers and work your way up to the upper arm  IF YOU ARE IN A SPLINT OR CAST DO NOT REMOVE IT FOR ANY REASON   If your splint gets wet for any reason please contact the office immediately. You may shower in your splint or cast as long as you keep it dry.  This can be done by wrapping in a cast cover or garbage back (or similar)  Do Not stick any thing down your splint or cast such as pencils, money, or hangers to try and scratch yourself with.  If you feel itchy take benadryl as prescribed on the bottle for itching  IF YOU ARE IN A CAM BOOT (BLACK BOOT)  You may remove boot periodically. Perform daily dressing changes as noted below.  Wash the liner of the boot regularly and wear a sock when wearing the boot. It is recommended that you sleep in the boot until told otherwise  CALL THE OFFICE WITH ANY QUESTIONS OR CONCERNS: [phone]

## 2017-07-19 NOTE — Progress Notes (Signed)
Physical Therapy Wound Treatment Patient Details  Name: [name] MRN: [id number] Date of Birth: 10-14-1986  Today's Date: 07/19/2017 Time: 0840-0910 Time Calculation (min): 30 min  Subjective  Subjective: "It wasn't as bad today as I thought it would be." Patient and Family Stated Goals: Decrease pain Date of Onset: 07/14/17 Prior Treatments: I&D 9/2  Pain Score: Pt premedicated. Reports some pain but better than yesterday as stated above.   Wound Assessment  Wound / Incision (Open or Dehisced) 07/18/17 Incision - Open Hand Left (Active)  Dressing Type Compression wrap;Gauze (Comment);Moist to dry 07/19/2017  9:00 AM  Dressing Changed Changed 07/19/2017  9:00 AM  Dressing Status Clean;Dry;Intact 07/19/2017  9:00 AM  Dressing Change Frequency Daily 07/19/2017  9:00 AM  Site / Wound Assessment Red 07/19/2017  9:00 AM  % Wound base Red or Granulating 100% 07/19/2017  9:00 AM  % Wound base Yellow/Fibrinous Exudate 0% 07/19/2017  9:00 AM  % Wound base Black/Eschar 0% 07/19/2017  9:00 AM  % Wound base Other/Granulation Tissue (Comment) 0% 07/19/2017  9:00 AM  Peri-wound Assessment Pink;Maceration;Purple;Erythema (blanchable);Intact 07/19/2017  9:00 AM  Wound Length (cm) 2.9 cm 07/18/2017  8:00 AM  Wound Width (cm) 1.7 cm 07/18/2017  8:00 AM  Wound Depth (cm) 1.1 cm 07/18/2017  8:00 AM  Undermining (cm) Some undermining noted at 4:00-5:00. Unable to measure 2 pain.  07/18/2017  8:00 AM  Margins Unattached edges (unapproximated) 07/19/2017  9:00 AM  Closure None 07/19/2017  9:00 AM  Drainage Amount Moderate 07/19/2017  9:00 AM  Drainage Description Serosanguineous 07/19/2017  9:00 AM  Non-staged Wound Description Not applicable 03/22/2619  3:55 AM  Treatment Hydrotherapy (Pulse lavage) 07/19/2017  9:00 AM      Hydrotherapy Pulsed lavage therapy - wound location: L hand Pulsed Lavage with Suction (psi): 8 psi (12 towards the end) Pulsed Lavage with Suction - Normal Saline Used: 1000 mL Pulsed Lavage Tip: Tip with  splash shield   Wound Assessment and Plan  Wound Therapy - Assess/Plan/Recommendations Wound Therapy - Clinical Statement: Pt presents to hydrotherapy s/p I&D. Pt will benefit from continued pulsed-levage to decrease bioburden and maximize wound bed healing.  Wound Therapy - Functional Problem List: Acute pain Factors Delaying/Impairing Wound Healing: Substance abuse;Infection - systemic/local;Tobacco use Hydrotherapy Plan: Dressing change;Patient/family education;Debridement;Pulsatile lavage with suction Wound Therapy - Frequency: 6X / week Wound Therapy - Follow Up Recommendations: Home health RN Wound Plan: See above  Wound Therapy Goals- Improve the function of patient's integumentary system by progressing the wound(s) through the phases of wound healing (inflammation - proliferation - remodeling) by: Decrease Length/Width/Depth by (cm): 1 cm Decrease Length/Width/Depth - Progress: Progressing toward goal Improve Drainage Characteristics: Min;Serous Improve Drainage Characteristics - Progress: Progressing toward goal Goals/treatment plan/discharge plan were made with and agreed upon by patient/family: Yes Time For Goal Achievement: 7 days Wound Therapy - Potential for Goals: Good  Goals will be updated until maximal potential achieved or discharge criteria met.  Discharge criteria: when goals achieved, discharge from hospital, MD decision/surgical intervention, no progress towards goals, refusal/missing three consecutive treatments without notification or medical reason.  GP     [name] 07/19/2017, 9:20 AM   Rolinda Roan, PT, DPT Acute Rehabilitation Services Pager: [phone]

## 2017-07-19 NOTE — Care Management Note (Signed)
Case Management Note  Patient Details  Name: [name] MRN: [id number] Date of Birth: 1986-05-25  Subjective/Objective:  [age] yr old female admitted with abscess of her left hand.                    Action/Plan: Case manager provided patient with MATCH letter and explained process. [facility] Monday, August 10,2018 at 1:30pm with Robyne PeersAngela McClung,PA. Patient was advised to be there at 1:00pm.  Patient will need cab voucher for transportation to [facility] [address] [place], [place]. CM has spoken with Social worker.    Expected Discharge Date:   07/19/17               Expected Discharge Plan:  Home/Self Care  In-House Referral:     Discharge planning Services  CM Consult, [facility] Health Clinic, [facility], Medication Assistance  Post Acute Care Choice:  Home Health Choice offered to:     DME Arranged:    DME Agency:     HH Arranged:    HH Agency:     Status of Service:  In process, will continue to follow  If discussed at Long Length of Stay Meetings, dates discussed:    Additional Comments:  Durenda GuthrieBrady, Jamie Ailey Naomi, RN 07/19/2017, 1:32 PM

## 2017-07-19 NOTE — Discharge Summary (Signed)
[facility] (OTS)  Patient ID: JOURNII NIERMAN MRN: [id number] DOB/AGE: [birth year] 31 y.o.  Admit date: 07/17/2017 Discharge date: 07/19/2017  Admission Diagnoses: Abscess Left hand HTN IVDU Chronic hep c   Discharge Diagnoses:  Principal Problem:   Abscess, hand Active Problems:   Hypertension   IVDU (intravenous drug user)   Cocaine abuse   Heroin abuse   Hepatitis C, chronic (HCC)   Procedures Performed: 07/17/2017- Dr. Carola Frost  Irrigation and debridement of left hand abscess with and including excision of tenosynovium   Discharged Condition: good  Hospital Course:   Admitted for abscess dorsum L hand  OR for I&D   Admitted for observation, therapy, abx, hydrotherapy  Stable for dc on POD 2  No acute issues of note   Discharged in stable condition   Consults: None  Significant Diagnostic Studies: labs:   Results for TERESSA, MCGLOCKLIN (MRN [id number]) as of 07/19/2017 13:22  Ref. Range 07/17/2017 16:21  Amphetamines Latest Ref Range: NONE DETECTED  NONE DETECTED  Barbiturates Latest Ref Range: NONE DETECTED  NONE DETECTED  Benzodiazepines Latest Ref Range: NONE DETECTED  POSITIVE (A)  Opiates Latest Ref Range: NONE DETECTED  POSITIVE (A)  COCAINE Latest Ref Range: NONE DETECTED  POSITIVE (A)  Tetrahydrocannabinol Latest Ref Range: NONE DETECTED  POSITIVE (A)   Aerobic/Anaerobic Culture (surgical/deep wound)  Order: [id number]  Status:  Preliminary result Visible to patient:  No (Not Released) Next appt:  07/25/2017 at 01:30 PM in Internal Medicine ([facility])  Component 2d ago  Specimen Description ABSCESS   Special Requests NONE   Gram Stain ABUNDANT WBC PRESENT, PREDOMINANTLY PMN  FEW GRAM POSITIVE COCCI IN CLUSTERS      Culture ABUNDANT METHICILLIN RESISTANT STAPHYLOCOCCUS AUREUS  NO ANAEROBES ISOLATED; CULTURE IN PROGRESS FOR 5 DAYS      Report Status PENDING   Organism ID, Bacteria METHICILLIN RESISTANT STAPHYLOCOCCUS AUREUS    Resulting Agency [facility]  Susceptibility    Methicillin resistant staphylococcus aureus    MIC    CIPROFLOXACIN <=0.5 SENSITIVE "><=0.5 SENSI... Sensitive    CLINDAMYCIN <=0.25 SENSITIVE "><=0.25 SENS... Sensitive    ERYTHROMYCIN >=8 RESISTANT  Resistant    GENTAMICIN <=0.5 SENSITIVE "><=0.5 SENSI... Sensitive    Inducible Clindamycin NEGATIVE  Sensitive    OXACILLIN >=4 RESISTANT  Resistant    RIFAMPIN <=0.5 SENSITIVE "><=0.5 SENSI... Sensitive    TETRACYCLINE <=1 SENSITIVE "><=1 SENSITIVE  Sensitive    TRIMETH/SULFA <=10 SENSITIVE "><=10 SENSIT... Sensitive    VANCOMYCIN <=0.5 SENSITIVE "><=0.5 SENSI... Sensitive         Susceptibility Comments   Methicillin resistant staphylococcus aureus  ABUNDANT METHICILLIN RESISTANT STAPHYLOCOCCUS AUREUS         Results for LANIA, ZAWISTOWSKI (MRN [id number]) as of 07/19/2017 13:22  Ref. Range 07/19/2017 04:37  WBC Latest Ref Range: 4.0 - 10.5 K/uL 5.8  RBC Latest Ref Range: 3.87 - 5.11 MIL/uL 3.78 (L)  Hemoglobin Latest Ref Range: 12.0 - 15.0 g/dL 63.8 (L)  HCT Latest Ref Range: 36.0 - 46.0 % 34.0 (L)  MCV Latest Ref Range: 78.0 - 100.0 fL 89.9  MCH Latest Ref Range: 26.0 - 34.0 pg 29.1  MCHC Latest Ref Range: 30.0 - 36.0 g/dL 75.6  RDW Latest Ref Range: 11.5 - 15.5 % 13.6  Platelets Latest Ref Range: 150 - 400 K/uL 267   Treatments: IV hydration, antibiotics: vancomycin, analgesia: methadone, ibuprofen, morphine, therapies: PT and RN and surgery: as above   Discharge Exam:   Orthopedic Trauma Service Progress  Note    Patient ID: CIANNA KASPARIAN MRN: [id number] DOB/AGE: [birth year] 31 y.o.   Subjective:   Doing fine Ready to go home Hydrotherapy completed for today, new packing placed PT has reviewed wound care as well   No other complaint   Pt very anxious    ROS As above    Objective:    VITALS:         Vitals:    07/18/17 0725 07/18/17 1436 07/18/17 2057 07/19/17 0559  BP: 106/64 114/63 115/65 115/79  Pulse: 73  85 85 81  Resp: 16 16 18 20   Temp: 97.6 F (36.4 C) 98.2 F (36.8 C) 97.8 F (36.6 C) (!) 97.4 F (36.3 C)  TempSrc: Oral Oral Oral Oral  SpO2: 100% 98% 100% 100%      Estimated body mass index is 22.46 kg/m as calculated from the following:   Height as of 05/18/15: 5' (1.524 m).   Weight as of 05/18/15: 52.2 kg (115 lb).     Intake/Output      09/03 0701 - 09/04 0700 09/04 0701 - 09/05 0700   P.O. 480 240   Total Intake 480 240   Net +480 +240        Urine Occurrence 3 x       LABS   Lab Results Last 24 Hours       Results for orders placed or performed during the hospital encounter of 07/17/17 (from the past 24 hour(s))  CBC     Status: Abnormal    Collection Time: 07/19/17  4:37 AM  Result Value Ref Range    WBC 5.8 4.0 - 10.5 K/uL    RBC 3.78 (L) 3.87 - 5.11 MIL/uL    Hemoglobin 11.0 (L) 12.0 - 15.0 g/dL    HCT 40.9 (L) 81.1 - 46.0 %    MCV 89.9 78.0 - 100.0 fL    MCH 29.1 26.0 - 34.0 pg    MCHC 32.4 30.0 - 36.0 g/dL    RDW 91.4 78.2 - 95.6 %    Platelets 267 150 - 400 K/uL      Aerobic/Anaerobic Culture (surgical/deep wound)  Order: [id number]  Status:  Preliminary result   Visible to patient:  No (Not Released) Next appt:  None      Component 2d ago    Specimen Description ABSCESS   Special Requests NONE   Gram Stain ABUNDANT WBC PRESENT, PREDOMINANTLY PMN  FEW GRAM POSITIVE COCCI IN CLUSTERS      Culture ABUNDANT METHICILLIN RESISTANT STAPHYLOCOCCUS AUREUS  NO ANAEROBES ISOLATED; CULTURE IN PROGRESS FOR 5 DAYS      Report Status PENDING   Organism ID, Bacteria METHICILLIN RESISTANT STAPHYLOCOCCUS AUREUS   Resulting Agency [facility]  Susceptibility          Methicillin resistant staphylococcus aureus      MIC      CIPROFLOXACIN <=0.5 SENSI... Sensitive      CLINDAMYCIN <=0.25 SENS... Sensitive      ERYTHROMYCIN >=8 RESISTANT  Resistant      GENTAMICIN <=0.5 SENSI... Sensitive      Inducible Clindamycin NEGATIVE  Sensitive      OXACILLIN >=4  RESISTANT  Resistant      RIFAMPIN <=0.5 SENSI... Sensitive      TETRACYCLINE <=1 SENSITIVE  Sensitive      TRIMETH/SULFA <=10 SENSIT... Sensitive      VANCOMYCIN <=0.5 SENSI... Sensitive               Susceptibility Comments  Methicillin resistant staphylococcus aureus  ABUNDANT METHICILLIN RESISTANT STAPHYLOCOCCUS AUREUS     Specimen Collected: 07/17/17 18:00 Last Resulted: 07/19/17 10:34                 PHYSICAL EXAM:    Gen: sitting up in bed, NAD, eating lunch  Lungs: breathing unlabored Cardiac: regular  Ext:       Left Upper extremity              Dressing L hand c/d/i             Ext warm             Motor and sensory functions intact   Assessment/Plan: 2 Days Post-Op    Principal Problem:   Abscess, hand Active Problems:   Hypertension   IVDU (intravenous drug user)   Cocaine abuse   Heroin abuse   Hepatitis C, chronic (HCC)       POD/HD#:    31 y/o female with abscess dorsum Left hand    - L hand abscess             Dc home today: pt afebrile, normal WBC count              Daily packing changes             Follow up on 07/25/2017             Doxycycline 100 mg po BID x 14 days              Ice and elevate as needed   - polysubstance abuse/ methadone tx             Will dc home with 3 days norco for acute pain             Also rx for ketorolac    - Dispo:             Dc home today             Follow up in 7 days at office      Disposition: 01-Home or Self Care  Discharge Instructions    Call MD / Call 911    Complete by:  As directed    If you experience chest pain or shortness of breath, CALL 911 and be transported to the hospital emergency room.  If you develope a fever above 101 F, pus (white drainage) or increased drainage or redness at the wound, or calf pain, call your surgeon's office.   Constipation Prevention    Complete by:  As directed    Drink plenty of fluids.  Prune juice may be helpful.  You may use a stool softener, such  as Colace (over the counter) 100 mg twice a day.  Use MiraLax (over the counter) for constipation as needed.   Diet general    Complete by:  As directed    Discharge instructions    Complete by:  As directed    Orthopaedic Trauma Service Discharge Instructions   General Discharge Instructions  WEIGHT BEARING STATUS: Weightbearing as tolerated through left hand and wrist  RANGE OF MOTION/ACTIVITY: as tolerated  Wound Care: daily packing changes to L hand wound. Use packing to fill wound. Cover with gauze and ace wrap. Change daily. Keep wound clean    Diet: as you were eating previously.  Can use over the counter stool softeners and bowel preparations, such as Miralax, to help with bowel movements.  Narcotics can  be constipating.  Be sure to drink plenty of fluids  PAIN MEDICATION USE AND EXPECTATIONS  You have likely been given narcotic medications to help control your pain.  After a traumatic event that results in an fracture (broken bone) with or without surgery, it is ok to use narcotic pain medications to help control one's pain.  We understand that everyone responds to pain differently and each individual patient will be evaluated on a regular basis for the continued need for narcotic medications. Ideally, narcotic medication use should last no more than 6-8 weeks (coinciding with fracture healing).   As a patient it is your responsibility as well to monitor narcotic medication use and report the amount and frequency you use these medications when you come to your office visit.   We would also advise that if you are using narcotic medications, you should take a dose prior to therapy to maximize you participation.  IF YOU ARE ON NARCOTIC MEDICATIONS IT IS NOT PERMISSIBLE TO OPERATE A MOTOR VEHICLE (MOTORCYCLE/CAR/TRUCK/MOPED) OR HEAVY MACHINERY DO NOT MIX NARCOTICS WITH OTHER CNS (CENTRAL NERVOUS SYSTEM) DEPRESSANTS SUCH AS ALCOHOL   STOP SMOKING OR USING NICOTINE PRODUCTS!!!!  As  discussed nicotine severely impairs your body's ability to heal surgical and traumatic wounds but also impairs bone healing.  Wounds and bone heal by forming microscopic blood vessels (angiogenesis) and nicotine is a vasoconstrictor (essentially, shrinks blood vessels).  Therefore, if vasoconstriction occurs to these microscopic blood vessels they essentially disappear and are unable to deliver necessary nutrients to the healing tissue.  This is one modifiable factor that you can do to dramatically increase your chances of healing your injury.    (This means no smoking, no nicotine gum, patches, etc)  DO NOT USE NONSTEROIDAL ANTI-INFLAMMATORY DRUGS (NSAID'S)  Using products such as Advil (ibuprofen), Aleve (naproxen), Motrin (ibuprofen) for additional pain control during fracture healing can delay and/or prevent the healing response.  If you would like to take over the counter (OTC) medication, Tylenol (acetaminophen) is ok.  However, some narcotic medications that are given for pain control contain acetaminophen as well. Therefore, you should not exceed more than 4000 mg of tylenol in a day if you do not have liver disease.  Also note that there are may OTC medicines, such as cold medicines and allergy medicines that my contain tylenol as well.  If you have any questions about medications and/or interactions please ask your doctor/PA or your pharmacist.      ICE AND ELEVATE INJURED/OPERATIVE EXTREMITY  Using ice and elevating the injured extremity above your heart can help with swelling and pain control.  Icing in a pulsatile fashion, such as 20 minutes on and 20 minutes off, can be followed.    Do not place ice directly on skin. Make sure there is a barrier between to skin and the ice pack.    Using frozen items such as frozen peas works well as the conform nicely to the are that needs to be iced.  USE AN ACE WRAP OR TED HOSE FOR SWELLING CONTROL  In addition to icing and elevation, Ace wraps or TED  hose are used to help limit and resolve swelling.  It is recommended to use Ace wraps or TED hose until you are informed to stop.    When using Ace Wraps start the wrapping distally (farthest away from the body) and wrap proximally (closer to the body)   Example: If you had surgery on your leg or thing and you do  not have a splint on, start the ace wrap at the toes and work your way up to the thigh        If you had surgery on your upper extremity and do not have a splint on, start the ace wrap at your fingers and work your way up to the upper arm  IF YOU ARE IN A SPLINT OR CAST DO NOT REMOVE IT FOR ANY REASON   If your splint gets wet for any reason please contact the office immediately. You may shower in your splint or cast as long as you keep it dry.  This can be done by wrapping in a cast cover or garbage back (or similar)  Do Not stick any thing down your splint or cast such as pencils, money, or hangers to try and scratch yourself with.  If you feel itchy take benadryl as prescribed on the bottle for itching  IF YOU ARE IN A CAM BOOT (BLACK BOOT)  You may remove boot periodically. Perform daily dressing changes as noted below.  Wash the liner of the boot regularly and wear a sock when wearing the boot. It is recommended that you sleep in the boot until told otherwise  CALL THE OFFICE WITH ANY QUESTIONS OR CONCERNS: [phone]   Increase activity slowly as tolerated    Complete by:  As directed      Allergies as of 07/19/2017      Reactions   Amoxicillin Other (See Comments)   diarrhea   Penicillins Cross Reactors Hives, Nausea And Vomiting, Swelling   Patient experienced NO swelling of airways or SOB.  Has tolerated cephalosporins multiple times in the past Has patient had a PCN reaction causing immediate rash, facial/tongue/throat swelling, SOB or lightheadedness with hypotension: no Has patient had a PCN reaction causing severe rash involving mucus membranes or skin necrosis: no Has  patient had a PCN reaction that required hospitalization: no Has patient had a PCN reaction occurring within the last 10 years: no If all of the above answers are       Medication List    STOP taking these medications   ibuprofen 200 MG tablet Commonly known as:  ADVIL,MOTRIN     TAKE these medications   acetaminophen 500 MG tablet Commonly known as:  TYLENOL Take 2 tablets (1,000 mg total) by mouth every 6 (six) hours as needed.   diazepam 2 MG tablet Commonly known as:  VALIUM Take 1 mg by mouth every 6 (six) hours as needed for anxiety.   dicyclomine 20 MG tablet Commonly known as:  BENTYL Take 1 tablet (20 mg total) by mouth 3 (three) times daily before meals. As needed for abdominal cramping   doxycycline 100 MG tablet Commonly known as:  VIBRA-TABS Take 1 tablet (100 mg total) by mouth every 12 (twelve) hours.   HYDROcodone-acetaminophen 5-325 MG tablet Commonly known as:  NORCO Take 1 tablet by mouth every 8 (eight) hours as needed for moderate pain.   ketorolac 10 MG tablet Commonly known as:  TORADOL Take 1 tablet (10 mg total) by mouth every 6 (six) hours as needed for moderate pain.   lamoTRIgine 150 MG tablet Commonly known as:  LAMICTAL Take 150 mg by mouth daily.   loperamide 2 MG capsule Commonly known as:  IMODIUM Take 1 capsule (2 mg total) by mouth 4 (four) times daily as needed for diarrhea or loose stools.   methadone 10 MG/ML solution Commonly known as:  DOLOPHINE Take 80 mg by  mouth daily.   OLANZapine 20 MG tablet Commonly known as:  ZYPREXA Take 20 mg by mouth at bedtime.   ondansetron 4 MG disintegrating tablet Commonly known as:  ZOFRAN ODT Take 1 tablet (4 mg total) by mouth every 8 (eight) hours as needed for nausea or vomiting.   QUEtiapine 300 MG tablet Commonly known as:  SEROQUEL Take 350 mg by mouth at bedtime.            Discharge Care Instructions        Start     Ordered   07/19/17 0000  doxycycline  (VIBRA-TABS) 100 MG tablet  Every 12 hours    Question:  Supervising Provider  Answer:  HANDY, MICHAEL   07/19/17 1337   07/19/17 0000  HYDROcodone-acetaminophen (NORCO) 5-325 MG tablet  Every 8 hours PRN    Question:  Supervising Provider  Answer:  HANDY, MICHAEL   07/19/17 1337   07/19/17 0000  ketorolac (TORADOL) 10 MG tablet  Every 6 hours PRN    Question:  Supervising Provider  Answer:  Myrene Galas   07/19/17 1337   07/19/17 0000  Call MD / Call 911    Comments:  If you experience chest pain or shortness of breath, CALL 911 and be transported to the hospital emergency room.  If you develope a fever above 101 F, pus (white drainage) or increased drainage or redness at the wound, or calf pain, call your surgeon's office.   07/19/17 1339   07/19/17 0000  Constipation Prevention    Comments:  Drink plenty of fluids.  Prune juice may be helpful.  You may use a stool softener, such as Colace (over the counter) 100 mg twice a day.  Use MiraLax (over the counter) for constipation as needed.   07/19/17 1339   07/19/17 0000  Increase activity slowly as tolerated     07/19/17 1339   07/19/17 0000  Discharge instructions    Comments:  [facility]thopaedic Trauma Service Discharge Instructions   General Discharge Instructions  WEIGHT BEARING STATUS: Weightbearing as tolerated through left hand and wrist  RANGE OF MOTION/ACTIVITY: as tolerated  Wound Care: daily packing changes to L hand wound. Use packing to fill wound. Cover with gauze and ace wrap. Change daily. Keep wound clean    Diet: as you were eating previously.  Can use over the counter stool softeners and bowel preparations, such as Miralax, to help with bowel movements.  Narcotics can be constipating.  Be sure to drink plenty of fluids  PAIN MEDICATION USE AND EXPECTATIONS  You have likely been given narcotic medications to help control your pain.  After a traumatic event that results in an fracture (broken bone) with or without  surgery, it is ok to use narcotic pain medications to help control one's pain.  We understand that everyone responds to pain differently and each individual patient will be evaluated on a regular basis for the continued need for narcotic medications. Ideally, narcotic medication use should last no more than 6-8 weeks (coinciding with fracture healing).   As a patient it is your responsibility as well to monitor narcotic medication use and report the amount and frequency you use these medications when you come to your office visit.   We would also advise that if you are using narcotic medications, you should take a dose prior to therapy to maximize you participation.  IF YOU ARE ON NARCOTIC MEDICATIONS IT IS NOT PERMISSIBLE TO OPERATE A MOTOR VEHICLE (MOTORCYCLE/CAR/TRUCK/MOPED) OR HEAVY MACHINERY  DO NOT MIX NARCOTICS WITH OTHER CNS (CENTRAL NERVOUS SYSTEM) DEPRESSANTS SUCH AS ALCOHOL   STOP SMOKING OR USING NICOTINE PRODUCTS!!!!  As discussed nicotine severely impairs your body's ability to heal surgical and traumatic wounds but also impairs bone healing.  Wounds and bone heal by forming microscopic blood vessels (angiogenesis) and nicotine is a vasoconstrictor (essentially, shrinks blood vessels).  Therefore, if vasoconstriction occurs to these microscopic blood vessels they essentially disappear and are unable to deliver necessary nutrients to the healing tissue.  This is one modifiable factor that you can do to dramatically increase your chances of healing your injury.    (This means no smoking, no nicotine gum, patches, etc)  DO NOT USE NONSTEROIDAL ANTI-INFLAMMATORY DRUGS (NSAID'S)  Using products such as Advil (ibuprofen), Aleve (naproxen), Motrin (ibuprofen) for additional pain control during fracture healing can delay and/or prevent the healing response.  If you would like to take over the counter (OTC) medication, Tylenol (acetaminophen) is ok.  However, some narcotic medications that are given  for pain control contain acetaminophen as well. Therefore, you should not exceed more than 4000 mg of tylenol in a day if you do not have liver disease.  Also note that there are may OTC medicines, such as cold medicines and allergy medicines that my contain tylenol as well.  If you have any questions about medications and/or interactions please ask your doctor/PA or your pharmacist.      ICE AND ELEVATE INJURED/OPERATIVE EXTREMITY  Using ice and elevating the injured extremity above your heart can help with swelling and pain control.  Icing in a pulsatile fashion, such as 20 minutes on and 20 minutes off, can be followed.    Do not place ice directly on skin. Make sure there is a barrier between to skin and the ice pack.    Using frozen items such as frozen peas works well as the conform nicely to the are that needs to be iced.  USE AN ACE WRAP OR TED HOSE FOR SWELLING CONTROL  In addition to icing and elevation, Ace wraps or TED hose are used to help limit and resolve swelling.  It is recommended to use Ace wraps or TED hose until you are informed to stop.    When using Ace Wraps start the wrapping distally (farthest away from the body) and wrap proximally (closer to the body)   Example: If you had surgery on your leg or thing and you do not have a splint on, start the ace wrap at the toes and work your way up to the thigh        If you had surgery on your upper extremity and do not have a splint on, start the ace wrap at your fingers and work your way up to the upper arm  IF YOU ARE IN A SPLINT OR CAST DO NOT REMOVE IT FOR ANY REASON   If your splint gets wet for any reason please contact the office immediately. You may shower in your splint or cast as long as you keep it dry.  This can be done by wrapping in a cast cover or garbage back (or similar)  Do Not stick any thing down your splint or cast such as pencils, money, or hangers to try and scratch yourself with.  If you feel itchy take  benadryl as prescribed on the bottle for itching  IF YOU ARE IN A CAM BOOT (BLACK BOOT)  You may remove boot periodically. Perform daily dressing changes as  noted below.  Wash the liner of the boot regularly and wear a sock when wearing the boot. It is recommended that you sleep in the boot until told otherwise  CALL THE OFFICE WITH ANY QUESTIONS OR CONCERNS: [phone]   07/19/17 1339   07/19/17 0000  Diet general     07/19/17 1339     Follow-up Information    Myrene Galas, MD. Schedule an appointment as soon as possible for a visit on 07/25/2017.   Specialty:  Orthopedic Surgery Contact information: [address] [place] [address] [phone]           Discharge Instructions and Plan:  31 y/o female with abscess dorsum Left hand    - L hand abscess             Dc home today: pt afebrile, normal WBC count              Daily packing changes             Follow up on 07/25/2017             Doxycycline 100 mg po BID x 14 days              Ice and elevate as needed  Keep hand clean    - polysubstance abuse/ methadone tx             Will dc home with 3 days norco for acute pain             Also rx for ketorolac    - Dispo:             Dc home today             Follow up in 7 days at office   Signed:  Mearl Latin, PA-C [facility] [phone] (P) 07/19/2017, 1:40 PM

## 2017-07-19 NOTE — Progress Notes (Signed)
Discharge instructions reviewed with the patient to include medications, Prescriptions, Activity and follow up appointments.  Patient voices understanding to teaching.  Written instructions given to the patient. Ambulatory to the door. [place] [employer] with [place]cher.

## 2017-07-19 NOTE — Progress Notes (Signed)
Physical Therapy Progress Note Patient Details Name: [name] MRN: [id number] DOB: [birth year] Today's Date: 07/19/2017    History of Present Illness 31 year old female with a history of cocaine abuse, IV drug abuse, hepatitis C, prior endocarditis, presents with left hand infection. She states she was bitten by bugs 3 days ago and it has progressively swollen. She states it's gotten to the point where now the pain is severe. Underwent irrigation and debridement of left hand abscess including excision of tenosynovium on 07/17/17.    PT Comments    The focus of this session was education for wound management and packing care. Pt was provided with all necessary supplies and educated on packing/wrapping technique. Pt with difficulty keeping eyes open throughout session and did not appear engaged despite max effort from therapist. At end of session pt reports no questions and states she feels comfortable managing dressing changes at home. RN notified that education complete. Please call if further education is required prior to d/c.    Precautions / Restrictions Precautions Precautions: Fall Precaution Comments: Very unsteady on her feet with decreased safety awareness of IV lines Restrictions Weight Bearing Restrictions: No Other Position/Activity Restrictions: No formal WB ordered for LUE.       Time: 1400-1420 PT Time Calculation (min) (ACUTE ONLY): 20 min  Charges:  $Self Care/Home Management: 8-22                    G Codes:       Conni SlipperLaura Mahitha Hickling, PT, DPT Acute Rehabilitation Services Pager: [phone]    [name] 07/19/2017, 2:36 PM

## 2017-07-19 NOTE — Progress Notes (Signed)
Orthopedic Trauma Service Progress Note   Patient ID: [name] MRN: [id number] DOB/AGE: 1986/10/10 31 y.o.  Subjective:  Doing fine Ready to go home Hydrotherapy completed for today, new packing placed PT has reviewed wound care as well  No other complaint  Pt very anxious   ROS As above   Objective:   VITALS:   Vitals:   07/18/17 0725 07/18/17 1436 07/18/17 2057 07/19/17 0559  BP: 106/64 114/63 115/65 115/79  Pulse: 73 85 85 81  Resp: 16 16 18 20   Temp: 97.6 F (36.4 C) 98.2 F (36.8 C) 97.8 F (36.6 C) (!) 97.4 F (36.3 C)  TempSrc: Oral Oral Oral Oral  SpO2: 100% 98% 100% 100%    Estimated body mass index is 22.46 kg/m as calculated from the following:   Height as of 05/18/15: 5' (1.524 m).   Weight as of 05/18/15: 52.2 kg (115 lb).   Intake/Output      09/03 0701 - 09/04 0700 09/04 0701 - 09/05 0700   P.O. 480 240   Total Intake 480 240   Net +480 +240        Urine Occurrence 3 x      LABS  Results for orders placed or performed during the hospital encounter of 07/17/17 (from the past 24 hour(s))  CBC     Status: Abnormal   Collection Time: 07/19/17  4:37 AM  Result Value Ref Range   WBC 5.8 4.0 - 10.5 K/uL   RBC 3.78 (L) 3.87 - 5.11 MIL/uL   Hemoglobin 11.0 (L) 12.0 - 15.0 g/dL   HCT 40.9 (L) 81.1 - 91.4 %   MCV 89.9 78.0 - 100.0 fL   MCH 29.1 26.0 - 34.0 pg   MCHC 32.4 30.0 - 36.0 g/dL   RDW 78.2 95.6 - 21.3 %   Platelets 267 150 - 400 K/uL   Aerobic/Anaerobic Culture (surgical/deep wound)  Order: [id number]  Status:  Preliminary result   Visible to patient:  No (Not Released) Next appt:  None  Component 2d ago   Specimen Description ABSCESS   Special Requests NONE   Gram Stain ABUNDANT WBC PRESENT, PREDOMINANTLY PMN  FEW GRAM POSITIVE COCCI IN CLUSTERS      Culture ABUNDANT METHICILLIN RESISTANT STAPHYLOCOCCUS AUREUS  NO ANAEROBES ISOLATED; CULTURE IN PROGRESS FOR 5 DAYS      Report Status PENDING    Organism ID, Bacteria METHICILLIN RESISTANT STAPHYLOCOCCUS AUREUS   Resulting Agency [facility]  Susceptibility   Methicillin resistant staphylococcus aureus    MIC    CIPROFLOXACIN <=0.5 SENSI... Sensitive    CLINDAMYCIN <=0.25 SENS... Sensitive    ERYTHROMYCIN >=8 RESISTANT  Resistant    GENTAMICIN <=0.5 SENSI... Sensitive    Inducible Clindamycin NEGATIVE  Sensitive    OXACILLIN >=4 RESISTANT  Resistant    RIFAMPIN <=0.5 SENSI... Sensitive    TETRACYCLINE <=1 SENSITIVE  Sensitive    TRIMETH/SULFA <=10 SENSIT... Sensitive    VANCOMYCIN <=0.5 SENSI... Sensitive         Susceptibility Comments  Methicillin resistant staphylococcus aureus  ABUNDANT METHICILLIN RESISTANT STAPHYLOCOCCUS AUREUS    Specimen Collected: 07/17/17 18:00 Last Resulted: 07/19/17 10:34            PHYSICAL EXAM:   Gen: sitting up in bed, NAD, eating lunch  Lungs: breathing unlabored Cardiac: regular  Ext:       Left Upper extremity   Dressing L hand c/d/i  Ext warm  Motor and sensory functions intact  Assessment/Plan: 2 Days Post-Op  Principal Problem:   Abscess, hand Active Problems:   Hypertension   IVDU (intravenous drug user)   Cocaine abuse   Heroin abuse   Hepatitis C, chronic (HCC)    POD/HD#:   31 y/o female with abscess dorsum Left hand   - L hand abscess  Dc home today: pt afebrile, normal WBC count   Daily packing changes  Follow up on 07/25/2017  Doxycycline 100 mg po BID x 14 days   Ice and elevate as needed  - polysubstance abuse/ methadone tx  Will dc home with 3 days norco for acute pain  Also rx for ketorolac   - Dispo:  Dc home today  Follow up in 7 days at office     [name], PA-C [facility] [phone] (P) [phone] (O) 07/19/2017, 1:34 PM

## 2017-07-19 NOTE — Progress Notes (Signed)
Occupational Therapy Treatment Patient Details Name: [name] MRN: [id number] DOB: 1986-01-05 Today's Date: 07/19/2017    History of present illness 31 year old female with a history of cocaine abuse, IV drug abuse, hepatitis C, prior endocarditis, presents with left hand infection. She states she was bitten by bugs 3 days ago and it has progressively swollen. She states it's gotten to the point where now the pain is severe. Underwent irrigation and debridement of left hand abscess including excision of tenosynovium on 07/17/17.   OT comments  Pt progressing towards goals, completing room level functional mobility and ADLs with overall minguard assist. Pt continues to demonstrate impulsivity and decreased safety awareness throughout requiring verbal safety cues and close guard for safety during task completion. Educated Pt on digit ROM, edema reduction. Will continue to follow acutely to safely progress Pt's UE functioning and completion of ADLs and functional mobility.    Follow Up Recommendations  No OT follow up;Supervision/Assistance - 24 hour;Other (comment)    Equipment Recommendations  None recommended by OT          Precautions / Restrictions Precautions Precautions: Fall Precaution Comments: Very unsteady on her feet with decreased safety awareness of IV lines Restrictions Weight Bearing Restrictions: No Other Position/Activity Restrictions: No formal WB ordered for LUE.        Mobility Bed Mobility Overal bed mobility: Needs Assistance Bed Mobility: Supine to Sit     Supine to sit: Min guard     General bed mobility comments: Min Guard for safety due to impulsivity  Transfers Overall transfer level: Needs assistance Equipment used: None Transfers: Sit to/from Stand Sit to Stand: Min guard         General transfer comment: MinGuard and verbal cues for safety as Pt is impulsive    Balance Overall balance assessment: Needs assistance Sitting-balance  support: No upper extremity supported;Feet supported Sitting balance-Leahy Scale: Fair     Standing balance support: No upper extremity supported;During functional activity Standing balance-Leahy Scale: Fair Standing balance comment: Pt maintains static standing and completes room level functional mobility with close guard for safety, slightly unsteady during mobility though no LOB noted                            ADL either performed or assessed with clinical judgement   ADL Overall ADL's : Needs assistance/impaired     Grooming: Brushing hair;Set up;Bed level               Lower Body Dressing: Min guard;Sit to/from stand Lower Body Dressing Details (indicate cue type and reason): donning shorts  Toilet Transfer: Min guard;Ambulation;Regular Social worker and Hygiene: Min guard;Sit to/from stand Toileting - Clothing Manipulation Details (indicate cue type and reason): Close guard for safety due to impulsivity     Functional mobility during ADLs: Min guard General ADL Comments: Pt continues to demonstrate impulsivity and decreased safety awareness, requiring verbal safety cues throughout, completes ADLs with close guard for safety throughout due to impulsive behaviors; educated on digit ROM in LUE;                       Cognition Arousal/Alertness: Awake/alert Behavior During Therapy: Restless;Impulsive Overall Cognitive Status: No family/caregiver present to determine baseline cognitive functioning  General Comments: Pt impulsive and has decreased attention and awareness. Unsure whether this is Pt's baseline.         Exercises General Exercises - Upper Extremity Digit Composite Flexion: AROM;10 reps;Left;Supine Composite Extension: AROM;10 reps;Left;Supine          General Comments L hand ace wrap in place     Pertinent Vitals/ Pain       Pain Assessment: Faces Faces  Pain Scale: Hurts little more Pain Location: L hand Pain Descriptors / Indicators: Constant;Discomfort;Grimacing;Guarding Pain Intervention(s): Monitored during session;Repositioned                                                          Frequency  Min 3X/week        Progress Toward Goals  OT Goals(current goals can now be found in the care plan section)  Progress towards OT goals: Progressing toward goals  Acute Rehab OT Goals Patient Stated Goal: Stop pain OT Goal Formulation: With patient Time For Goal Achievement: 08/01/17 Potential to Achieve Goals: Good  Plan Discharge plan remains appropriate                      AM-PAC PT "6 Clicks" Daily Activity     Outcome Measure   Help from another person eating meals?: A Little Help from another person taking care of personal grooming?: A Little Help from another person toileting, which includes using toliet, bedpan, or urinal?: A Little Help from another person bathing (including washing, rinsing, drying)?: A Lot Help from another person to put on and taking off regular upper body clothing?: A Little Help from another person to put on and taking off regular lower body clothing?: A Little 6 Click Score: 17    End of Session    OT Visit Diagnosis: Unsteadiness on feet (R26.81);Other abnormalities of gait and mobility (R26.89);Pain Pain - part of body: Hand   Activity Tolerance Patient tolerated treatment well   Patient Left in bed;with call bell/phone within reach;with nursing/sitter in room   Nurse Communication Mobility status        Time: [id number] OT Time Calculation (min): 20 min  Charges: OT General Charges $OT Visit: 1 Visit OT Treatments $Self Care/Home Management : 8-22 mins  Marcy SirenBreanna Aynsley Fleet, OT Pager [phone] 07/19/2017    [name] 07/19/2017, 2:46 PM

## 2017-07-22 LAB — AEROBIC/ANAEROBIC CULTURE (SURGICAL/DEEP WOUND)

## 2017-07-22 LAB — AEROBIC/ANAEROBIC CULTURE W GRAM STAIN (SURGICAL/DEEP WOUND)

## 2017-07-25 ENCOUNTER — Inpatient Hospital Stay: Payer: No Typology Code available for payment source

## 2017-11-15 DEATH — deceased
